# Patient Record
Sex: Male | Born: 1955 | Race: White | Hispanic: No | Marital: Married | Smoking: Former smoker
Health system: Southern US, Community
[De-identification: ages and names within clinical notes are randomized; demographics above are authoritative.]

## PROBLEM LIST (undated history)

## (undated) DIAGNOSIS — R059 Cough, unspecified: Secondary | ICD-10-CM

## (undated) DIAGNOSIS — F419 Anxiety disorder, unspecified: Secondary | ICD-10-CM

## (undated) DIAGNOSIS — M1990 Unspecified osteoarthritis, unspecified site: Secondary | ICD-10-CM

## (undated) DIAGNOSIS — M542 Cervicalgia: Secondary | ICD-10-CM

## (undated) DIAGNOSIS — I10 Essential (primary) hypertension: Secondary | ICD-10-CM

## (undated) DIAGNOSIS — K219 Gastro-esophageal reflux disease without esophagitis: Secondary | ICD-10-CM

## (undated) DIAGNOSIS — Z79891 Long term (current) use of opiate analgesic: Secondary | ICD-10-CM

## (undated) DIAGNOSIS — F329 Major depressive disorder, single episode, unspecified: Secondary | ICD-10-CM

## (undated) DIAGNOSIS — G5600 Carpal tunnel syndrome, unspecified upper limb: Secondary | ICD-10-CM

## (undated) DIAGNOSIS — K635 Polyp of colon: Secondary | ICD-10-CM

## (undated) DIAGNOSIS — M5126 Other intervertebral disc displacement, lumbar region: Secondary | ICD-10-CM

## (undated) DIAGNOSIS — R05 Cough: Secondary | ICD-10-CM

## (undated) DIAGNOSIS — C801 Malignant (primary) neoplasm, unspecified: Secondary | ICD-10-CM

## (undated) DIAGNOSIS — R42 Dizziness and giddiness: Secondary | ICD-10-CM

## (undated) DIAGNOSIS — Z978 Presence of other specified devices: Secondary | ICD-10-CM

## (undated) DIAGNOSIS — F32A Depression, unspecified: Secondary | ICD-10-CM

## (undated) DIAGNOSIS — Z79890 Hormone replacement therapy (postmenopausal): Secondary | ICD-10-CM

## (undated) DIAGNOSIS — I499 Cardiac arrhythmia, unspecified: Secondary | ICD-10-CM

## (undated) DIAGNOSIS — G709 Myoneural disorder, unspecified: Secondary | ICD-10-CM

## (undated) DIAGNOSIS — E039 Hypothyroidism, unspecified: Secondary | ICD-10-CM

## (undated) DIAGNOSIS — G8929 Other chronic pain: Secondary | ICD-10-CM

## (undated) DIAGNOSIS — E119 Type 2 diabetes mellitus without complications: Secondary | ICD-10-CM

## (undated) HISTORY — PX: VASECTOMY: SHX75

## (undated) HISTORY — PX: COLON SURGERY: SHX602

## (undated) HISTORY — DX: Polyp of colon: K63.5

## (undated) HISTORY — PX: HERNIA REPAIR: SHX51

## (undated) HISTORY — DX: Presence of other specified devices: Z97.8

## (undated) HISTORY — PX: IMAGE GUIDED SINUS SURGERY: SHX6570

## (undated) HISTORY — DX: Malignant (primary) neoplasm, unspecified: C80.1

---

## 1994-12-02 DIAGNOSIS — G8929 Other chronic pain: Secondary | ICD-10-CM

## 1994-12-02 HISTORY — DX: Other chronic pain: G89.29

## 2011-12-03 HISTORY — PX: LAPAROSCOPIC CHOLECYSTECTOMY: SUR755

## 2011-12-03 HISTORY — PX: UMBILICAL HERNIA REPAIR: SHX196

## 2011-12-03 HISTORY — PX: COLONOSCOPY: SHX174

## 2014-12-02 HISTORY — PX: OTHER SURGICAL HISTORY: SHX169

## 2016-10-17 DIAGNOSIS — I10 Essential (primary) hypertension: Secondary | ICD-10-CM | POA: Insufficient documentation

## 2016-10-17 DIAGNOSIS — K635 Polyp of colon: Secondary | ICD-10-CM | POA: Insufficient documentation

## 2016-10-17 DIAGNOSIS — E039 Hypothyroidism, unspecified: Secondary | ICD-10-CM | POA: Insufficient documentation

## 2016-10-17 DIAGNOSIS — E1142 Type 2 diabetes mellitus with diabetic polyneuropathy: Secondary | ICD-10-CM | POA: Insufficient documentation

## 2016-10-17 DIAGNOSIS — G894 Chronic pain syndrome: Secondary | ICD-10-CM | POA: Insufficient documentation

## 2017-04-21 ENCOUNTER — Encounter: Payer: Self-pay | Admitting: *Deleted

## 2017-04-22 ENCOUNTER — Ambulatory Visit: Payer: Federal, State, Local not specified - PPO | Admitting: Certified Registered Nurse Anesthetist

## 2017-04-22 ENCOUNTER — Encounter
Admission: RE | Disposition: A | Discharge status: Home / Self Care | Payer: Self-pay | Source: Ambulatory Visit | Attending: Gastroenterology

## 2017-04-22 ENCOUNTER — Ambulatory Visit
Admission: RE | Admit: 2017-04-22 | Discharge: 2017-04-22 | Disposition: A | Discharge status: Home / Self Care | Payer: Federal, State, Local not specified - PPO | Source: Ambulatory Visit | Attending: Gastroenterology | Admitting: Gastroenterology

## 2017-04-22 ENCOUNTER — Encounter: Payer: Self-pay | Admitting: Anesthesiology

## 2017-04-22 DIAGNOSIS — F329 Major depressive disorder, single episode, unspecified: Secondary | ICD-10-CM | POA: Diagnosis not present

## 2017-04-22 DIAGNOSIS — D122 Benign neoplasm of ascending colon: Secondary | ICD-10-CM | POA: Insufficient documentation

## 2017-04-22 DIAGNOSIS — F419 Anxiety disorder, unspecified: Secondary | ICD-10-CM | POA: Insufficient documentation

## 2017-04-22 DIAGNOSIS — D125 Benign neoplasm of sigmoid colon: Secondary | ICD-10-CM | POA: Diagnosis not present

## 2017-04-22 DIAGNOSIS — D124 Benign neoplasm of descending colon: Secondary | ICD-10-CM | POA: Insufficient documentation

## 2017-04-22 DIAGNOSIS — K219 Gastro-esophageal reflux disease without esophagitis: Secondary | ICD-10-CM | POA: Diagnosis not present

## 2017-04-22 DIAGNOSIS — E119 Type 2 diabetes mellitus without complications: Secondary | ICD-10-CM | POA: Insufficient documentation

## 2017-04-22 DIAGNOSIS — Z87891 Personal history of nicotine dependence: Secondary | ICD-10-CM | POA: Diagnosis not present

## 2017-04-22 DIAGNOSIS — M1990 Unspecified osteoarthritis, unspecified site: Secondary | ICD-10-CM | POA: Insufficient documentation

## 2017-04-22 DIAGNOSIS — D127 Benign neoplasm of rectosigmoid junction: Secondary | ICD-10-CM | POA: Insufficient documentation

## 2017-04-22 DIAGNOSIS — Z1211 Encounter for screening for malignant neoplasm of colon: Secondary | ICD-10-CM | POA: Insufficient documentation

## 2017-04-22 DIAGNOSIS — K5730 Diverticulosis of large intestine without perforation or abscess without bleeding: Secondary | ICD-10-CM | POA: Insufficient documentation

## 2017-04-22 DIAGNOSIS — I499 Cardiac arrhythmia, unspecified: Secondary | ICD-10-CM | POA: Diagnosis not present

## 2017-04-22 DIAGNOSIS — Z86010 Personal history of colonic polyps: Secondary | ICD-10-CM | POA: Diagnosis not present

## 2017-04-22 DIAGNOSIS — Z79899 Other long term (current) drug therapy: Secondary | ICD-10-CM | POA: Insufficient documentation

## 2017-04-22 DIAGNOSIS — Z881 Allergy status to other antibiotic agents status: Secondary | ICD-10-CM | POA: Insufficient documentation

## 2017-04-22 DIAGNOSIS — K644 Residual hemorrhoidal skin tags: Secondary | ICD-10-CM | POA: Insufficient documentation

## 2017-04-22 DIAGNOSIS — G8929 Other chronic pain: Secondary | ICD-10-CM | POA: Insufficient documentation

## 2017-04-22 DIAGNOSIS — Z880 Allergy status to penicillin: Secondary | ICD-10-CM | POA: Diagnosis not present

## 2017-04-22 DIAGNOSIS — K648 Other hemorrhoids: Secondary | ICD-10-CM | POA: Insufficient documentation

## 2017-04-22 DIAGNOSIS — Z794 Long term (current) use of insulin: Secondary | ICD-10-CM | POA: Diagnosis not present

## 2017-04-22 DIAGNOSIS — D123 Benign neoplasm of transverse colon: Secondary | ICD-10-CM | POA: Insufficient documentation

## 2017-04-22 HISTORY — DX: Unspecified osteoarthritis, unspecified site: M19.90

## 2017-04-22 HISTORY — DX: Anxiety disorder, unspecified: F41.9

## 2017-04-22 HISTORY — PX: COLONOSCOPY WITH PROPOFOL: SHX5780

## 2017-04-22 HISTORY — DX: Cardiac arrhythmia, unspecified: I49.9

## 2017-04-22 HISTORY — DX: Type 2 diabetes mellitus without complications: E11.9

## 2017-04-22 HISTORY — DX: Depression, unspecified: F32.A

## 2017-04-22 HISTORY — DX: Gastro-esophageal reflux disease without esophagitis: K21.9

## 2017-04-22 HISTORY — DX: Major depressive disorder, single episode, unspecified: F32.9

## 2017-04-22 HISTORY — DX: Other chronic pain: G89.29

## 2017-04-22 LAB — GLUCOSE, CAPILLARY: Glucose-Capillary: 155 mg/dL — ABNORMAL HIGH (ref 65–99)

## 2017-04-22 SURGERY — COLONOSCOPY WITH PROPOFOL
Anesthesia: General

## 2017-04-22 MED ORDER — SODIUM CHLORIDE 0.9 % IV SOLN: INTRAVENOUS | Status: DC

## 2017-04-22 MED ORDER — PROPOFOL 10 MG/ML IV BOLUS
INTRAVENOUS | Status: AC
  Filled 2017-04-22: qty 20

## 2017-04-22 MED ORDER — PROPOFOL 500 MG/50ML IV EMUL
INTRAVENOUS | Status: DC | PRN
  Administered 2017-04-22: 140 ug/kg/min via INTRAVENOUS

## 2017-04-22 MED ORDER — MIDAZOLAM HCL 2 MG/2ML IJ SOLN
INTRAMUSCULAR | Status: DC | PRN
  Administered 2017-04-22: 2 mg via INTRAVENOUS

## 2017-04-22 MED ORDER — PHENYLEPHRINE HCL 10 MG/ML IJ SOLN
INTRAMUSCULAR | Status: AC
  Filled 2017-04-22: qty 1

## 2017-04-22 MED ORDER — LIDOCAINE HCL (CARDIAC) 20 MG/ML IV SOLN
INTRAVENOUS | Status: DC | PRN
  Administered 2017-04-22: 30 mg via INTRAVENOUS

## 2017-04-22 MED ORDER — EPHEDRINE SULFATE 50 MG/ML IJ SOLN
INTRAMUSCULAR | Status: AC
  Filled 2017-04-22: qty 1

## 2017-04-22 MED ORDER — LIDOCAINE HCL 2 % IJ SOLN
INTRAMUSCULAR | Status: AC
  Filled 2017-04-22: qty 10

## 2017-04-22 MED ORDER — PHENYLEPHRINE HCL 10 MG/ML IJ SOLN
INTRAMUSCULAR | Status: DC | PRN
  Administered 2017-04-22: 200 ug via INTRAVENOUS
  Administered 2017-04-22 (×2): 100 ug via INTRAVENOUS
  Administered 2017-04-22: 200 ug via INTRAVENOUS
  Administered 2017-04-22: 100 ug via INTRAVENOUS

## 2017-04-22 MED ORDER — SPOT INK MARKER SYRINGE KIT
PACK | SUBMUCOSAL | Status: DC | PRN
  Administered 2017-04-22: 5 mL via SUBMUCOSAL

## 2017-04-22 MED ORDER — MIDAZOLAM HCL 2 MG/2ML IJ SOLN
INTRAMUSCULAR | Status: AC
  Filled 2017-04-22: qty 2

## 2017-04-22 MED ORDER — PROPOFOL 10 MG/ML IV BOLUS
INTRAVENOUS | Status: DC | PRN
  Administered 2017-04-22: 30 mg via INTRAVENOUS

## 2017-04-22 MED ORDER — SODIUM CHLORIDE 0.9 % IV SOLN
INTRAVENOUS | Status: DC
  Administered 2017-04-22: 07:00:00 via INTRAVENOUS

## 2017-04-22 MED ORDER — PROPOFOL 500 MG/50ML IV EMUL
INTRAVENOUS | Status: AC
  Filled 2017-04-22: qty 50

## 2017-04-22 NOTE — OR Nursing (Signed)
6 ML [place] Ink placed is descending colon to mark Polyp site.

## 2017-04-22 NOTE — Anesthesia Postprocedure Evaluation (Signed)
Anesthesia Post Note  Patient: Stephen Yu  Procedure(s) Performed: Procedure(s) (LRB): COLONOSCOPY WITH PROPOFOL (N/A)  Patient location during evaluation: Endoscopy Anesthesia Type: General Level of consciousness: awake and alert Pain management: pain level controlled Vital Signs Assessment: post-procedure vital signs reviewed and stable Respiratory status: spontaneous breathing, nonlabored ventilation, respiratory function stable and patient connected to nasal cannula oxygen Cardiovascular status: blood pressure returned to baseline and stable Postop Assessment: no signs of nausea or vomiting Anesthetic complications: no     Last Vitals:  Vitals:   04/22/17 0900 04/22/17 0908  BP:  113/73  Pulse: 65 64  Resp: (!) 8 14  Temp:      Last Pain:  Vitals:   04/22/17 0830  TempSrc: Tympanic  PainSc:                  [name] S

## 2017-04-22 NOTE — Anesthesia Post-op Follow-up Note (Cosign Needed)
Anesthesia QCDR form completed.        

## 2017-04-22 NOTE — Anesthesia Procedure Notes (Addendum)
Date/Time: 04/22/2017 7:35 AM Performed by: Johnna Acosta Pre-anesthesia Checklist: Patient identified, Emergency Drugs available, Suction available, Patient being monitored and Timeout performed Patient Re-evaluated:Patient Re-evaluated prior to inductionOxygen Delivery Method: Nasal cannula

## 2017-04-22 NOTE — Anesthesia Preprocedure Evaluation (Signed)
Anesthesia Evaluation  Patient identified by MRN, date of birth, ID band Patient awake    Reviewed: Allergy & Precautions, NPO status , Patient's Chart, lab work & pertinent test results, reviewed documented beta blocker date and time   Airway Mallampati: II  TM Distance: >3 FB     Dental  (+) Chipped   Pulmonary former smoker,           Cardiovascular      Neuro/Psych PSYCHIATRIC DISORDERS Anxiety Depression    GI/Hepatic GERD  ,  Endo/Other  diabetes, Type 2  Renal/GU      Musculoskeletal   Abdominal   Peds  Hematology   Anesthesia Other Findings   Reproductive/Obstetrics                             Anesthesia Physical Anesthesia Plan  ASA: III  Anesthesia Plan: General   Post-op Pain Management:    Induction: Intravenous  Airway Management Planned:   Additional Equipment:   Intra-op Plan:   Post-operative Plan:   Informed Consent: I have reviewed the patients History and Physical, chart, labs and discussed the procedure including the risks, benefits and alternatives for the proposed anesthesia with the patient or authorized representative who has indicated his/her understanding and acceptance.     Plan Discussed with: CRNA  Anesthesia Plan Comments:         Anesthesia Quick Evaluation

## 2017-04-22 NOTE — H&P (Signed)
Outpatient short stay form Pre-procedure 04/22/2017 7:27 AM Lollie Sails MD  Primary Physician: Dr. Fulton Reek  Reason for visit:  Colonoscopy  History of present illness:  Patient is a 61 year old male presenting today as above. He tolerated his prep well. He takes no recent aspirin products. He does not take a prescribed blood thinner. Patient does have a personal history of adenomatous colon polyps.    Current Facility-Administered Medications:  .  0.9 %  sodium chloride infusion, , Intravenous, Continuous, Lollie Sails, MD, Last Rate: 20 mL/hr at 04/22/17 0716 .  0.9 %  sodium chloride infusion, , Intravenous, Continuous, Lollie Sails, MD  Prescriptions Prior to Admission  Medication Sig Dispense Refill Last Dose  . amitriptyline (ELAVIL) 100 MG tablet Take 100 mg by mouth at bedtime.   04/07/2017  . citalopram (CELEXA) 40 MG tablet Take 40 mg by mouth daily.   04/20/2017  . esomeprazole (NEXIUM) 40 MG capsule Take 40 mg by mouth daily at 12 noon.   03/23/2017  . glyBURIDE-metformin (GLUCOVANCE) 5-500 MG tablet Take 1 tablet by mouth daily with breakfast.   04/20/2017  . HYDROcodone-acetaminophen (NORCO) 7.5-325 MG tablet Take 1 tablet by mouth every 6 (six) hours as needed for moderate pain.   04/21/2017  . Insulin Glargine (BASAGLAR KWIKPEN) 100 UNIT/ML SOPN Inject into the skin at bedtime.   04/21/2017 at Unknown time  . lisinopril-hydrochlorothiazide (PRINZIDE,ZESTORETIC) 20-25 MG tablet Take 1 tablet by mouth daily.   04/20/2017  . niacin 500 MG tablet Take 500 mg by mouth at bedtime.   04/20/2017  . omega-3 acid ethyl esters (LOVAZA) 1 g capsule Take 1 g by mouth 2 (two) times daily.     . pregabalin (LYRICA) 150 MG capsule Take 150 mg by mouth 2 (two) times daily.   04/17/2017  . THYROID, PORK, PO Take 32.5 mg by mouth daily.   04/17/2017  . tiZANidine (ZANAFLEX) 4 MG capsule Take 4 mg by mouth 3 (three) times daily.   04/20/2017  . vitamin E (VITAMIN E) 400 UNIT  capsule Take 400 Units by mouth daily.   04/17/2017  . zinc gluconate 50 MG tablet Take 50 mg by mouth daily.   04/17/2017     Allergies  Allergen Reactions  . Biaxin [Clarithromycin]   . Cephalexin   . Penicillins      Past Medical History:  Diagnosis Date  . Anxiety   . Arthritis   . Chronic pain   . Depression   . Diabetes mellitus without complication ([place])   . Dysrhythmia   . GERD (gastroesophageal reflux disease)     Review of systems:      Physical Exam    Heart and lungs: Regular rate and rhythm without rub or gallop, lungs are bilaterally clear.    HEENT: Normocephalic atraumatic eyes are anicteric    Other:     Pertinant exam for procedure: Soft nontender nondistended bowel sounds positive normoactive.    Planned proceedures: Colonoscopy and indicated procedures. I have discussed the risks benefits and complications of procedures to include not limited to bleeding, infection, perforation and the risk of sedation and the patient wishes to proceed.  Lollie Sails, MD Gastroenterology 04/22/2017  7:27 AM

## 2017-04-22 NOTE — Transfer of Care (Signed)
Immediate Anesthesia Transfer of Care Note  Patient: Stephen Yu  Procedure(s) Performed: Procedure(s): COLONOSCOPY WITH PROPOFOL (N/A)  Patient Location: PACU  Anesthesia Type:General  Level of Consciousness: sedated  Airway & Oxygen Therapy: Patient Spontanous Breathing and Patient connected to nasal cannula oxygen  Post-op Assessment: Report given to RN and Post -op Vital signs reviewed and stable  Post vital signs: Reviewed and stable  Last Vitals:  Vitals:   04/22/17 0830 04/22/17 0838  BP:  (!) 99/51  Pulse:  63  Resp:  (!) 8  Temp: 36.2 C     Last Pain:  Vitals:   04/22/17 0830  TempSrc: Tympanic  PainSc:          Complications: No apparent anesthesia complications

## 2017-04-22 NOTE — Op Note (Addendum)
[facility] Patient Name: Stephen Yu Procedure Date: 04/22/2017 7:32 AM MRN: [id number] Account #: [id number] Date of Birth: 04/25/1956 Admit Type: Outpatient Age: 61 Room: [facility] ENDO ROOM 3 Gender: Male Note Status: Finalized Procedure:            Colonoscopy Indications:          Personal history of colonic polyps Providers:            Lollie Sails, MD Referring MD:         Leonie Douglas. Doy Hutching, MD (Referring MD) Medicines:            Monitored Anesthesia Care Complications:        No immediate complications. Procedure:            Pre-Anesthesia Assessment:                       - ASA Grade Assessment: III - A patient with severe                        systemic disease.                       After obtaining informed consent, the colonoscope was                        passed under direct vision. Throughout the procedure,                        the patient's blood pressure, pulse, and oxygen                        saturations were monitored continuously. The                        Colonoscope was introduced through the anus and                        advanced to the the cecum, identified by appendiceal                        orifice and ileocecal valve. The colonoscopy was                        performed with moderate difficulty due to poor bowel                        prep. Successful completion of the procedure was aided                        by lavage. The colonoscopy was performed with moderate                        difficulty due to significant looping. Successful                        completion of the procedure was aided by changing the                        patient to a supine position and changing the patient  to a prone position. The quality of the bowel                        preparation was fair. Findings:      Multiple small and large-mouthed diverticula were found in the sigmoid       colon and  descending colon.      Six sessile polyps were found in the recto-sigmoid colon. The polyps       were 1 to 3 mm in size. These polyps were removed with a cold biopsy       forceps. Resection and retrieval were complete.      Two sessile polyps were found in the transverse colon. The polyps were 4       mm in size. These polyps were removed with a cold snare. Resection and       retrieval were complete.      A 4 mm polyp was found in the ascending colon. The polyp was sessile.       The polyp was removed with a cold snare. Resection and retrieval were       complete.      A localized area of granular mucosa was found at the ileocecal valve.       Biopsies were taken with a cold forceps for histology.      A greater than 50 mm polyp was found in the proximal transverse colon.       The polyp was sessile. Biopsies were taken with a cold forceps for       histology. Area was tattooed with an injection of 4 mL of [place] ink.       Review of phots after the case indicated possible endoloop/band at the       site.      A 6 mm polyp was found in the descending colon. The polyp was sessile.       The polyp was removed with a cold snare. Resection and retrieval were       complete.      A 4 mm polyp was found in the distal descending colon. The polyp was       sessile. The polyp was removed with a cold snare. Resection and       retrieval were complete.      A 2 mm polyp was found in the proximal sigmoid colon. The polyp was       sessile. The polyp was removed with a cold biopsy forceps. Resection and       retrieval were complete.      The digital rectal exam was normal.      The retroflexed view of the distal rectum and anal verge was normal and       showed no anal or rectal abnormalities.      The digital rectal exam was normal.      Non-bleeding external and internal hemorrhoids were found during       anoscopy. The hemorrhoids were small. Impression:           - Preparation of the colon  was fair.                       - Diverticulosis in the sigmoid colon and in the                        descending colon.                       -  Six 1 to 3 mm polyps at the recto-sigmoid colon,                        removed with a cold biopsy forceps. Resected and                        retrieved.                       - Two 4 mm polyps in the transverse colon, removed with                        a cold snare. Resected and retrieved.                       - One 4 mm polyp in the ascending colon, removed with a                        cold snare. Resected and retrieved.                       - Granularity at the ileocecal valve. Biopsied.                       - One greater than 50 mm polyp in the proximal                        transverse colon. Biopsied. Tattooed.                       - One 6 mm polyp in the descending colon, removed with                        a cold snare. Resected and retrieved.                       - One 4 mm polyp in the distal descending colon,                        removed with a cold snare. Resected and retrieved.                       - One 2 mm polyp in the proximal sigmoid colon, removed                        with a cold biopsy forceps. Resected and retrieved.                       - The distal rectum and anal verge are normal on                        retroflexion view.                       - Non-bleeding external and internal hemorrhoids. Recommendation:       - Await pathology results.                       - Return to GI clinic in 2 weeks.                       -  depending on histology, may need surgical                        consultation. Procedure Code(s):    --- Professional ---                       [phone], Colonoscopy, flexible; with removal of tumor(s),                        polyp(s), or other lesion(s) by snare technique                       45380, 80, Colonoscopy, flexible; with biopsy, single                        or multiple                        45381, Colonoscopy, flexible; with directed submucosal                        injection(s), any substance Diagnosis Code(s):    --- Professional ---                       K64.8, Other hemorrhoids                       D12.7, Benign neoplasm of rectosigmoid junction                       D12.3, Benign neoplasm of transverse colon (hepatic                        flexure or splenic flexure)                       D12.2, Benign neoplasm of ascending colon                       D12.4, Benign neoplasm of descending colon                       D12.5, Benign neoplasm of sigmoid colon                       K63.89, Other specified diseases of intestine                       Z86.010, Personal history of colonic polyps                       K57.30, Diverticulosis of large intestine without                        perforation or abscess without bleeding CPT copyright 2016 American Medical Association. All rights reserved. The codes documented in this report are preliminary and upon coder review may  be revised to meet current compliance requirements. Lollie Sails, MD 04/22/2017 8:43:15 AM This report has been signed electronically. Number of Addenda: 0 Note Initiated On: 04/22/2017 7:32 AM Scope Withdrawal Time: 0 hours 34 minutes 57 seconds  Total Procedure Duration: 0 hours 52 minutes 53 seconds       [facility]

## 2017-04-23 LAB — SURGICAL PATHOLOGY

## 2017-04-24 ENCOUNTER — Encounter: Payer: Self-pay | Admitting: Gastroenterology

## 2017-04-29 ENCOUNTER — Other Ambulatory Visit: Payer: Self-pay | Admitting: Neurological Surgery

## 2017-04-30 DIAGNOSIS — J351 Hypertrophy of tonsils: Secondary | ICD-10-CM | POA: Insufficient documentation

## 2017-05-07 ENCOUNTER — Ambulatory Visit (INDEPENDENT_AMBULATORY_CARE_PROVIDER_SITE_OTHER): Payer: Federal, State, Local not specified - PPO | Admitting: General Surgery

## 2017-05-07 ENCOUNTER — Encounter: Payer: Self-pay | Admitting: General Surgery

## 2017-05-07 VITALS — BP 132/72 | HR 82 | Resp 12 | Ht 70.0 in | Wt 226.0 lb

## 2017-05-07 DIAGNOSIS — K429 Umbilical hernia without obstruction or gangrene: Secondary | ICD-10-CM | POA: Diagnosis not present

## 2017-05-07 DIAGNOSIS — D123 Benign neoplasm of transverse colon: Secondary | ICD-10-CM | POA: Diagnosis not present

## 2017-05-07 DIAGNOSIS — D369 Benign neoplasm, unspecified site: Secondary | ICD-10-CM

## 2017-05-07 NOTE — Progress Notes (Signed)
Patient ID: Stephen Yu, male   DOB: October 24, 1956, 61 y.o.   MRN: [id number]  Chief Complaint  Patient presents with  . Colon Polyps    HPI Stephen Yu is a 61 y.o. male.  Here today referred by Dr Donnella Sham for transverse colon polyps. Last colonoscopy was 04-22-17. Bowels area regular, no bleeding. He does have back surgery for spurs scheduled for 05-28-17 with Dr. Sherley Bounds in [place]. Intrathecal pump followed by Dr. Mechele Dawley in [place], [phone]. Dr. Elana Alm in [facility] in [place], [place] or [phone].  HPI  Past Medical History:  Diagnosis Date  . Anxiety   . Arthritis   . Chronic pain 1996   due to work injury  . Colon polyp   . Depression   . Diabetes mellitus without complication ([place])   . Dysrhythmia   . GERD (gastroesophageal reflux disease)   . Presence of intrathecal pump    Dr. Mechele Dawley in [place], [place]    Past Surgical History:  Procedure Laterality Date  . COLONOSCOPY  2013   [place]  . COLONOSCOPY WITH PROPOFOL N/A 04/22/2017   Procedure: COLONOSCOPY WITH PROPOFOL;  Surgeon: Lollie Sails, MD;  Location: [facility] ENDOSCOPY;  Service: Endoscopy;  Laterality: N/A;  . implanted morphine pump  2016  . LAPAROSCOPIC CHOLECYSTECTOMY  2013  . UMBILICAL HERNIA REPAIR  2013    Family History  Problem Relation Age of Onset  . Breast cancer Cousin 42  . Cancer - Colon Neg Hx   . Prostate cancer Neg Hx     Social History Social History  Substance Use Topics  . Smoking status: Former Smoker    Years: 20.00    Types: Cigarettes    Quit date: 12/03/1999  . Smokeless tobacco: Never Used  . Alcohol use No    Allergies  Allergen Reactions  . Biaxin [Clarithromycin] Other (See Comments)    Severe hiccups  . Cephalexin Other (See Comments)    Severe hiccups  . Penicillins Hives    Current Outpatient Prescriptions  Medication Sig Dispense Refill  . amitriptyline (ELAVIL) 100 MG tablet  Take 100 mg by mouth at bedtime as needed.     . Cholecalciferol (VITAMIN D3) 5000 units TABS Take by mouth.    . citalopram (CELEXA) 40 MG tablet Take 40 mg by mouth daily.    Marland Kitchen esomeprazole (NEXIUM) 40 MG capsule Take 40 mg by mouth as needed.     . glyBURIDE-metformin (GLUCOVANCE) 5-500 MG tablet Take 1 tablet by mouth daily with breakfast.    . HYDROcodone-acetaminophen (NORCO) 7.5-325 MG tablet Take 1 tablet by mouth every 6 (six) hours as needed for moderate pain.    . Insulin Glargine (BASAGLAR KWIKPEN) 100 UNIT/ML SOPN Inject 40 Units into the skin daily.     Marland Kitchen KRILL OIL PO Take by mouth daily.    Marland Kitchen lisinopril-hydrochlorothiazide (PRINZIDE,ZESTORETIC) 20-25 MG tablet Take 1 tablet by mouth daily.    . niacin 500 MG tablet Take 500 mg by mouth at bedtime.    Marland Kitchen omega-3 acid ethyl esters (LOVAZA) 1 g capsule Take 1 g by mouth 2 (two) times daily.    . pregabalin (LYRICA) 150 MG capsule Take 150 mg by mouth 2 (two) times daily.    . THYROID, PORK, PO Take 32.5 mg by mouth daily.    Marland Kitchen tiZANidine (ZANAFLEX) 4 MG capsule Take 4 mg by mouth 3 (three) times daily.    . vitamin E (VITAMIN E) 400  UNIT capsule Take 400 Units by mouth daily.    Marland Kitchen zinc gluconate 50 MG tablet Take 50 mg by mouth daily.     No current facility-administered medications for this visit.     Review of Systems Review of Systems  Constitutional: Negative.   Respiratory: Negative.   Cardiovascular: Negative.   Gastrointestinal: Negative for abdominal pain, blood in stool, constipation and diarrhea.    Blood pressure 132/72, pulse 82, resp. rate 12, height 5\' 10"  (1.778 m), weight 226 lb (102.5 kg).  Physical Exam Physical Exam  Constitutional: He is oriented to person, place, and time. He appears well-developed and well-nourished.  Eyes: Conjunctivae are normal. No scleral icterus.  Neck: Neck supple.  Cardiovascular: Normal rate, regular rhythm, normal heart sounds and intact distal pulses.   No lower leg edema   Pulmonary/Chest: Effort normal and breath sounds normal.  Abdominal: Soft. Normal appearance and bowel sounds are normal. A hernia is present.    Lymphadenopathy:    He has no cervical adenopathy.  Neurological: He is alert and oriented to person, place, and time.  Skin: Skin is warm.  Psychiatric: He has a normal mood and affect. His behavior is normal.    Data Reviewed Notes and images reviewed.   Assessment    History of Adenomatous colon polyps- Approx 12 polyps found in most recent colonoscopy done on 04/22/2017. One large polyp in transverse colon not amenable to removal by colonoscope. Biopsy of this showed tubular adenoma, no dysplasia  Will consult with Anesthesia at [facility] to discuss impact of  intrathecal pump intra-operatively. Lap colectomy recommended. Chronic pain- has intrathecal pump, managed by Dr. Mechele Dawley at [facility] One episode of A.fib- stable since Stable exam otherwise    Plan    Options reviewed with patients, patient agreeable with plan, precautions, and risks. Will be scheduled for Laparoscopic Colectomy. Advised to call office with questions or concerns.     HPI, Physical Exam, Assessment and Plan have been scribed under the direction and in the presence of Mckinley Jewel, MD  Karie Fetch, RN  I have completed the exam and reviewed the above documentation for accuracy and completeness.  I agree with the above.  Haematologist has been used and any errors in dictation or transcription are unintentional.  Seeplaputhur G. Jamal Collin, M.D., F.A.C.S.   Junie Panning G 05/08/2017, 9:20 AM

## 2017-05-07 NOTE — Patient Instructions (Addendum)
Options reviewed with patients, patient agreeable with plan, precautions, and risks. Will be scheduled for Laparoscopic Colectomy. Advised to call office with questions or concerns. Laparoscopic Colectomy Laparoscopic colectomy is surgery to remove part or all of the large intestine (colon). This procedure may be used to treat several conditions, including:  Inflammation and infection of the colon (diverticulitis).  Tumors or masses in the colon.  Inflammatory bowel disease, such as Crohn disease or ulcerative colitis. Colectomy is an option when symptoms cannot be controlled with medicines.  Bleeding from the colon that cannot be controlled by another method.  Blockage or obstruction of the colon.  Tell a health care provider about:  Any allergies you have.  All medicines you are taking, including vitamins, herbs, eye drops, creams, and over-the-counter medicines.  Any problems you or family members have had with anesthetic medicines.  Any blood disorders you have.  Any surgeries you have had.  Any medical conditions you have. What are the risks? Generally, this is a safe procedure. However, problems may occur, including:  Infection.  Bleeding.  Allergic reactions to medicines or dyes.  Damage to other structures or organs.  Leaking from where the colon was sewn together.  Future blockage of the small intestines from scar tissue. Another surgery may be needed to repair this.  Needing to convert to an open procedure. Complications such as damage to other organs or excessive bleeding may require the surgeon to convert from a laparoscopic procedure to an open procedure. This involves making a larger incision in the abdomen.  What happens before the procedure? Staying hydrated Follow instructions from your health care provider about hydration, which may include:  Up to 2 hours before the procedure - you may continue to drink clear liquids, such as water, clear fruit  juice, black coffee, and plain tea.  Eating and drinking restrictions Follow instructions from your health care provider about eating and drinking, which may include:  8 hours before the procedure - stop eating heavy meals, meals with high fiber, or foods such as meat, fried foods, or fatty foods.  6 hours before the procedure - stop eating light meals or foods, such as toast or cereal.  6 hours before the procedure - stop drinking milk or drinks that contain milk.  2 hours before the procedure - stop drinking clear liquids.  Medicines  Ask your health care provider about: ? Changing or stopping your regular medicines. This is especially important if you are taking diabetes medicines or blood thinners. ? Taking medicines such as aspirin and ibuprofen. These medicines can thin your blood. Do not take these medicines before your procedure if your health care provider instructs you not to.  You may be given antibiotic medicine to clean out bacteria from your colon. Follow the directions carefully and take the medicine at the correct time. General instructions  You may be prescribed an oral bowel prep to clean out your colon in preparation for the surgery: ? Follow instructions from your health care provider about how to do this. ? Do not eat or drink anything else after you have started the bowel prep, unless your health care provider tells you it is safe to do so.  Do not use any products that contain nicotine or tobacco, such as cigarettes and e-cigarettes. If you need help quitting, ask your health care provider. What happens during the procedure?  To reduce your risk of infection: ? Your health care team will wash or sanitize their hands. ? Your  skin will be washed with soap.  An IV tube will be inserted into one of your veins to deliver fluid and medication.  You will be given one of the following: ? A medicine to help you relax (sedative). ? A medicine to make you fall asleep  (general anesthetic).  Small monitors will be connected to your body. They will be used to check your heart, blood pressure, and oxygen level.  A breathing tube may be placed into your lungs during the procedure.  A thin, flexible tube (catheter) will be placed into your bladder to drain urine.  A tube may be placed through your nose and into your stomach to drain stomach fluids (nasogastric tube, or NG tube).  Your abdomen will be filled with air so it expands. This gives the surgeon more room to operate and makes your organs easier to see.  Several small cuts (incisions) will be made in your abdomen.  A thin, lighted tube with a tiny camera on the end (laparoscope) will be put through one of the small incisions. The camera on the laparoscope will send a picture to a computer screen in the operating room. This will give the surgeon a good view inside your abdomen.  Hollow tubes will be put through the other small incisions in your abdomen. The tools that are needed for the procedure will be put through these tubes.  Clamps or staples will be put on both ends of the diseased part of the colon.  The part of the intestine between the clamps or staples will be removed.  If possible, the ends of the healthy colon that remain will be stitched (sutured) or stapled together to allow your body to pass waste (stool).  Sometimes, the remaining colon cannot be stitched back together. If this is the case, a colostomy will be needed. If you need a colostomy: ? An opening to the outside of your body (stoma) will be made through your abdomen. ? The end of your colon will be brought to the opening. It will be stitched to the skin. ? A bag will be attached to the opening. Stool will drain into this removable bag. ? The colostomy may be temporary or permanent.  The incisions from the colectomy will be closed with sutures or staples. The procedure may vary among health care providers and hospitals. What  happens after the procedure?  Your blood pressure, heart rate, breathing rate, and blood oxygen level will be monitored until the medicines you were given have worn off.  You will receive fluids through an IV tube until your bowels start to work properly.  Once your bowels are working again, you will be given clear liquids first and then solid food as tolerated.  You will be given medicines to control your pain and nausea, if needed.  Do not drive for 24 hours if you were given a sedative. This information is not intended to replace advice given to you by your health care provider. Make sure you discuss any questions you have with your health care provider. Document Released: 02/08/2003 Document Revised: 08/19/2016 Document Reviewed: 08/19/2016 Elsevier Interactive Patient Education  [employer].

## 2017-05-13 ENCOUNTER — Telehealth: Payer: Self-pay

## 2017-05-13 MED ORDER — METRONIDAZOLE 500 MG PO TABS: 500.0000 mg | ORAL_TABLET | ORAL | 0 refills | Status: DC

## 2017-05-13 MED ORDER — NEOMYCIN SULFATE 500 MG PO TABS: 500.0000 mg | ORAL_TABLET | ORAL | 0 refills | Status: DC

## 2017-05-13 MED ORDER — POLYETHYLENE GLYCOL 3350 17 GM/SCOOP PO POWD: 1.0000 | Freq: Once | ORAL | 0 refills | Status: AC

## 2017-05-13 NOTE — Telephone Encounter (Signed)
Call to patient to review surgery instructions. The patient is scheduled for surgery at [facility] on 05/23/17. He will make use of Miralax bowel prep the night prior as well as scheduled antibiotics. He is aware to call the day before for his arrival time. The patient will pre admit by phone. He is aware of date and instructions.

## 2017-05-15 ENCOUNTER — Other Ambulatory Visit: Payer: Self-pay | Admitting: General Surgery

## 2017-05-15 DIAGNOSIS — D123 Benign neoplasm of transverse colon: Secondary | ICD-10-CM

## 2017-05-16 ENCOUNTER — Encounter
Admission: RE | Admit: 2017-05-16 | Discharge: 2017-05-16 | Disposition: A | Discharge status: Home / Self Care | Payer: Federal, State, Local not specified - PPO | Source: Ambulatory Visit | Attending: General Surgery | Admitting: General Surgery

## 2017-05-16 ENCOUNTER — Other Ambulatory Visit: Payer: Federal, State, Local not specified - PPO

## 2017-05-16 DIAGNOSIS — E119 Type 2 diabetes mellitus without complications: Secondary | ICD-10-CM | POA: Diagnosis not present

## 2017-05-16 DIAGNOSIS — I499 Cardiac arrhythmia, unspecified: Secondary | ICD-10-CM | POA: Insufficient documentation

## 2017-05-16 DIAGNOSIS — Z01812 Encounter for preprocedural laboratory examination: Secondary | ICD-10-CM | POA: Diagnosis present

## 2017-05-16 DIAGNOSIS — E039 Hypothyroidism, unspecified: Secondary | ICD-10-CM | POA: Insufficient documentation

## 2017-05-16 DIAGNOSIS — K635 Polyp of colon: Secondary | ICD-10-CM | POA: Insufficient documentation

## 2017-05-16 DIAGNOSIS — F419 Anxiety disorder, unspecified: Secondary | ICD-10-CM | POA: Diagnosis not present

## 2017-05-16 HISTORY — DX: Hypothyroidism, unspecified: E03.9

## 2017-05-16 LAB — SURGICAL PCR SCREEN
MRSA, PCR: NEGATIVE
Staphylococcus aureus: NEGATIVE

## 2017-05-16 NOTE — Patient Instructions (Signed)
Your procedure is scheduled on: Friday 05/23/17 Report to [place]. 2ND FLOOR MEDICAL MALL ENTRANCE. To find out your arrival time please call [phone] between 1PM - 3PM on Thursday 05/22/17.  Remember: Instructions that are not followed completely may result in serious medical risk, up to and including death, or upon the discretion of your surgeon and anesthesiologist your surgery may need to be rescheduled.    __X__ 1. Do not eat food or drink liquids after midnight. No gum chewing or hard candies.     __X__ 2. No Alcohol for 24 hours before or after surgery.   ____ 3. Bring all medications with you on the day of surgery if instructed.    __X__ 4. Notify your doctor if there is any change in your medical condition     (cold, fever, infections).             __X___5. No smoking within 24 hours of your surgery.     Do not wear jewelry, make-up, hairpins, clips or nail polish.  Do not wear lotions, powders, or perfumes.   Do not shave 48 hours prior to surgery. Men may shave face and neck.  Do not bring valuables to the hospital.    [facility] is not responsible for any belongings or valuables.               Contacts, dentures or bridgework may not be worn into surgery.  Leave your suitcase in the car. After surgery it may be brought to your room.  For patients admitted to the hospital, discharge time is determined by your                treatment team.   Patients discharged the day of surgery will not be allowed to drive home.   Please read over the following fact sheets that you were given:   MRSA Information   __X__ Take these medicines the morning of surgery with A SIP OF WATER:    1. CITALOPRAM  2. Dillard  3. LYRICA  4. ARMOUR THYROID  5.  6. PAIN MEDS IF NEEDED  ____ Fleet Enema (as directed)   __X__ Use CHG Soap as directed  ____ Use inhalers on the day of surgery  __X__ Stop metformin 2 days prior to surgery (GLUCOVANCE)   ____ Take 1/2 of usual insulin  dose the night before surgery and none on the morning of surgery.   ____ Stop Coumadin/Plavix/aspirin on   __X__ Stop Anti-inflammatories such as Advil, Aleve, Ibuprofen, Motrin, Naproxen, Naprosyn, Goodies,powder, or aspirin products.  OK to take Tylenol.   __X__ Stop supplements until after surgery.    ____ Bring C-Pap to the hospital.

## 2017-05-16 NOTE — Pre-Procedure Instructions (Signed)
  ECG 12-lead11/16/2017 [facility] Name Value Ref Range  Vent Rate (bpm) 68   PR Interval (msec) 146   QRS Interval (msec) 102   QT Interval (msec) 406   QTc (msec) 431   Result Narrative  Normal sinus rhythm Incomplete right bundle branch block Borderline ECG No previous ECGs available I reviewed and concur with this report. Electronically signed [id number] MD, JEFFREY ([id number]) on 11/19/2016 12:55:51 PM

## 2017-05-20 ENCOUNTER — Other Ambulatory Visit (HOSPITAL_COMMUNITY): Payer: Federal, State, Local not specified - PPO

## 2017-05-21 MED ORDER — BUPIVACAINE HCL (PF) 0.5 % IJ SOLN
INTRAMUSCULAR | Status: AC
  Filled 2017-05-21: qty 30

## 2017-05-21 MED ORDER — INDOCYANINE GREEN 25 MG IV SOLR
INTRAVENOUS | Status: AC
  Filled 2017-05-21: qty 25

## 2017-05-22 ENCOUNTER — Telehealth: Payer: Self-pay

## 2017-05-22 ENCOUNTER — Telehealth: Payer: Self-pay | Admitting: *Deleted

## 2017-05-22 NOTE — Telephone Encounter (Signed)
Patient came by the office today to report that he saw the PA at Dr. Doy Hutching office. He was diagnosed with an upper respiratory infection. Patient was told to wait 10 days before rescheduling surgery.   This patient was originally scheduled for a colectomy tomorrow, 05-23-17.  Dr. Jamal Collin notified. Assistant Arvilla Meres, RN notified.  Leah in the O.R. Notified.   We will wait to heat back from [facility] to see if she can assist Dr. Jamal Collin on 06-17-17 or 06-19-17. If not, we will plan for surgery on 06-20-17 with Dr. Bary Castilla to assist.

## 2017-05-22 NOTE — Telephone Encounter (Signed)
Patient called today and states that he woke up this morning with a cough and feeling horse. He denies any fever or chills at this time but does say his chest hurts from all of the coughing. He is going to try to get into see his primary care doctor this morning. Patient instructed to call back after lunch with an update. His his surgery for tomorrow may need to be rescheduled depending on what his primary doctor says.

## 2017-05-23 ENCOUNTER — Ambulatory Visit
Admission: RE | Admit: 2017-05-23 | Payer: Federal, State, Local not specified - PPO | Source: Ambulatory Visit | Admitting: General Surgery

## 2017-05-23 ENCOUNTER — Encounter: Admission: RE | Payer: Self-pay | Source: Ambulatory Visit

## 2017-05-23 SURGERY — LAPAROSCOPIC PARTIAL COLECTOMY
Anesthesia: Choice

## 2017-05-26 NOTE — Telephone Encounter (Signed)
Patient's surgery has been rescheduled for 06-17-17 at [facility]. This patient is aware.   Arvilla Meres, RN notified.   O.R. Notified.

## 2017-05-28 ENCOUNTER — Ambulatory Visit: Admit: 2017-05-28 | Payer: Federal, State, Local not specified - PPO | Admitting: Neurological Surgery

## 2017-05-28 SURGERY — ANTERIOR CERVICAL DECOMPRESSION/DISCECTOMY FUSION 2 LEVELS
Anesthesia: General | Laterality: Right

## 2017-06-09 ENCOUNTER — Encounter
Admission: RE | Admit: 2017-06-09 | Discharge: 2017-06-09 | Disposition: A | Discharge status: Home / Self Care | Payer: Federal, State, Local not specified - PPO | Source: Ambulatory Visit | Attending: General Surgery | Admitting: General Surgery

## 2017-06-09 HISTORY — DX: Essential (primary) hypertension: I10

## 2017-06-09 HISTORY — DX: Cough: R05

## 2017-06-09 HISTORY — DX: Cervicalgia: M54.2

## 2017-06-09 HISTORY — DX: Cough, unspecified: R05.9

## 2017-06-09 HISTORY — DX: Other intervertebral disc displacement, lumbar region: M51.26

## 2017-06-09 NOTE — Patient Instructions (Signed)
  Your procedure is scheduled on: 06-17-17  Report to [facility] Day Surgery 2nd floor medical mall [facility] Entrance-take elevator on left to 2nd floor.  Check in with surgery information desk.) To find out your arrival time please call [phone] between 1PM - 3PM on 06-16-17  Remember: Instructions that are not followed completely may result in serious medical risk, up to and including death, or upon the discretion of your surgeon and anesthesiologist your surgery may need to be rescheduled.    _x___ 1. Do not eat food or drink liquids after midnight. No gum chewing or   hard candies.     __x__ 2. No Alcohol for 24 hours before or after surgery.   __x__3. No Smoking for 24 prior to surgery.   ____  4. Bring all medications with you on the day of surgery if instructed.    __x__ 5. Notify your doctor if there is any change in your medical condition     (cold, fever, infections).     Do not wear jewelry, make-up, hairpins, clips or nail polish.  Do not wear lotions, powders, or perfumes. You may wear deodorant.  Do not shave 48 hours prior to surgery. Men may shave face and neck.  Do not bring valuables to the hospital.    [facility] is not responsible for any belongings or valuables.               Contacts, dentures or bridgework may not be worn into surgery.  Leave your suitcase in the car. After surgery it may be brought to your room.  For patients admitted to the hospital, discharge time is determined by your                       treatment team.   Patients discharged the day of surgery will not be allowed to drive home.  You will need someone to drive you home and stay with you the night of your procedure.    Please read over the following fact sheets that you were given:   [facility] Preparing for Surgery and or MRSA Information   _x___ Take anti-hypertensive (unless it includes a diuretic), cardiac, seizure, asthma,     anti-reflux and psychiatric medicines. These  include:  1. CITALOPRAM  2. ARMOUR THYROID  3. LYRICA  4. Lamar  5. TAKE AN EXTRA NEXIUM Monday NIGHT BEFORE BED  6.  ____Fleets enema or Magnesium Citrate as directed.   ____ Use CHG Soap or sage wipes as directed on instruction sheet   ____ Use inhalers on the day of surgery and bring to hospital day of surgery  __X__ Stop Metformin and Janumet 2 days prior to surgery-LAST [place] ON Saturday, July 14TH    __X__ Take 1/2 of usual insulin dose the night before surgery and none on the morning surgery-NO INSULIN THE MORNING OF SURGERY   ____ Follow recommendations from Cardiologist, Pulmonologist or PCP regarding stopping Aspirin, Coumadin, Pllavix ,Eliquis, Effient, or Pradaxa, and Pletal.  X____Stop Anti-inflammatories such as Advil, Aleve, Ibuprofen, Motrin, Naproxen, Naprosyn, Goodies powders or aspirin products. OK to take Tylenol OR HYDROCODONE    _x___ Stop supplements until after surgery-PT ALREADY STOPPED KRILL OIL AND VIT E PRIOR TO COLONOSCOPY AND HAS NOT RESUMED   ____ Bring C-Pap to the hospital.

## 2017-06-12 NOTE — Pre-Procedure Instructions (Signed)
Medical clearance note in Care Everywhere from Dr Doy Hutching

## 2017-06-12 NOTE — Pre-Procedure Instructions (Signed)
Progress Notes - in this encounter  Idelle Crouch, MD - 06/10/2017 4:00 PM EDT Formatting of this note may be different from the original. Stephen Yu is a 61 y.o. male who presents for  [place] Chief Complaint  Patient presents with  . Cough   Subjective: History of Present Illness Pt in NAD. Having surgery next week. Some cough with SOB and wheezing. No fever. Denies CP.  Past Medical History:  Diagnosis Date  . Anxiety, unspecified  . Chronic pain  . Colon polyp  . Depression, unspecified  . Diabetes mellitus type 2, uncomplicated (CMS-HCC)  . GERD (gastroesophageal reflux disease)  . Hyperplastic colon polyp 04/22/2017  . Severe carpal tunnel syndrome of both wrists  . Tubular adenoma of colon, unspecified 04/22/2017   Patient Active Problem List  Diagnosis  . Diabetic sensorimotor neuropathy (CMS-HCC)  . HTN, goal below 140/80  . Pain syndrome, chronic  . Acquired hypothyroidism, unspecified  . Colorectal polyps   Past Surgical History:  Procedure Laterality Date  . COLONOSCOPY 04/22/2017  Tubular adenoma of colon/Hyperplastic colon polyp/Repeat 2 to 3 yrs/MUS   Current Outpatient Prescriptions:  . amitriptyline (ELAVIL) 100 MG tablet, Take 100 mg by mouth nightly as needed for Sleep (Take 1/2 as needed reported by patient)., Disp: , Rfl:  . BASAGLAR KWIKPEN U-100 INSULIN pen injector (concentration 100 units/mL), INJECT 30 UNITS UNDER THE SKIN EVERY MORNING BEFORE BREAKFAST, Disp: 15 mL, Rfl: 3 . blood glucose diagnostic test strip, Use 2 (two) times daily. Use as instructed., Disp: 200 each, Rfl: 11 . citalopram (CELEXA) 40 MG tablet, Take 1 tablet (40 mg total) by mouth once daily., Disp: 90 tablet, Rfl: 3 . esomeprazole (NEXIUM) 40 MG DR capsule, Take 1 capsule (40 mg total) by mouth once daily., Disp: 90 capsule, Rfl: 3 . glyBURIDE-metFORMIN (GLUCOVANCE) 5-500 mg tablet, Take 1 tablet by mouth daily with breakfast., Disp: 90 tablet, Rfl: 3 .  HYDROcodone-acetaminophen (NORCO) 7.5-325 mg tablet, Take 1 tablet by mouth every 6 (six) hours as needed for Pain., Disp: , Rfl:  . lisinopril-hydrochlorothiazide (PRINZIDE,ZESTORETIC) 20-25 mg tablet, Take 1 tablet by mouth once daily., Disp: 90 tablet, Rfl: 3 . niacin 500 MG tablet, Take by mouth., Disp: , Rfl:  . omega-3 acid ethyl esters (LOVAZA) 1 gram capsule, Take by mouth., Disp: , Rfl:  . pregabalin (LYRICA) 150 MG capsule, Take 150 mg by mouth 2 (two) times daily., Disp: , Rfl:  . thyroid, pork, 32.5 mg Tab, Take 32.5 mg by mouth once daily., Disp: 90 tablet, Rfl: 3 . tiZANidine (ZANAFLEX) 4 MG capsule, Take 4 mg by mouth every 8 (eight) hours as needed for Muscle spasms., Disp: , Rfl:  . vitamin E 400 UNIT capsule, Take by mouth., Disp: , Rfl:  . zinc gluconate 50 mg tablet, Take by mouth., Disp: , Rfl:   Biaxin [clarithromycin]; Cephalexin; and Penicillin  Social History   Social History  . Marital status: Widowed  Spouse name: N/A  . Number of children: N/A  . Years of education: N/A   Occupational History  . Not on file.   Social History Main Topics  . Smoking status: Former Research scientist (life sciences)  . Smokeless tobacco: Never Used  . Alcohol use No  . Drug use: No  . Sexual activity: Defer   Other Topics Concern  . Not on file   Social History Narrative  . No narrative on file   Family History  Problem Relation Age of Onset  . High blood pressure (Hypertension) Mother  .  Diabetes type II Maternal Grandmother  . High blood pressure (Hypertension) Maternal Grandmother   A comprehensive ROS was negative except for Respiratory: positive for cough productive of clear sputum, dyspnea on exertion and wheezing  PE: BP 142/78  Wt (!) 106.6 kg (235 lb)  BMI 33.72 kg/m  The patient looks well today. He is in no distress. Neck is supple without adenopathy. Carotids are 2+ bilaterally without bruits. Lungs are clear. Heart is regular rate and rhythm without murmurs, rubs, or  gallops. Abdomen is soft, non tender, no masses or organomegaly. Lower extremities without obvious edema.   Office Visit on 06/10/2017  Component Date Value Ref Range Status  . Vent Rate (bpm) 06/10/2017 70 Preliminary  . PR Interval (msec) 06/10/2017 142 Preliminary  . QRS Interval (msec) 06/10/2017 108 Preliminary  . QT Interval (msec) 06/10/2017 402 Preliminary  . QTc (msec) 06/10/2017 434 Preliminary  Office Visit on 05/09/2017  Component Date Value Ref Range Status  . Glucose 05/09/2017 152* 70 - 110 mg/dL Final  . Sodium 05/09/2017 136 136 - 145 mmol/L Final  . Potassium 05/09/2017 5.0 3.6 - 5.1 mmol/L Final  . Chloride 05/09/2017 98 97 - 109 mmol/L Final  . Carbon Dioxide (CO2) 05/09/2017 31.1 22.0 - 32.0 mmol/L Final  . Urea Nitrogen (BUN) 05/09/2017 31* 7 - 25 mg/dL Final  . Creatinine 05/09/2017 1.2 0.7 - 1.3 mg/dL Final  . Glomerular Filtration Rate (eGFR),* 05/09/2017 61 >60 mL/min/1.73sq m Final  . Calcium 05/09/2017 10.3 8.7 - 10.3 mg/dL Final  . AST 05/09/2017 12 8 - 39 U/L Final  . ALT 05/09/2017 18 6 - 57 U/L Final  . Alk Phos (alkaline Phosphatase) 05/09/2017 53 34 - 104 U/L Final  . Albumin 05/09/2017 4.6 3.5 - 4.8 g/dL Final  . Bilirubin, Total 05/09/2017 0.5 0.3 - 1.2 mg/dL Final  . Protein, Total 05/09/2017 7.6 6.1 - 7.9 g/dL Final  . A/G Ratio 05/09/2017 1.5 1.0 - 5.0 gm/dL Final  . WBC (White Blood Cell Count) 05/09/2017 9.7 4.1 - 10.2 10^3/uL Final  . RBC (Red Blood Cell Count) 05/09/2017 5.20 4.69 - 6.13 10^6/uL Final  . Hemoglobin 05/09/2017 15.2 14.1 - 18.1 gm/dL Final  . Hematocrit 05/09/2017 44.7 40.0 - 52.0 % Final  . MCV (Mean Corpuscular Volume) 05/09/2017 86.0 80.0 - 100.0 fl Final  . MCH (Mean Corpuscular Hemoglobin) 05/09/2017 29.2 27.0 - 31.2 pg Final  . MCHC (Mean Corpuscular Hemoglobin * 05/09/2017 34.0 32.0 - 36.0 gm/dL Final  . Platelet Count 05/09/2017 226 150 - 450 10^3/uL Final  . RDW-CV (Red Cell Distribution Widt* 05/09/2017 13.3 11.6 -  14.8 % Final  . MPV (Mean Platelet Volume) 05/09/2017 11.2 9.4 - 12.4 fl Final  . Neutrophils 05/09/2017 5.71 1.50 - 7.80 10^3/uL Final  . Lymphocytes 05/09/2017 2.67 1.00 - 3.60 10^3/uL Final  . Monocytes 05/09/2017 0.88 0.00 - 1.50 10^3/uL Final  . Eosinophils 05/09/2017 0.30 0.00 - 0.55 10^3/uL Final  . Basophils 05/09/2017 0.05 0.00 - 0.09 10^3/uL Final  . Neutrophil % 05/09/2017 59.2 32.0 - 70.0 % Final  . Lymphocyte % 05/09/2017 27.7 10.0 - 50.0 % Final  . Monocyte % 05/09/2017 9.1 4.0 - 13.0 % Final  . Eosinophil % 05/09/2017 3.1 1.0 - 5.0 % Final  . Basophil% 05/09/2017 0.5 0.0 - 2.0 % Final  . Immature Granulocyte % 05/09/2017 0.4 <=0.7 % Final  . Immature Granulocyte Count 05/09/2017 0.04 <=0.06 10^3/L Final  . Color 05/09/2017 Yellow Yellow, Straw, Blue Final  . Clarity  05/09/2017 Clear Clear, Slightly Cloudy, Turbid Other Final  . Specific Gravity 05/09/2017 1.015 1.000 - 1.030 Final  . pH, Urine 05/09/2017 6.0 5.0 - 8.0 Final  . Protein, Urinalysis 05/09/2017 Negative Negative, Trace mg/dL Final  . Glucose, Urinalysis 05/09/2017 Negative Negative mg/dL Final  . Ketones, Urinalysis 05/09/2017 Negative Negative mg/dL Final  . Blood, Urinalysis 05/09/2017 Negative Negative Final  . Nitrite, Urinalysis 05/09/2017 Negative Negative Final  . Leukocyte Esterase, Urinalysis 05/09/2017 Trace* Negative Final  . White Blood Cells, Urinalysis 05/09/2017 0-3 None Seen, 0-3 /hpf Final  . Red Blood Cells, Urinalysis 05/09/2017 None Seen None Seen, 0-3 /hpf Final  . Bacteria, Urinalysis 05/09/2017 None Seen None Seen /hpf Final  . Squamous Epithelial Cells, Urinaly* 05/09/2017 Rare Rare, Few, None Seen /hpf Final  Office Visit on 04/16/2017  Component Date Value Ref Range Status  . Glucose 04/16/2017 237* 70 - 110 mg/dL Final  . Sodium 04/16/2017 137 136 - 145 mmol/L Final  . Potassium 04/16/2017 4.7 3.6 - 5.1 mmol/L Final  . Chloride 04/16/2017 99 97 - 109 mmol/L Final  . Carbon  Dioxide (CO2) 04/16/2017 28.2 22.0 - 32.0 mmol/L Final  . Urea Nitrogen (BUN) 04/16/2017 22 7 - 25 mg/dL Final  . Creatinine 04/16/2017 1.0 0.7 - 1.3 mg/dL Final  . Glomerular Filtration Rate (eGFR),* 04/16/2017 76 >60 mL/min/1.73sq m Final  . Calcium 04/16/2017 9.8 8.7 - 10.3 mg/dL Final  . AST 04/16/2017 13 8 - 39 U/L Final  . ALT 04/16/2017 23 6 - 57 U/L Final  . Alk Phos (alkaline Phosphatase) 04/16/2017 60 34 - 104 U/L Final  . Albumin 04/16/2017 4.5 3.5 - 4.8 g/dL Final  . Bilirubin, Total 04/16/2017 0.6 0.3 - 1.2 mg/dL Final  . Protein, Total 04/16/2017 7.3 6.1 - 7.9 g/dL Final  . A/G Ratio 04/16/2017 1.6 1.0 - 5.0 gm/dL Final  . WBC (White Blood Cell Count) 04/16/2017 10.4* 4.1 - 10.2 10^3/uL Final  . RBC (Red Blood Cell Count) 04/16/2017 5.32 4.69 - 6.13 10^6/uL Final  . Hemoglobin 04/16/2017 15.4 14.1 - 18.1 gm/dL Final  . Hematocrit 04/16/2017 45.7 40.0 - 52.0 % Final  . MCV (Mean Corpuscular Volume) 04/16/2017 85.9 80.0 - 100.0 fl Final  . MCH (Mean Corpuscular Hemoglobin) 04/16/2017 28.9 27.0 - 31.2 pg Final  . MCHC (Mean Corpuscular Hemoglobin * 04/16/2017 33.7 32.0 - 36.0 gm/dL Final  . Platelet Count 04/16/2017 213 150 - 450 10^3/uL Final  . RDW-CV (Red Cell Distribution Widt* 04/16/2017 13.6 11.6 - 14.8 % Final  . MPV (Mean Platelet Volume) 04/16/2017 11.5 9.4 - 12.4 fl Final  . Neutrophils 04/16/2017 7.45 1.50 - 7.80 10^3/uL Final  . Lymphocytes 04/16/2017 1.86 1.00 - 3.60 10^3/uL Final  . Monocytes 04/16/2017 0.82 0.00 - 1.50 10^3/uL Final  . Eosinophils 04/16/2017 0.21 0.00 - 0.55 10^3/uL Final  . Basophils 04/16/2017 0.05 0.00 - 0.09 10^3/uL Final  . Neutrophil % 04/16/2017 71.3* 32.0 - 70.0 % Final  . Lymphocyte % 04/16/2017 17.8 10.0 - 50.0 % Final  . Monocyte % 04/16/2017 7.9 4.0 - 13.0 % Final  . Eosinophil % 04/16/2017 2.0 1.0 - 5.0 % Final  . Basophil% 04/16/2017 0.5 0.0 - 2.0 % Final  . Immature Granulocyte % 04/16/2017 0.5 <=0.7 % Final  . Immature  Granulocyte Count 04/16/2017 0.05 <=0.06 10^3/L Final  . Cholesterol, Total 04/16/2017 229* 100 - 200 mg/dL Final  . Triglyceride 04/16/2017 246* 35 - 199 mg/dL Final  . HDL (High Density Lipoprotein) Cho* 04/16/2017 31.8 29.0 -  71.0 mg/dL Final  . LDL (Low Density Lipoprotien), Cal* 04/16/2017 148* 0 - 130 mg/dL Final  . VLDL Cholesterol 04/16/2017 49 mg/dL Final  . Cholesterol/HDL Ratio 04/16/2017 7.2 Final  . Hemoglobin A1C 04/16/2017 7.8* 4.2 - 5.6 % Final  . Average Blood Glucose (Calc) 04/16/2017 177 mg/dL Final  . Thyroid Stimulating Hormone (TSH) 04/16/2017 0.822 0.450-5.330 uIU/ml uIU/mL Final  . Thyroxine (T4), Total 04/16/2017 7.4 4.5 - 12.0 ug/dL Final  . Triiodothyronine (T3) Uptake 04/16/2017 40.3 32.0 - 48.4 % TU Final  . T7 (Free Thyroxine Index) 04/16/2017 7 Final  Initial consult on 10/17/2016  Component Date Value Ref Range Status  . Vent Rate (bpm) 10/17/2016 68 Final  . PR Interval (msec) 10/17/2016 146 Final  . QRS Interval (msec) 10/17/2016 102 Final  . QT Interval (msec) 10/17/2016 406 Final  . QTc (msec) 10/17/2016 431 Final  . Glucose 10/17/2016 101 70 - 110 mg/dL Final  . Sodium 10/17/2016 139 136 - 145 mmol/L Final  . Potassium 10/17/2016 4.8 3.6 - 5.1 mmol/L Final  . Chloride 10/17/2016 101 97 - 109 mmol/L Final  . Carbon Dioxide (CO2) 10/17/2016 31.8 22.0 - 32.0 mmol/L Final  . Urea Nitrogen (BUN) 10/17/2016 14 7 - 25 mg/dL Final  . Creatinine 10/17/2016 1.0 0.7 - 1.3 mg/dL Final  . Glomerular Filtration Rate (eGFR),* 10/17/2016 76 >60 mL/min/1.73sq m Final  . Calcium 10/17/2016 9.7 8.7 - 10.3 mg/dL Final  . AST 10/17/2016 51* 8 - 39 U/L Final  . ALT 10/17/2016 49 6 - 57 U/L Final  . Alk Phos (alkaline Phosphatase) 10/17/2016 61 34 - 104 U/L Final  . Albumin 10/17/2016 4.3 3.5 - 4.8 g/dL Final  . Bilirubin, Total 10/17/2016 1.0 0.3 - 1.2 mg/dL Final  . Protein, Total 10/17/2016 7.1 6.1 - 7.9 g/dL Final  . A/G Ratio 10/17/2016 1.5 1.0 - 5.0 gm/dL Final   . WBC (White Blood Cell Count) 10/17/2016 7.0 4.1 - 10.2 10^3/uL Final  . RBC (Red Blood Cell Count) 10/17/2016 4.72 4.69 - 6.13 10^6/uL Final  . Hemoglobin 10/17/2016 14.1 14.1 - 18.1 gm/dL Final  . Hematocrit 10/17/2016 40.6 40.0 - 52.0 % Final  . MCV (Mean Corpuscular Volume) 10/17/2016 86.0 80.0 - 100.0 fl Final  . MCH (Mean Corpuscular Hemoglobin) 10/17/2016 29.9 27.0 - 31.2 pg Final  . MCHC (Mean Corpuscular Hemoglobin * 10/17/2016 34.7 32.0 - 36.0 gm/dL Final  . Platelet Count 10/17/2016 216 150 - 450 10^3/uL Final  . RDW-CV (Red Cell Distribution Widt* 10/17/2016 13.2 11.6 - 14.8 % Final  . MPV (Mean Platelet Volume) 10/17/2016 10.5 9.4 - 12.4 fl Final  . Neutrophils 10/17/2016 3.46 1.50 - 7.80 10^3/uL Final  . Lymphocytes 10/17/2016 2.47 1.00 - 3.60 10^3/uL Final  . Monocytes 10/17/2016 0.75 0.00 - 1.50 10^3/uL Final  . Eosinophils 10/17/2016 0.27 0.00 - 0.55 10^3/uL Final  . Basophils 10/17/2016 0.04 0.00 - 0.09 10^3/uL Final  . Neutrophil % 10/17/2016 49.2 32.0 - 70.0 % Final  . Lymphocyte % 10/17/2016 35.1 10.0 - 50.0 % Final  . Monocyte % 10/17/2016 10.7 4.0 - 13.0 % Final  . Eosinophil % 10/17/2016 3.8 1.0 - 5.0 % Final  . Basophil% 10/17/2016 0.6 0.0 - 2.0 % Final  . Immature Granulocyte % 10/17/2016 0.6 <=0.7 % Final  . Immature Granulocyte Count 10/17/2016 0.04 <=0.06 10^3/L Final  . Cholesterol, Total 10/17/2016 224* 100 - 200 mg/dL Final  . Triglyceride 10/17/2016 117 35 - 199 mg/dL Final  . HDL (High Density Lipoprotein)  Cho* 10/17/2016 35.9 29.0 - 71.0 mg/dL Final  . LDL (Low Density Lipoprotien), Cal* 10/17/2016 165* 0 - 130 mg/dL Final  . VLDL Cholesterol 10/17/2016 23 mg/dL Final  . Cholesterol/HDL Ratio 10/17/2016 6.2 Final  . PSA (Prostate Specific Antigen), T* 10/17/2016 0.73 0.10 - 4.00 ng/mL Final  . Hemoglobin A1C 10/17/2016 8.1* 4.2 - 5.6 % Final  . Average Blood Glucose (Calc) 10/17/2016 186 mg/dL Final  . Color 10/17/2016 Yellow Yellow, Straw, Blue  Final  . Clarity 10/17/2016 Clear Clear, Slightly Cloudy, Turbid Other Final  . Specific Gravity 10/17/2016 <=1.005 1.000 - 1.030 Final  . pH, Urine 10/17/2016 6.0 5.0 - 8.0 Final  . Protein, Urinalysis 10/17/2016 Negative Negative, Trace mg/dL Final  . Glucose, Urinalysis 10/17/2016 Negative Negative mg/dL Final  . Ketones, Urinalysis 10/17/2016 Negative Negative mg/dL Final  . Blood, Urinalysis 10/17/2016 Negative Negative Final  . Nitrite, Urinalysis 10/17/2016 Negative Negative Final  . Leukocyte Esterase, Urinalysis 10/17/2016 Negative Negative Final  . White Blood Cells, Urinalysis 10/17/2016 None Seen None Seen, 0-3 /hpf Final  . Red Blood Cells, Urinalysis 10/17/2016 None Seen None Seen, 0-3 /hpf Final  . Bacteria, Urinalysis 10/17/2016 None Seen None Seen /hpf Final  . Squamous Epithelial Cells, Urinaly* 10/17/2016 None Seen Rare, Few, None Seen /hpf Final  . Thyroid Stimulating Hormone (TSH) 10/17/2016 0.780 0.450-5.330 uIU/ml uIU/mL Final  . Thyroxine (T4), Total 10/17/2016 8.3 4.5 - 12.0 ug/dL Final  . Triiodothyronine (T3) Uptake 10/17/2016 43.8 32.0 - 48.4 % TU Final  . T7 (Free Thyroxine Index) 10/17/2016 9 Final   DIAGNOSIS: Pre-operative clearance (primary encounter diagnosis) Plan: ECG 12-lead  HTN, goal below 140/80  Persistent cough  SOB (shortness of breath)  PLAN: EKG today OK. Labs today. CXR today. Empiric course of Levquin. Cheratussin as needed. Proceed with surgery.  I personally performed the service. (TP)  Idelle Crouch, MD, MD     Plan of Treatment - as of this encounter   Upcoming Encounters Upcoming Encounters  Date Type Specialty Care Team Description  10/20/2017 Office Visit Internal Medicine Idelle Crouch, MD  [place]  [facility] [place], [place] [address]  [phone]  [phone] (Fax)     Pending Results Pending Results  Name Priority Associated Diagnoses Date/Time  ECG 12-lead Routine  Pre-operative clearance  06/10/2017 4:00 PM EDT   Lab Results - in this encounter   Table of Contents for Lab Results CBC w/auto Differential (5 Part) (06/10/2017 4:33 PM) Comprehensive Metabolic Panel (CMP) (86/76/7209 4:33 PM)    CBC w/auto Differential (5 Part) (06/10/2017 4:33 PM) CBC w/auto Differential (5 Part) (06/10/2017 4:33 PM)  Component Value Ref Range  WBC (White Blood Cell Count) 9.0 4.1 - 10.2 10^3/uL  RBC (Red Blood Cell Count) 4.74 4.69 - 6.13 10^6/uL  Hemoglobin 14.4 14.1 - 18.1 gm/dL  Hematocrit 41.6 40.0 - 52.0 %  MCV (Mean Corpuscular Volume) 87.8 80.0 - 100.0 fl  MCH (Mean Corpuscular Hemoglobin) 30.4 27.0 - 31.2 pg  MCHC (Mean Corpuscular Hemoglobin Concentration) 34.6 32.0 - 36.0 gm/dL  Platelet Count 270 150 - 450 10^3/uL  RDW-CV (Red Cell Distribution Width) 13.2 11.6 - 14.8 %  MPV (Mean Platelet Volume) 11.1 9.4 - 12.4 fl  Neutrophils 5.58 1.50 - 7.80 10^3/uL  Lymphocytes 2.21 1.00 - 3.60 10^3/uL  Monocytes 0.76 0.00 - 1.50 10^3/uL  Eosinophils 0.29 0.00 - 0.55 10^3/uL  Basophils 0.06 0.00 - 0.09 10^3/uL  Neutrophil % 62.0 32.0 - 70.0 %  Lymphocyte % 24.6 10.0 -  50.0 %  Monocyte % 8.4 4.0 - 13.0 %  Eosinophil % 3.2 1.0 - 5.0 %  Basophil% 0.7 0.0 - 2.0 %  Immature Granulocyte % 1.1 (H) <=0.7 %  Immature Granulocyte Count 0.10 (H) <=0.06 10^3/L   CBC w/auto Differential (5 Part) (06/10/2017 4:33 PM)  Specimen Performing Laboratory  Blood [place]  [place], [place] [address]   Back to top of Lab Results   Comprehensive Metabolic Panel (CMP) (99/35/7017 4:33 PM) Comprehensive Metabolic Panel (CMP) (79/39/0300 4:33 PM)  Component Value Ref Range  Glucose 248 (H) 70 - 110 mg/dL  Sodium 136 136 - 145 mmol/L  Potassium 5.2 (H) 3.6 - 5.1 mmol/L  Chloride 96 (L) 97 - 109 mmol/L  Carbon Dioxide (CO2) 33.1 (H) 22.0 - 32.0 mmol/L  Urea Nitrogen (BUN) 19 7 - 25 mg/dL  Creatinine 1.2 0.7 - 1.3 mg/dL    Glomerular Filtration Rate (eGFR), MDRD Estimate 61 >60 mL/min/1.73sq m  Calcium 10.2 8.7 - 10.3 mg/dL  AST  14 8 - 39 U/L  ALT  21 6 - 57 U/L  Alk Phos (alkaline Phosphatase) 50 34 - 104 U/L  Albumin 4.4 3.5 - 4.8 g/dL  Bilirubin, Total 0.6 0.3 - 1.2 mg/dL  Protein, Total 7.4 6.1 - 7.9 g/dL  A/G Ratio 1.5 1.0 - 5.0 gm/dL   Comprehensive Metabolic Panel (CMP) (92/33/0076 4:33 PM)  Specimen Performing Laboratory  Blood [facility]  [place], [place] [address]   Back to top of Lab Results   Imaging Results - in this encounter    X-ray chest PA and lateral (06/10/2017 4:32 PM)   Visit Diagnoses    Diagnosis  Pre-operative clearance - Primary  Unspecified pre-operative examination   HTN, goal below 140/80  Persistent cough  SOB (shortness of breath)  Shortness of breath   High risk medication use   Images Document Information   Primary Care Provider Idelle Crouch MD (Oct. 16, 2017 - Present) [phone] (Work) [phone] (Fax) [place], [place] [address]  Document Coverage Dates Jul. 10, 2018  [place], [place] [address]   Encounter Providers Idelle Crouch MD (Attending) [phone] (Work) [phone] (Fax) [place] [facility] [place], [place] [address]   Encounter Date Jul. 10, 2018

## 2017-06-16 MED ORDER — ERTAPENEM SODIUM 1 G IJ SOLR
1.0000 g | INTRAMUSCULAR | Status: AC
  Administered 2017-06-17: 1 g via INTRAVENOUS
  Filled 2017-06-16: qty 1

## 2017-06-17 ENCOUNTER — Encounter
Admission: RE | Disposition: A | Discharge status: Home / Self Care | Payer: Self-pay | Source: Ambulatory Visit | Attending: General Surgery

## 2017-06-17 ENCOUNTER — Inpatient Hospital Stay: Payer: Federal, State, Local not specified - PPO | Admitting: Anesthesiology

## 2017-06-17 ENCOUNTER — Inpatient Hospital Stay
Admission: RE | Admit: 2017-06-17 | Discharge: 2017-06-20 | DRG: 331 | Disposition: A | Discharge status: Home / Self Care | Payer: Federal, State, Local not specified - PPO | Source: Ambulatory Visit | Attending: General Surgery | Admitting: General Surgery

## 2017-06-17 ENCOUNTER — Encounter: Payer: Self-pay | Admitting: *Deleted

## 2017-06-17 DIAGNOSIS — Z881 Allergy status to other antibiotic agents status: Secondary | ICD-10-CM | POA: Diagnosis not present

## 2017-06-17 DIAGNOSIS — I10 Essential (primary) hypertension: Secondary | ICD-10-CM | POA: Diagnosis present

## 2017-06-17 DIAGNOSIS — Z87891 Personal history of nicotine dependence: Secondary | ICD-10-CM | POA: Diagnosis not present

## 2017-06-17 DIAGNOSIS — K635 Polyp of colon: Secondary | ICD-10-CM | POA: Diagnosis present

## 2017-06-17 DIAGNOSIS — G8929 Other chronic pain: Secondary | ICD-10-CM | POA: Diagnosis present

## 2017-06-17 DIAGNOSIS — E039 Hypothyroidism, unspecified: Secondary | ICD-10-CM | POA: Diagnosis present

## 2017-06-17 DIAGNOSIS — Z794 Long term (current) use of insulin: Secondary | ICD-10-CM

## 2017-06-17 DIAGNOSIS — K219 Gastro-esophageal reflux disease without esophagitis: Secondary | ICD-10-CM | POA: Diagnosis present

## 2017-06-17 DIAGNOSIS — Z880 Allergy status to penicillin: Secondary | ICD-10-CM | POA: Diagnosis not present

## 2017-06-17 DIAGNOSIS — E119 Type 2 diabetes mellitus without complications: Secondary | ICD-10-CM | POA: Diagnosis present

## 2017-06-17 DIAGNOSIS — M1990 Unspecified osteoarthritis, unspecified site: Secondary | ICD-10-CM | POA: Diagnosis present

## 2017-06-17 DIAGNOSIS — D123 Benign neoplasm of transverse colon: Secondary | ICD-10-CM | POA: Diagnosis present

## 2017-06-17 DIAGNOSIS — Z803 Family history of malignant neoplasm of breast: Secondary | ICD-10-CM | POA: Diagnosis not present

## 2017-06-17 DIAGNOSIS — K429 Umbilical hernia without obstruction or gangrene: Secondary | ICD-10-CM | POA: Diagnosis present

## 2017-06-17 HISTORY — PX: UMBILICAL HERNIA REPAIR: SHX196

## 2017-06-17 HISTORY — PX: PARTIAL COLECTOMY: SHX5273

## 2017-06-17 LAB — POCT I-STAT 4, (NA,K, GLUC, HGB,HCT)
Glucose, Bld: 156 mg/dL — ABNORMAL HIGH (ref 65–99)
HCT: 43 % (ref 39.0–52.0)
Hemoglobin: 14.6 g/dL (ref 13.0–17.0)
Potassium: 4.1 mmol/L (ref 3.5–5.1)
Sodium: 136 mmol/L (ref 135–145)

## 2017-06-17 LAB — GLUCOSE, CAPILLARY
Glucose-Capillary: 149 mg/dL — ABNORMAL HIGH (ref 65–99)
Glucose-Capillary: 227 mg/dL — ABNORMAL HIGH (ref 65–99)
Glucose-Capillary: 265 mg/dL — ABNORMAL HIGH (ref 65–99)
Glucose-Capillary: 201 mg/dL — ABNORMAL HIGH (ref 65–99)

## 2017-06-17 LAB — CBC
HCT: 40.6 % (ref 40.0–52.0)
Hemoglobin: 13.9 g/dL (ref 13.0–18.0)
MCH: 29.2 pg (ref 26.0–34.0)
MCHC: 34.1 g/dL (ref 32.0–36.0)
MCV: 85.6 fL (ref 80.0–100.0)
Platelets: 233 10*3/uL (ref 150–440)
RBC: 4.75 MIL/uL (ref 4.40–5.90)
RDW: 14.4 % (ref 11.5–14.5)
WBC: 17.1 10*3/uL — ABNORMAL HIGH (ref 3.8–10.6)

## 2017-06-17 LAB — SURGICAL PCR SCREEN
MRSA, PCR: NEGATIVE
Staphylococcus aureus: NEGATIVE

## 2017-06-17 LAB — CREATININE, SERUM
Creatinine, Ser: 1.26 mg/dL — ABNORMAL HIGH (ref 0.61–1.24)
GFR calc Af Amer: >60 mL/min (ref >60)
GFR calc non Af Amer: 59 mL/min — ABNORMAL LOW (ref >60)

## 2017-06-17 SURGERY — REPAIR, HERNIA, UMBILICAL, ADULT
Anesthesia: General | Laterality: Right | Wound class: Clean

## 2017-06-17 MED ORDER — SODIUM CHLORIDE 0.45 % IV SOLN
INTRAVENOUS | Status: DC
  Administered 2017-06-17: 1000 mL via INTRAVENOUS
  Administered 2017-06-17 – 2017-06-20 (×6): via INTRAVENOUS

## 2017-06-17 MED ORDER — SODIUM CHLORIDE 0.9 % IJ SOLN
INTRAMUSCULAR | Status: AC
  Filled 2017-06-17: qty 10

## 2017-06-17 MED ORDER — ACETAMINOPHEN 10 MG/ML IV SOLN
INTRAVENOUS | Status: DC | PRN
  Administered 2017-06-17: 1000 mg via INTRAVENOUS

## 2017-06-17 MED ORDER — KETOROLAC TROMETHAMINE 30 MG/ML IJ SOLN
INTRAMUSCULAR | Status: AC
  Filled 2017-06-17: qty 1

## 2017-06-17 MED ORDER — VITAMIN D 1000 UNITS PO TABS
5000.0000 [IU] | ORAL_TABLET | Freq: Every day | ORAL | Status: DC
  Administered 2017-06-18 – 2017-06-20 (×3): 5000 [IU] via ORAL
  Filled 2017-06-17 (×3): qty 5

## 2017-06-17 MED ORDER — ENOXAPARIN SODIUM 40 MG/0.4ML ~~LOC~~ SOLN
40.0000 mg | SUBCUTANEOUS | Status: DC
  Administered 2017-06-18 – 2017-06-19 (×2): 40 mg via SUBCUTANEOUS
  Filled 2017-06-17 (×2): qty 0.4

## 2017-06-17 MED ORDER — HYDROMORPHONE HCL 1 MG/ML IJ SOLN
INTRAMUSCULAR | Status: AC
  Administered 2017-06-17: 0.5 mg via INTRAVENOUS
  Filled 2017-06-17: qty 1

## 2017-06-17 MED ORDER — FENTANYL CITRATE (PF) 100 MCG/2ML IJ SOLN
INTRAMUSCULAR | Status: DC | PRN
  Administered 2017-06-17: 50 ug via INTRAVENOUS
  Administered 2017-06-17: 150 ug via INTRAVENOUS
  Administered 2017-06-17 (×4): 50 ug via INTRAVENOUS

## 2017-06-17 MED ORDER — THYROID 30 MG PO TABS: 32.5000 mg | ORAL_TABLET | ORAL | Status: DC

## 2017-06-17 MED ORDER — INSULIN ASPART 100 UNIT/ML ~~LOC~~ SOLN
0.0000 [IU] | Freq: Three times a day (TID) | SUBCUTANEOUS | Status: DC
  Administered 2017-06-17: 8 [IU] via SUBCUTANEOUS
  Administered 2017-06-18: 5 [IU] via SUBCUTANEOUS
  Administered 2017-06-18 – 2017-06-19 (×3): 3 [IU] via SUBCUTANEOUS
  Administered 2017-06-19: 2 [IU] via SUBCUTANEOUS
  Administered 2017-06-19: 3 [IU] via SUBCUTANEOUS
  Administered 2017-06-20 (×2): 5 [IU] via SUBCUTANEOUS
  Filled 2017-06-17 (×9): qty 1

## 2017-06-17 MED ORDER — HYDROMORPHONE HCL 1 MG/ML IJ SOLN
0.2500 mg | INTRAMUSCULAR | Status: DC | PRN
Start: 2017-06-17 — End: 2017-06-17
  Administered 2017-06-17 (×4): 0.5 mg via INTRAVENOUS

## 2017-06-17 MED ORDER — ROCURONIUM BROMIDE 50 MG/5ML IV SOLN
INTRAVENOUS | Status: AC
  Filled 2017-06-17: qty 1

## 2017-06-17 MED ORDER — ONDANSETRON HCL 4 MG/2ML IJ SOLN
4.0000 mg | Freq: Four times a day (QID) | INTRAMUSCULAR | Status: DC | PRN
  Administered 2017-06-18: 4 mg via INTRAVENOUS
  Filled 2017-06-17: qty 2

## 2017-06-17 MED ORDER — HYDROCHLOROTHIAZIDE 25 MG PO TABS
25.0000 mg | ORAL_TABLET | Freq: Every day | ORAL | Status: DC
  Administered 2017-06-17 – 2017-06-18 (×2): 25 mg via ORAL
  Filled 2017-06-17 (×4): qty 1

## 2017-06-17 MED ORDER — OXYMETAZOLINE HCL 0.05 % NA SOLN
1.0000 | Freq: Two times a day (BID) | NASAL | Status: DC | PRN
  Filled 2017-06-17: qty 15

## 2017-06-17 MED ORDER — KETOROLAC TROMETHAMINE 30 MG/ML IJ SOLN
INTRAMUSCULAR | Status: DC | PRN
  Administered 2017-06-17: 30 mg via INTRAVENOUS

## 2017-06-17 MED ORDER — PHENYLEPHRINE HCL 10 MG/ML IJ SOLN
INTRAMUSCULAR | Status: AC
  Filled 2017-06-17: qty 1

## 2017-06-17 MED ORDER — CITALOPRAM HYDROBROMIDE 20 MG PO TABS
40.0000 mg | ORAL_TABLET | ORAL | Status: DC
  Administered 2017-06-18 – 2017-06-20 (×3): 40 mg via ORAL
  Filled 2017-06-17 (×3): qty 2

## 2017-06-17 MED ORDER — SEVOFLURANE IN SOLN
RESPIRATORY_TRACT | Status: AC
  Filled 2017-06-17: qty 250

## 2017-06-17 MED ORDER — SUCCINYLCHOLINE CHLORIDE 20 MG/ML IJ SOLN
INTRAMUSCULAR | Status: AC
  Filled 2017-06-17: qty 1

## 2017-06-17 MED ORDER — MORPHINE SULFATE (PF) 2 MG/ML IV SOLN
2.0000 mg | INTRAVENOUS | Status: DC | PRN
  Administered 2017-06-17 – 2017-06-18 (×4): 2 mg via INTRAVENOUS
  Filled 2017-06-17 (×5): qty 1

## 2017-06-17 MED ORDER — PREGABALIN 75 MG PO CAPS
150.0000 mg | ORAL_CAPSULE | Freq: Two times a day (BID) | ORAL | Status: DC
  Administered 2017-06-17 – 2017-06-20 (×6): 150 mg via ORAL
  Filled 2017-06-17 (×6): qty 2

## 2017-06-17 MED ORDER — ACETAMINOPHEN 325 MG PO TABS: 650.0000 mg | ORAL_TABLET | Freq: Four times a day (QID) | ORAL | Status: DC | PRN

## 2017-06-17 MED ORDER — LABETALOL HCL 5 MG/ML IV SOLN
INTRAVENOUS | Status: DC | PRN
  Administered 2017-06-17 (×2): 5 mg via INTRAVENOUS
  Administered 2017-06-17: 10 mg via INTRAVENOUS

## 2017-06-17 MED ORDER — PROPOFOL 10 MG/ML IV BOLUS
INTRAVENOUS | Status: DC | PRN
  Administered 2017-06-17: 180 mg via INTRAVENOUS

## 2017-06-17 MED ORDER — ONDANSETRON HCL 4 MG/2ML IJ SOLN
INTRAMUSCULAR | Status: DC | PRN
  Administered 2017-06-17: 4 mg via INTRAVENOUS

## 2017-06-17 MED ORDER — SUCCINYLCHOLINE CHLORIDE 20 MG/ML IJ SOLN
INTRAMUSCULAR | Status: DC | PRN
  Administered 2017-06-17: 100 mg via INTRAVENOUS

## 2017-06-17 MED ORDER — FENTANYL CITRATE (PF) 100 MCG/2ML IJ SOLN
INTRAMUSCULAR | Status: AC
  Filled 2017-06-17: qty 2

## 2017-06-17 MED ORDER — PREGABALIN 75 MG PO CAPS
150.0000 mg | ORAL_CAPSULE | Freq: Two times a day (BID) | ORAL | Status: DC
Start: 2017-06-17 — End: 2017-06-17

## 2017-06-17 MED ORDER — HYDROMORPHONE HCL 1 MG/ML IJ SOLN
INTRAMUSCULAR | Status: AC
  Filled 2017-06-17: qty 1

## 2017-06-17 MED ORDER — CHLORHEXIDINE GLUCONATE CLOTH 2 % EX PADS: 6.0000 | MEDICATED_PAD | Freq: Once | CUTANEOUS | Status: DC

## 2017-06-17 MED ORDER — LISINOPRIL-HYDROCHLOROTHIAZIDE 20-25 MG PO TABS: 0.5000 | ORAL_TABLET | Freq: Every day | ORAL | Status: DC

## 2017-06-17 MED ORDER — ONDANSETRON HCL 4 MG/2ML IJ SOLN
INTRAMUSCULAR | Status: AC
  Filled 2017-06-17: qty 2

## 2017-06-17 MED ORDER — MIDAZOLAM HCL 2 MG/2ML IJ SOLN
INTRAMUSCULAR | Status: DC | PRN
Start: 2017-06-17 — End: 2017-06-17
  Administered 2017-06-17: 2 mg via INTRAVENOUS

## 2017-06-17 MED ORDER — SUGAMMADEX SODIUM 200 MG/2ML IV SOLN
INTRAVENOUS | Status: DC | PRN
  Administered 2017-06-17: 200 mg via INTRAVENOUS

## 2017-06-17 MED ORDER — TIZANIDINE HCL 4 MG PO TABS
4.0000 mg | ORAL_TABLET | Freq: Three times a day (TID) | ORAL | Status: DC | PRN
  Administered 2017-06-19: 4 mg via ORAL
  Filled 2017-06-17 (×2): qty 1

## 2017-06-17 MED ORDER — FENTANYL CITRATE (PF) 100 MCG/2ML IJ SOLN
INTRAMUSCULAR | Status: AC
  Administered 2017-06-17: 25 ug via INTRAVENOUS
  Filled 2017-06-17: qty 2

## 2017-06-17 MED ORDER — LIDOCAINE HCL (PF) 2 % IJ SOLN
INTRAMUSCULAR | Status: AC
  Filled 2017-06-17: qty 2

## 2017-06-17 MED ORDER — ZINC SULFATE 220 (50 ZN) MG PO CAPS
220.0000 mg | ORAL_CAPSULE | Freq: Every day | ORAL | Status: DC
  Administered 2017-06-18 – 2017-06-20 (×3): 220 mg via ORAL
  Filled 2017-06-17 (×3): qty 1

## 2017-06-17 MED ORDER — ONDANSETRON HCL 4 MG/2ML IJ SOLN: 4.0000 mg | Freq: Once | INTRAMUSCULAR | Status: DC | PRN

## 2017-06-17 MED ORDER — SUGAMMADEX SODIUM 200 MG/2ML IV SOLN
INTRAVENOUS | Status: AC
  Filled 2017-06-17: qty 2

## 2017-06-17 MED ORDER — ROCURONIUM BROMIDE 100 MG/10ML IV SOLN
INTRAVENOUS | Status: DC | PRN
Start: 2017-06-17 — End: 2017-06-17
  Administered 2017-06-17: 20 mg via INTRAVENOUS
  Administered 2017-06-17 (×2): 10 mg via INTRAVENOUS
  Administered 2017-06-17: 20 mg via INTRAVENOUS
  Administered 2017-06-17: 5 mg via INTRAVENOUS
  Administered 2017-06-17: 45 mg via INTRAVENOUS
  Administered 2017-06-17: 10 mg via INTRAVENOUS

## 2017-06-17 MED ORDER — VITAMIN D3 125 MCG (5000 UT) PO TABS
5000.0000 [IU] | ORAL_TABLET | Freq: Every day | ORAL | Status: DC
Start: 2017-06-17 — End: 2017-06-17

## 2017-06-17 MED ORDER — FENTANYL CITRATE (PF) 250 MCG/5ML IJ SOLN
INTRAMUSCULAR | Status: AC
  Filled 2017-06-17: qty 5

## 2017-06-17 MED ORDER — MIDAZOLAM HCL 2 MG/2ML IJ SOLN
INTRAMUSCULAR | Status: AC
  Filled 2017-06-17: qty 2

## 2017-06-17 MED ORDER — ESMOLOL HCL 100 MG/10ML IV SOLN
INTRAVENOUS | Status: DC | PRN
  Administered 2017-06-17: 50 mg via INTRAVENOUS

## 2017-06-17 MED ORDER — EPHEDRINE SULFATE 50 MG/ML IJ SOLN
INTRAMUSCULAR | Status: DC | PRN
  Administered 2017-06-17: 10 mg via INTRAVENOUS

## 2017-06-17 MED ORDER — ONDANSETRON 4 MG PO TBDP: 4.0000 mg | ORAL_TABLET | Freq: Four times a day (QID) | ORAL | Status: DC | PRN

## 2017-06-17 MED ORDER — ACETAMINOPHEN 650 MG RE SUPP: 650.0000 mg | Freq: Four times a day (QID) | RECTAL | Status: DC | PRN

## 2017-06-17 MED ORDER — ZINC GLUCONATE 50 MG PO TABS: 50.0000 mg | ORAL_TABLET | Freq: Every day | ORAL | Status: DC

## 2017-06-17 MED ORDER — INSULIN ASPART 100 UNIT/ML ~~LOC~~ SOLN
0.0000 [IU] | Freq: Every day | SUBCUTANEOUS | Status: DC
  Administered 2017-06-17: 2 [IU] via SUBCUTANEOUS
  Filled 2017-06-17: qty 1

## 2017-06-17 MED ORDER — PANTOPRAZOLE SODIUM 40 MG IV SOLR
40.0000 mg | Freq: Every day | INTRAVENOUS | Status: DC
  Administered 2017-06-17 – 2017-06-19 (×3): 40 mg via INTRAVENOUS
  Filled 2017-06-17 (×3): qty 40

## 2017-06-17 MED ORDER — AMITRIPTYLINE HCL 50 MG PO TABS
50.0000 mg | ORAL_TABLET | Freq: Every evening | ORAL | Status: DC | PRN
Start: 2017-06-17 — End: 2017-06-20
  Administered 2017-06-19: 50 mg via ORAL
  Filled 2017-06-17 (×2): qty 1

## 2017-06-17 MED ORDER — LIDOCAINE HCL (CARDIAC) 20 MG/ML IV SOLN
INTRAVENOUS | Status: DC | PRN
  Administered 2017-06-17: 100 mg via INTRAVENOUS

## 2017-06-17 MED ORDER — SALINE SPRAY 0.65 % NA SOLN
1.0000 | Freq: Four times a day (QID) | NASAL | Status: DC | PRN
  Filled 2017-06-17: qty 44

## 2017-06-17 MED ORDER — LISINOPRIL 20 MG PO TABS
20.0000 mg | ORAL_TABLET | Freq: Every day | ORAL | Status: DC
  Administered 2017-06-18: 20 mg via ORAL
  Filled 2017-06-17 (×4): qty 1

## 2017-06-17 MED ORDER — ACETAMINOPHEN 10 MG/ML IV SOLN
INTRAVENOUS | Status: AC
  Filled 2017-06-17: qty 100

## 2017-06-17 MED ORDER — PROMETHAZINE HCL 25 MG/ML IJ SOLN
6.2500 mg | Freq: Once | INTRAMUSCULAR | Status: AC
  Administered 2017-06-17: 6.25 mg via INTRAVENOUS

## 2017-06-17 MED ORDER — OXYCODONE HCL 5 MG PO TABS
5.0000 mg | ORAL_TABLET | ORAL | Status: DC | PRN
  Administered 2017-06-17: 5 mg via ORAL
  Administered 2017-06-17 – 2017-06-19 (×6): 10 mg via ORAL
  Administered 2017-06-19: 5 mg via ORAL
  Administered 2017-06-20: 10 mg via ORAL
  Administered 2017-06-20: 5 mg via ORAL
  Filled 2017-06-17: qty 1
  Filled 2017-06-17 (×3): qty 2
  Filled 2017-06-17: qty 1
  Filled 2017-06-17 (×4): qty 2
  Filled 2017-06-17: qty 1

## 2017-06-17 MED ORDER — FENTANYL CITRATE (PF) 100 MCG/2ML IJ SOLN
25.0000 ug | INTRAMUSCULAR | Status: AC | PRN
  Administered 2017-06-17 (×6): 25 ug via INTRAVENOUS

## 2017-06-17 MED ORDER — PROMETHAZINE HCL 25 MG/ML IJ SOLN
INTRAMUSCULAR | Status: AC
  Administered 2017-06-17: 6.25 mg via INTRAVENOUS
  Filled 2017-06-17: qty 1

## 2017-06-17 MED ORDER — PROPOFOL 10 MG/ML IV BOLUS
INTRAVENOUS | Status: AC
  Filled 2017-06-17: qty 20

## 2017-06-17 MED ORDER — PHENYLEPHRINE HCL 10 MG/ML IJ SOLN
INTRAMUSCULAR | Status: DC | PRN
  Administered 2017-06-17: 100 ug via INTRAVENOUS
  Administered 2017-06-17: 200 ug via INTRAVENOUS
  Administered 2017-06-17: 100 ug via INTRAVENOUS
  Administered 2017-06-17: 200 ug via INTRAVENOUS
  Administered 2017-06-17: 100 ug via INTRAVENOUS

## 2017-06-17 MED ORDER — SODIUM CHLORIDE 0.9 % IV SOLN
INTRAVENOUS | Status: DC
  Administered 2017-06-17 (×2): via INTRAVENOUS

## 2017-06-17 MED ORDER — THYROID 60 MG PO TABS
30.0000 mg | ORAL_TABLET | Freq: Every day | ORAL | Status: DC
  Administered 2017-06-18 – 2017-06-20 (×3): 30 mg via ORAL
  Filled 2017-06-17 (×3): qty 1

## 2017-06-17 SURGICAL SUPPLY — 78 items
APPLIER CLIP ROT 10 11.4 M/L (STAPLE)
BLADE SURG 10 STRL SS SAFETY (BLADE) ×5 IMPLANT
BLADE SURG 11 STRL SS SAFETY (MISCELLANEOUS) ×5 IMPLANT
CANISTER SUCT 1200ML W/VALVE (MISCELLANEOUS) ×5 IMPLANT
CANNULA DILATOR 10 W/SLV (CANNULA) IMPLANT
CANNULA DILATOR 10MM W/SLV (CANNULA)
CATH FOL LEG HOLDER (MISCELLANEOUS) ×5 IMPLANT
CATH TRAY 16F METER LATEX (MISCELLANEOUS) ×5 IMPLANT
CHLORAPREP W/TINT 26ML (MISCELLANEOUS) ×10 IMPLANT
CLEANER CAUTERY TIP 5X5 PAD (MISCELLANEOUS) ×3 IMPLANT
CLIP APPLIE ROT 10 11.4 M/L (STAPLE) IMPLANT
CLOSURE WOUND 1/2 X4 (GAUZE/BANDAGES/DRESSINGS) ×1
DEFOGGER SCOPE WARMER CLEARIFY (MISCELLANEOUS) ×5 IMPLANT
DEVICE HAND ACCESS DEXTUS (MISCELLANEOUS) IMPLANT
DRAPE INCISE IOBAN 66X45 STRL (DRAPES) ×5 IMPLANT
DRSG OPSITE POSTOP 4X10 (GAUZE/BANDAGES/DRESSINGS) ×5 IMPLANT
DRSG OPSITE POSTOP 4X8 (GAUZE/BANDAGES/DRESSINGS) IMPLANT
DRSG TEGADERM 2-3/8X2-3/4 SM (GAUZE/BANDAGES/DRESSINGS) ×10 IMPLANT
DRSG TELFA 3X8 NADH (GAUZE/BANDAGES/DRESSINGS) ×5 IMPLANT
ELECT BLADE 6.5 EXT (BLADE) ×5 IMPLANT
ELECT REM PT RETURN 9FT ADLT (ELECTROSURGICAL) ×5
ELECTRODE REM PT RTRN 9FT ADLT (ELECTROSURGICAL) ×3 IMPLANT
FILTER LAP SMOKE EVAC STRL (MISCELLANEOUS) ×5 IMPLANT
GLOVE BIO SURGEON STRL SZ7 (GLOVE) ×50 IMPLANT
GOWN STRL REUS W/ TWL LRG LVL3 (GOWN DISPOSABLE) ×21 IMPLANT
GOWN STRL REUS W/TWL LRG LVL3 (GOWN DISPOSABLE) ×14
HANDLE YANKAUER SUCT BULB TIP (MISCELLANEOUS) ×10 IMPLANT
IRRIGATION STRYKERFLOW (MISCELLANEOUS) IMPLANT
IRRIGATOR STRYKERFLOW (MISCELLANEOUS)
IV LACTATED RINGERS 1000ML (IV SOLUTION) IMPLANT
KIT PINK PAD W/HEAD ARE REST (MISCELLANEOUS) ×5
KIT PINK PAD W/HEAD ARM REST (MISCELLANEOUS) ×3 IMPLANT
KIT RM TURNOVER STRD PROC AR (KITS) ×5 IMPLANT
LABEL OR SOLS (LABEL) ×5 IMPLANT
NDL INSUFF ACCESS 14 VERSASTEP (NEEDLE) IMPLANT
NS IRRIG 1000ML POUR BTL (IV SOLUTION) ×15 IMPLANT
NS IRRIG 500ML POUR BTL (IV SOLUTION) ×5 IMPLANT
PACK COLON CLEAN CLOSURE (MISCELLANEOUS) ×5 IMPLANT
PACK LAP CHOLECYSTECTOMY (MISCELLANEOUS) ×5 IMPLANT
PAD CLEANER CAUTERY TIP 5X5 (MISCELLANEOUS) ×2
PENCIL ELECTRO HAND CTR (MISCELLANEOUS) ×5 IMPLANT
PROT DEXTUS HAND ACCESS (MISCELLANEOUS)
RELOAD PROXIMATE 75MM BLUE (ENDOMECHANICALS) ×5 IMPLANT
RETRACTOR FIXED LENGTH SML (MISCELLANEOUS) IMPLANT
RETRACTOR WND ALEXIS-O 25 LRG (MISCELLANEOUS) ×3 IMPLANT
RETRACTOR WOUND ALXS 18CM MED (MISCELLANEOUS) IMPLANT
RETRACTOR WOUND ALXS 18CM SML (MISCELLANEOUS) IMPLANT
RTRCTR WOUND ALEXIS O 18CM MED (MISCELLANEOUS)
RTRCTR WOUND ALEXIS O 18CM SML (MISCELLANEOUS)
RTRCTR WOUND ALEXIS O 25CM LRG (MISCELLANEOUS) ×5
SCISSORS METZENBAUM CVD 33 (INSTRUMENTS) IMPLANT
SET YANKAUER POOLE SUCT (MISCELLANEOUS) ×5 IMPLANT
SHEARS HARMONIC ACE PLUS 36CM (ENDOMECHANICALS) ×5 IMPLANT
SLEEVE ENDOPATH XCEL 5M (ENDOMECHANICALS) IMPLANT
SPONGE LAP 18X18 5 PK (GAUZE/BANDAGES/DRESSINGS) ×20 IMPLANT
STAPLER PROXIMATE 75MM BLUE (STAPLE) ×5 IMPLANT
STAPLER SKIN PROX 35W (STAPLE) ×5 IMPLANT
STRIP CLOSURE SKIN 1/2X4 (GAUZE/BANDAGES/DRESSINGS) ×4 IMPLANT
SURGILUBE 2OZ TUBE FLIPTOP (MISCELLANEOUS) IMPLANT
SUT PROLENE 0 CT 1 30 (SUTURE) ×15 IMPLANT
SUT SILK 2 0 (SUTURE) ×4
SUT SILK 2-0 18XBRD TIE 12 (SUTURE) ×6 IMPLANT
SUT SILK 3-0 (SUTURE) ×5 IMPLANT
SUT VIC AB 0 CT1 36 (SUTURE) IMPLANT
SUT VIC AB 2-0 BRD 54 (SUTURE) ×5 IMPLANT
SUT VIC AB 2-0 CT1 27 (SUTURE) ×4
SUT VIC AB 2-0 CT1 TAPERPNT 27 (SUTURE) ×6 IMPLANT
SUT VIC AB 3-0 54X BRD REEL (SUTURE) ×3 IMPLANT
SUT VIC AB 3-0 BRD 54 (SUTURE) ×2
SUT VIC AB 3-0 SH 27 (SUTURE) ×2
SUT VIC AB 3-0 SH 27X BRD (SUTURE) ×3 IMPLANT
SUT VIC AB 4-0 FS2 27 (SUTURE) ×10 IMPLANT
SYR BULB IRRIG 60ML STRL (SYRINGE) ×5 IMPLANT
TOWEL OR 17X26 4PK STRL BLUE (TOWEL DISPOSABLE) IMPLANT
TROCAR XCEL NON-BLD 11X100MML (ENDOMECHANICALS) ×5 IMPLANT
TROCAR XCEL NON-BLD 5MMX100MML (ENDOMECHANICALS) ×10 IMPLANT
TROCAR XCEL UNIV SLVE 11M 100M (ENDOMECHANICALS) ×10 IMPLANT
TUBING INSUF HEATED (TUBING) ×5 IMPLANT

## 2017-06-17 NOTE — Transfer of Care (Signed)
Immediate Anesthesia Transfer of Care Note  Patient: Stephen Yu  Procedure(s) Performed: Procedure(s): HERNIA REPAIR UMBILICAL ADULT PARTIAL COLECTOMY (Right)  Patient Location: PACU  Anesthesia Type:General  Level of Consciousness: sedated and responds to stimulation  Airway & Oxygen Therapy: Patient Spontanous Breathing and Patient connected to face mask oxygen  Post-op Assessment: Report given to RN and Post -op Vital signs reviewed and stable  Post vital signs: Reviewed and stable  Last Vitals:  Vitals:   06/17/17 0614 06/17/17 1134  BP: 128/75 139/85  Pulse: 78 90  Resp: 16 11  Temp: 36.6 C     Last Pain:  Vitals:   06/17/17 0614  TempSrc: Oral  PainSc: 8       Patients Stated Pain Goal: 2 (17/40/81 4481)  Complications: No apparent anesthesia complications

## 2017-06-17 NOTE — Anesthesia Procedure Notes (Signed)
Procedure Name: Intubation Performed by: Lance Muss Pre-anesthesia Checklist: Patient identified, Patient being monitored, Timeout performed, Emergency Drugs available and Suction available Patient Re-evaluated:Patient Re-evaluated prior to induction Oxygen Delivery Method: Circle system utilized Preoxygenation: Pre-oxygenation with 100% oxygen Induction Type: IV induction Ventilation: Mask ventilation without difficulty Laryngoscope Size: Mac and 3 Grade View: Grade I Tube type: Oral Tube size: 7.5 mm Number of attempts: 1 Airway Equipment and Method: Stylet Placement Confirmation: ETT inserted through vocal cords under direct vision,  positive ETCO2 and breath sounds checked- equal and bilateral Secured at: 23 cm Tube secured with: Tape Dental Injury: Teeth and Oropharynx as per pre-operative assessment

## 2017-06-17 NOTE — Anesthesia Post-op Follow-up Note (Cosign Needed)
Anesthesia QCDR form completed.        

## 2017-06-17 NOTE — Interval H&P Note (Signed)
History and Physical Interval Note:  06/17/2017 7:12 AM  Stephen Yu  has presented today for surgery, with the diagnosis of POLYP TRANSVERSE COLON  The various methods of treatment have been discussed with the patient and family. After consideration of risks, benefits and other options for treatment, the patient has consented to  Procedure(s): LAPAROSCOPIC PARTIAL COLECTOMY (N/A) as a surgical intervention .  The patient's history has been reviewed, patient examined, no change in status, stable for surgery.  I have reviewed the patient's chart and labs.  Questions were answered to the patient's satisfaction.     [name]

## 2017-06-17 NOTE — Op Note (Signed)
Preop diagnosis: Adenomatous polyp in the transverse colon and umbilical hernia  Post op diagnosis: Adenomatous polyp of the proximal transverse colon and umbilical hernia  Operation: Laparoscopy conversion to an open right colectomy and repair umbilical hernia  Surgeon: Mckinley Jewel R  Assistant: Arvilla Meres BSN, FA   Anesthesia: Gen.  Complications:: None  EBL: Proximally 500 mL replaced mainly with crystalloid  Drains: None  Description: This patient was found to have an adenomatous polyp that was not resectable via colonoscope located in the transverse colon. He also had a 2 cm umbilical hernia. He was put to sleep and a Foley catheter was inserted and the abdomen prepped and draped sterile field. Initial attempt was made to place a  port through the umbilical hernia site. An incision was made overlying this hernia vertically and the hernia sac was dissected and opened but contained a fair amount of fat that was adherent and it was really hard to find an open peritoneal space to allow for a pneumoperitoneum. It appeared there were some significant adhesions surrounding this area. Accordingly area left upper quadrant stab incision was made and a Veress needle position in the peritoneal cavity verified of the hanging drop method. Pneumoperitoneum was obtained followed by placement of 11 millimeter port. The camera in place attempt was made to look at the hernia site first and noted there was a sleeve of the omentum was firmly adherent into the sac area 85 mm port was placed in the left lower quadrant avoiding the placed the intrathecal pump in the left mid abdominal region. Presence of the intrathecal pump precluded placement of a port in the left mid abdomen to allow for more easy manipulation. The omental herniation was a pullback with the use of a light pressure and traction and also a free up the adhesion with the harmonic device. After this was carefully freed the fascial opening was  identified and a 11 mm port was placed in the site. Further attempts were made to visualize the right colon and transverse colon but then in could not be identified to know where exactly the polyp was located was also noted that there was significant amount of binding down of the right colon and the hepatic flexure and the terminal ileum and appendix in of vaguely that was difficult to evaluate this carefully with the laparoscope. Given this the fact that the ink could not be easily identified was decided to open. The left side of ports removed as also the epigastric port and the skin incision for the umbilicus was extended up to the epigastric port site. This was then deepened through the layers into the peritoneal cavity. In the area hernia patient had a very large sac that seemed to be adherent to the subcutaneous tissue in all directions probably measuring about the 45 mm diameter. The sac was somewhat subsequently excised out fully down to the fascial level. A wound protector was then placed in the procedure continued to look for the presence of the 8. The mid and distal transverse colon appeared to have any ink whatsoever. Therefore the omentum was mobilized and then very carefully the right colon was mobilized along with with the hepatic flexure using the harmonic device.Subsequently   a small focus of ink was noted at the proximal   transverse colon. Palpation didn't reveal the presence of a to determine half centimeter polypoid structure a formal right hemicolectomy was then planned. The terminal ileum was also stuck in the pelvic area very carefully  with exposure of these adhesions were taken down the right colon was mobilized from the lateral gutter hepatic flexure was completely freed until the duodenum was identified. Once this was accomplished resection was performed the side of the proximal and distal transection were marked and the mesentery was opened. The mesentery was then taken down with the use  of the harmonic device and ligatures of 2-0 Vicryl down to the main blood supply reduced the ileal right colic artery. This was doubly clamped cut and ligated with 0 silk. A side-to-side anastomosis with the GIA was then performed to the terminal ileum and the mid transverse colon. In the remaining opening closed and the bowel transected with the use second use of the GIA. The resected right colon was then based on the side table. The staple lines were reinforced with 3-0 silk stitches and the corners. Mesentery was closed with a running 3-0 Vicryl. There was considerable oozing and bleeding the course of dissection requiring careful dissection and ligature and/or cautery and the use of harmonic throughout. Blood loss was about 500 and replaced mainly with crystalloid. At this point the gloves and gowns were changed the area around the wound was redraped with sterile towels with any fresh set of instruments were used for the closure. The abdominal cavity was irrigated with the saline and all fluid suctioned out. The peritoneum was approximated with a running 2-0 Vicryl stitch. The fascia closed with interrupted figure-of-eight stitches of 0 proline. This incorporated the umbilical defect. Wound was irrigated and subcutaneous tissue closed with running 2-0 Vicryl. The skin was closed with staples. The 2 port sites on the left side of the abdomen closed subcuticular 4-0 Vicryl reinforced with Steri-Strips. A honeycomb dressing was placed on the main incision and the 2 small LAD Telfa Tegaderm dressings place of the port sites. Patient was subsequently extubated and returned recovery room stable condition

## 2017-06-17 NOTE — H&P (Signed)
Stephen Yu is an 61 y.o. male.   Chief Complaint:Polyp in colonHPI: 61 yr old male  Was found to have a large polyp in transverse colon-not amenable to excision via scope. Pathology showed this to be an adenomatous polyp. He was scheduled for surgery 4 weeks ago but was postponed because of upper respiratory symptoms. He denies any residual cough, sob or sputum production.. No GI complaints  Past Medical History:  Diagnosis Date  . Anxiety   . Arthritis   . Chronic pain 1996   due to work injury  . Colon polyp   . Cough    THAT WONT GO AWAY-PT TO SEE DR Doy Hutching ON 06-10-17 @ 4 PM  . Depression   . Diabetes mellitus without complication ([place])   . Dysrhythmia    AFIB X 1  . GERD (gastroesophageal reflux disease)   . Herniated lumbar intervertebral disc   . Hypertension   . Hypothyroidism   . Neck pain   . Presence of intrathecal pump    Dr. Mechele Dawley in [place], [place]    Past Surgical History:  Procedure Laterality Date  . COLONOSCOPY  2013   [place]  . COLONOSCOPY WITH PROPOFOL N/A 04/22/2017   Procedure: COLONOSCOPY WITH PROPOFOL;  Surgeon: Lollie Sails, MD;  Location: [facility] END[facility]COPY;  Service: Endoscopy;  Laterality: N/A;  . IMAGE GUIDED SINUS SURGERY    . implanted morphine pump  2016  . LAPAROSCOPIC CHOLECYSTECTOMY  2013  . UMBILICAL HERNIA REPAIR  2013  . VASECTOMY      Family History  Problem Relation Age of Onset  . Breast cancer Cousin 7  . Cancer - Colon Neg Hx   . Prostate cancer Neg Hx    Social History:  reports that he quit smoking about 17 years ago. His smoking use included Cigarettes. He quit after 20.00 years of use. He has quit using smokeless tobacco. He reports that he does not drink alcohol or use drugs.  Allergies:  Allergies  Allergen Reactions  . Biaxin [Clarithromycin] Other (See Comments)    Severe hiccups  . Ceftin [Cefuroxime Axetil] Other (See Comments)    Severe hiccups  . Penicillins Hives    Has patient  had a PCN reaction causing immediate rash, facial/tongue/throat swelling, SOB or lightheadedness with hypotension: No Has patient had a PCN reaction causing severe rash involving mucus membranes or skin necrosis: unknown Has patient had a PCN reaction that required hospitalization: No Has patient had a PCN reaction occurring within the last 10 years: No If all of the above answers are "NO", then may proceed with Cephalosporin use.     Medications Prior to Admission  Medication Sig Dispense Refill  . amitriptyline (ELAVIL) 100 MG tablet Take 50 mg by mouth at bedtime as needed for sleep.     . Cholecalciferol (VITAMIN D3) 5000 units TABS Take 5,000 Units by mouth daily.     . citalopram (CELEXA) 40 MG tablet Take 40 mg by mouth every morning.     Marland Kitchen esomeprazole (NEXIUM) 40 MG capsule Take 40 mg by mouth daily as needed.     . glyBURIDE-metformin (GLUCOVANCE) 5-500 MG tablet Take 0.5 tablets by mouth See admin instructions. Takes 0.5 tablet twice daily in morning and bedtime and takes a 3rd dose only if needed for elevated blood sugar    . HYDROcodone-acetaminophen (NORCO) 7.5-325 MG tablet Take 1 tablet by mouth every 6 (six) hours as needed for moderate pain.     . Insulin  Glargine (BASAGLAR KWIKPEN) 100 UNIT/ML SOPN Inject 40 Units into the skin daily before breakfast.     . KRILL OIL PO Take 1 capsule by mouth daily.     Marland Kitchen lisinopril-hydrochlorothiazide (PRINZIDE,ZESTORETIC) 20-25 MG tablet Take 0.5 tablets by mouth daily.     . MORPHINE SULFATE IJ Inject as directed. Morphine 4mg /ml epidural pump. 1.4897 mg/day    . neomycin (MYCIFRADIN) 500 MG tablet Take 1 tablet (500 mg total) by mouth See admin instructions. Take 2 tablets at 6 pm, then take 2 tablets at 11 pm. 4 tablet 0  . niacin 500 MG tablet Take 500 mg by mouth at bedtime.    Marland Kitchen oxymetazoline (AFRIN) 0.05 % nasal spray Place 1 spray into both nostrils 2 (two) times daily as needed for congestion.    . pregabalin (LYRICA) 150 MG  capsule Take 150 mg by mouth 2 (two) times daily.    . sodium chloride (OCEAN) 0.65 % SOLN nasal spray Place 1 spray into both nostrils 4 (four) times daily as needed for congestion.     Marland Kitchen thyroid (ARMOUR) 32.5 MG tablet Take 32.5 mg by mouth every morning.     Marland Kitchen tiZANidine (ZANAFLEX) 4 MG tablet Take 4 mg by mouth every 8 (eight) hours as needed for muscle spasms.    . vitamin E (VITAMIN E) 400 UNIT capsule Take 400 Units by mouth daily.    Marland Kitchen zinc gluconate 50 MG tablet Take 50 mg by mouth daily.    . metroNIDAZOLE (FLAGYL) 500 MG tablet Take 1 tablet (500 mg total) by mouth See admin instructions. Take 1 tablet at 6 pm and take 1 tablet at 11 pm (Patient not taking: Reported on 06/17/2017) 2 tablet 0    Results for orders placed or performed during the hospital encounter of 06/17/17 (from the past 48 hour(s))  Glucose, capillary     Status: Abnormal   Collection Time: 06/17/17  6:09 AM  Result Value Ref Range   Glucose-Capillary 149 (H) 65 - 99 mg/dL  I-STAT 4, (NA,K, GLUC, HGB,HCT)     Status: Abnormal   Collection Time: 06/17/17  6:39 AM  Result Value Ref Range   Sodium 136 135 - 145 mmol/L   Potassium 4.1 3.5 - 5.1 mmol/L   Glucose, Bld 156 (H) 65 - 99 mg/dL   HCT 43.0 39.0 - 52.0 %   Hemoglobin 14.6 13.0 - 17.0 g/dL   No results found.  Review of Systems  Constitutional: Negative.   Eyes: Negative.   Respiratory: Negative.   Cardiovascular: Negative.   Gastrointestinal: Negative.   Genitourinary: Negative.     Blood pressure 128/75, pulse 78, temperature 97.8 F (36.6 C), temperature source Oral, resp. rate 16, height 5\' 10"  (1.778 m), weight 225 lb (102.1 kg), SpO2 95 %. Physical Exam  Constitutional: He is oriented to person, place, and time. He appears well-developed and well-nourished.  HENT:  Head: Normocephalic.  Eyes: Conjunctivae are normal. No scleral icterus.  Neck: Neck supple.  Cardiovascular: Normal rate, regular rhythm and normal heart sounds.     Respiratory: Effort normal and breath sounds normal.  GI: Soft. Bowel sounds are normal. He exhibits no mass. There is no hepatomegaly. There is no tenderness. A hernia is present.    Lymphadenopathy:    He has no cervical adenopathy.  Neurological: He is alert and oriented to person, place, and time.  Skin: Skin is warm and dry.     Assessment/Plan Exam stable. Pt's questions answered. Proceed with planned  transverse colon resection and repair umbilical hernia at same time  Christene Lye, MD 06/17/2017, 7:06 AM

## 2017-06-17 NOTE — Anesthesia Postprocedure Evaluation (Signed)
Anesthesia Post Note  Patient: Stephen Yu  Procedure(s) Performed: Procedure(s) (LRB): HERNIA REPAIR UMBILICAL ADULT PARTIAL COLECTOMY (Right)  Patient location during evaluation: PACU Anesthesia Type: General Level of consciousness: awake and alert Pain management: pain level controlled Vital Signs Assessment: post-procedure vital signs reviewed and stable Respiratory status: spontaneous breathing and respiratory function stable Cardiovascular status: stable Anesthetic complications: no     Last Vitals:  Vitals:   06/17/17 1203 06/17/17 1220  BP: (!) 159/68 (!) 178/89  Pulse: 86 87  Resp: 10 12  Temp:      Last Pain:  Vitals:   06/17/17 1216  TempSrc:   PainSc: Asleep                 Stephen Yu

## 2017-06-17 NOTE — Anesthesia Preprocedure Evaluation (Signed)
Anesthesia Evaluation  Patient identified by MRN, date of birth, ID band Patient awake    Reviewed: Allergy & Precautions, NPO status , Patient's Chart, lab work & pertinent test results  History of Anesthesia Complications Negative for: history of anesthetic complications  Airway Mallampati: II       Dental   Pulmonary neg pulmonary ROS, former smoker,           Cardiovascular hypertension, Pt. on medications + dysrhythmias (hx of afib, no problems in years)      Neuro/Psych Anxiety Depression negative neurological ROS     GI/Hepatic Neg liver ROS, GERD  Medicated,  Endo/Other  diabetes, Type 2, Oral Hypoglycemic Agents, Insulin DependentHypothyroidism   Renal/GU negative Renal ROS     Musculoskeletal   Abdominal   Peds  Hematology   Anesthesia Other Findings   Reproductive/Obstetrics                             Anesthesia Physical Anesthesia Plan  ASA: III  Anesthesia Plan: General   Post-op Pain Management:    Induction:   PONV Risk Score and Plan: 3 and Ondansetron, Dexamethasone, Propofol and Midazolam  Airway Management Planned: Oral ETT  Additional Equipment:   Intra-op Plan:   Post-operative Plan:   Informed Consent: I have reviewed the patients History and Physical, chart, labs and discussed the procedure including the risks, benefits and alternatives for the proposed anesthesia with the patient or authorized representative who has indicated his/her understanding and acceptance.     Plan Discussed with:   Anesthesia Plan Comments:         Anesthesia Quick Evaluation

## 2017-06-18 ENCOUNTER — Encounter: Payer: Self-pay | Admitting: General Surgery

## 2017-06-18 LAB — BASIC METABOLIC PANEL
Anion gap: 6 (ref 5–15)
BUN: 15 mg/dL (ref 6–20)
CO2: 27 mmol/L (ref 22–32)
Calcium: 8.3 mg/dL — ABNORMAL LOW (ref 8.9–10.3)
Chloride: 103 mmol/L (ref 101–111)
Creatinine, Ser: 1.01 mg/dL (ref 0.61–1.24)
GFR calc Af Amer: >60 mL/min (ref >60)
GFR calc non Af Amer: >60 mL/min (ref >60)
Glucose, Bld: 159 mg/dL — ABNORMAL HIGH (ref 65–99)
Potassium: 3.8 mmol/L (ref 3.5–5.1)
Sodium: 136 mmol/L (ref 135–145)

## 2017-06-18 LAB — CBC
HCT: 37.6 % — ABNORMAL LOW (ref 40.0–52.0)
Hemoglobin: 12.9 g/dL — ABNORMAL LOW (ref 13.0–18.0)
MCH: 29.7 pg (ref 26.0–34.0)
MCHC: 34.4 g/dL (ref 32.0–36.0)
MCV: 86.3 fL (ref 80.0–100.0)
Platelets: 222 10*3/uL (ref 150–440)
RBC: 4.36 MIL/uL — ABNORMAL LOW (ref 4.40–5.90)
RDW: 13.6 % (ref 11.5–14.5)
WBC: 13.6 10*3/uL — ABNORMAL HIGH (ref 3.8–10.6)

## 2017-06-18 LAB — GLUCOSE, CAPILLARY
Glucose-Capillary: 217 mg/dL — ABNORMAL HIGH (ref 65–99)
Glucose-Capillary: 185 mg/dL — ABNORMAL HIGH (ref 65–99)
Glucose-Capillary: 175 mg/dL — ABNORMAL HIGH (ref 65–99)
Glucose-Capillary: 153 mg/dL — ABNORMAL HIGH (ref 65–99)

## 2017-06-18 LAB — SURGICAL PATHOLOGY

## 2017-06-18 MED ORDER — KETOROLAC TROMETHAMINE 30 MG/ML IJ SOLN
30.0000 mg | Freq: Three times a day (TID) | INTRAMUSCULAR | Status: AC
  Administered 2017-06-18 – 2017-06-20 (×6): 30 mg via INTRAVENOUS
  Filled 2017-06-18 (×6): qty 1

## 2017-06-18 NOTE — Progress Notes (Signed)
Inpatient Diabetes Program Recommendations  AACE/ADA: New Consensus Statement on Inpatient Glycemic Control (2015)  Target Ranges:  Prepandial:   less than 140 mg/dL      Peak postprandial:   less than 180 mg/dL (1-2 hours)      Critically ill patients:  140 - 180 mg/dL   Results for Stephen Yu, Stephen Yu (MRN [id number]) as of 06/18/2017 09:41  Ref. Range 06/17/2017 06:09 06/17/2017 11:37 06/17/2017 16:33 06/17/2017 21:05 06/18/2017 07:39  Glucose-Capillary Latest Ref Range: 65 - 99 mg/dL 149 (H) 227 (H) 265 (H) 201 (H) 217 (H)   Review of Glycemic Control  Diabetes history: DM2 Outpatient Diabetes medications: Basaglar 40 units QAM, Glucovance 2.5-250 mg BID Current orders for Inpatient glycemic control: Novolog 0-15 units TID with meals, Novolog 0-5 units QHS  Inpatient Diabetes Program Recommendations: Insulin - Basal: If glucose continues to be consistently greater than 180 mg/dl, please consider ordering Lantus 10 units Q24H.  Thanks, Barnie Alderman, RN, MSN, CDE Diabetes Coordinator Inpatient Diabetes Program [phone] (Team Pager from 8am to 5pm)

## 2017-06-18 NOTE — Progress Notes (Signed)
Patient ID: Stephen Yu, male   DOB: 08-Feb-1956, 61 y.o.   MRN: [id number] Pt reports pain-mostly gas related. He is otherwise doing well. Has been up and walked. AVSS-HR at 104, likely from pain. Lungs clear. Abdomen- incision is clean, soft. Hypoactive bowel sounds. Good u/o. Labs are stable. Stable course. Trial of Toradol for pain

## 2017-06-19 LAB — CBC WITH DIFFERENTIAL/PLATELET
Basophils Absolute: 0 10*3/uL (ref 0–0.1)
Basophils Relative: 0 %
Eosinophils Absolute: 0.3 10*3/uL (ref 0–0.7)
Eosinophils Relative: 3 %
HCT: 30.1 % — ABNORMAL LOW (ref 40.0–52.0)
Hemoglobin: 10.3 g/dL — ABNORMAL LOW (ref 13.0–18.0)
Lymphocytes Relative: 24 %
Lymphs Abs: 2.8 10*3/uL (ref 1.0–3.6)
MCH: 29.5 pg (ref 26.0–34.0)
MCHC: 34.2 g/dL (ref 32.0–36.0)
MCV: 86.4 fL (ref 80.0–100.0)
Monocytes Absolute: 1.2 10*3/uL — ABNORMAL HIGH (ref 0.2–1.0)
Monocytes Relative: 10 %
Neutro Abs: 7.3 10*3/uL — ABNORMAL HIGH (ref 1.4–6.5)
Neutrophils Relative %: 63 %
Platelets: 171 10*3/uL (ref 150–440)
RBC: 3.49 MIL/uL — ABNORMAL LOW (ref 4.40–5.90)
RDW: 13.8 % (ref 11.5–14.5)
WBC: 11.6 10*3/uL — ABNORMAL HIGH (ref 3.8–10.6)

## 2017-06-19 LAB — GLUCOSE, CAPILLARY
Glucose-Capillary: 159 mg/dL — ABNORMAL HIGH (ref 65–99)
Glucose-Capillary: 195 mg/dL — ABNORMAL HIGH (ref 65–99)
Glucose-Capillary: 125 mg/dL — ABNORMAL HIGH (ref 65–99)
Glucose-Capillary: 188 mg/dL — ABNORMAL HIGH (ref 65–99)

## 2017-06-19 NOTE — Progress Notes (Signed)
Patient ID: [name], male   DOB: 1956/01/29, 61 y.o.   MRN: [id number] Pt is feeling better today, pain is improved Has been up to walk Had 1 watery BM yesterday Afebrile, VS stable, HR has decreased to 90's  Abd exam: Hyperactive bowel sounds, soft, incision is clean, dry, and intact Lungs: clear to ausc bilaterally  Good u/o, foley to be removed this am  Hemoglobin decreased to 10.3, likely equilibrating, no sign of active bleeding   Good progress, plan to advance diet, anticipate d/c in 1-2 days  Pathology: tubular adenoma with high grade dysplasia, 14 nodes were negative, pt advised.

## 2017-06-20 LAB — GLUCOSE, CAPILLARY
Glucose-Capillary: 249 mg/dL — ABNORMAL HIGH (ref 65–99)
Glucose-Capillary: 209 mg/dL — ABNORMAL HIGH (ref 65–99)

## 2017-06-20 NOTE — Progress Notes (Signed)
Patient discharge teaching given, including activity, diet, follow-up appoints, and medications. Patient verbalized understanding of all discharge instructions. IV access was d/c'd. Vitals are stable. Skin is intact except as charted in most recent assessments. Pt to be escorted out by volunteer, to be driven home by family.  Savon Bordonaro  

## 2017-06-20 NOTE — Discharge Summary (Signed)
Physician Discharge Summary  Patient ID: Stephen Yu MRN: [id number] DOB/AGE: [birth year] 61 y.o.  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 Umbilical hernia Active Problems:   Polyp of transverse colon   Discharged Condition: good  Hospital Course: 61 year old male was found on recent colonoscopy to have a large polyp in the proximal transverse colon and was not amenable to resection of the scope. Biopsy showed this to be an adenoma. A formal resection was recommended patient was brought in for the same. Pertinent history includes that of chronic pain related to his back and the patient has been managed with intrathecal pump and being followed by pain management service. On 06/17/2017 the patient underwent right colectomy and also had repair of umbilical hernia same time. Postoperative course was essentially uncomplicated. He had return of bowel activity in the second postoperative day and was started on the clear liquid diet and advanced to soft diet prior to discharge. Path report revealed a tubular adenoma with significant high-grade dysplasia. Lymph nodes totaling 14 were negative Patient has been advised on the pathology report. This also been advised some on discharge instructions to include the no exertional activity. He may shower. Follow-up being arranged for next week Consults: None  Significant Diagnostic Studies: none  Treatments: surgery: Laparotomy and right hemicolectomy and repair umbilical hernia  Discharge Exam: Blood pressure 114/62, pulse 95, temperature 98.2 F (36.8 C), temperature source Oral, resp. rate 18, height 5\' 10"  (1.778 m), weight 225 lb (102.1 kg), SpO2 98 %. GI: soft, non-tender; bowel sounds normal; no masses,  no organomegaly. Incision and port sites are clean and intact. Lungs are  clear.  Disposition: 01-Home or Self Care  Discharge Instructions    Call MD for:  persistant nausea and vomiting    Complete by:  As directed    Call MD for:  redness, tenderness, or signs of infection (pain, swelling, redness, odor or green/yellow discharge around incision site)    Complete by:  As directed    Call MD for:  severe uncontrolled pain    Complete by:  As directed    Call MD for:  temperature >100.4    Complete by:  As directed    Diet Carb Modified    Complete by:  As directed    Discharge instructions    Complete by:  As directed    No exertional activity. May shower. Removal Steri-Strips on the left side of abdomen and they come loose. Staples will be removed this off visit next week     Allergies as of 06/20/2017      Reactions   Biaxin [clarithromycin] Other (See Comments)   Severe hiccups   Ceftin [cefuroxime Axetil] Other (See Comments)   Severe hiccups   Penicillins Hives   Has patient had a PCN reaction causing immediate rash, facial/tongue/throat swelling, SOB or lightheadedness with hypotension: No Has patient had a PCN reaction causing severe rash involving mucus membranes or skin necrosis: unknown Has patient had a PCN reaction that required hospitalization: No Has patient had a PCN reaction occurring within the last 10 years: No If all of the above answers are "NO", then may proceed with Cephalosporin use.      Medication List    TAKE these medications   amitriptyline 100 MG tablet Commonly known as:  ELAVIL Take 50 mg by mouth at bedtime as needed for sleep.   BASAGLAR  KWIKPEN 100 UNIT/ML Sopn Inject 40 Units into the skin daily before breakfast.   citalopram 40 MG tablet Commonly known as:  CELEXA Take 40 mg by mouth every morning.   esomeprazole 40 MG capsule Commonly known as:  NEXIUM Take 40 mg by mouth daily as needed.   glyBURIDE-metformin 5-500 MG tablet Commonly known as:  GLUCOVANCE Take 0.5 tablets by mouth See admin  instructions. Takes 0.5 tablet twice daily in morning and bedtime and takes a 3rd dose only if needed for elevated blood sugar   HYDROcodone-acetaminophen 7.5-325 MG tablet Commonly known as:  NORCO Take 1 tablet by mouth every 6 (six) hours as needed for moderate pain.   KRILL OIL PO Take 1 capsule by mouth daily.   lisinopril-hydrochlorothiazide 20-25 MG tablet Commonly known as:  PRINZIDE,ZESTORETIC Take 0.5 tablets by mouth daily.   metroNIDAZOLE 500 MG tablet Commonly known as:  FLAGYL Take 1 tablet (500 mg total) by mouth See admin instructions. Take 1 tablet at 6 pm and take 1 tablet at 11 pm   MORPHINE SULFATE IJ Inject as directed. Morphine 4mg /ml epidural pump. 1.4897 mg/day   neomycin 500 MG tablet Commonly known as:  MYCIFRADIN Take 1 tablet (500 mg total) by mouth See admin instructions. Take 2 tablets at 6 pm, then take 2 tablets at 11 pm.   niacin 500 MG tablet Take 500 mg by mouth at bedtime.   oxymetazoline 0.05 % nasal spray Commonly known as:  AFRIN Place 1 spray into both nostrils 2 (two) times daily as needed for congestion.   pregabalin 150 MG capsule Commonly known as:  LYRICA Take 150 mg by mouth 2 (two) times daily.   sodium chloride 0.65 % Soln nasal spray Commonly known as:  OCEAN Place 1 spray into both nostrils 4 (four) times daily as needed for congestion.   thyroid 32.5 MG tablet Commonly known as:  ARMOUR Take 32.5 mg by mouth every morning.   tiZANidine 4 MG tablet Commonly known as:  ZANAFLEX Take 4 mg by mouth every 8 (eight) hours as needed for muscle spasms.   Vitamin D3 5000 units Tabs Take 5,000 Units by mouth daily.   vitamin E 400 UNIT capsule Generic drug:  vitamin E Take 400 Units by mouth daily.   zinc gluconate 50 MG tablet Take 50 mg by mouth daily.      Follow-up Information    Christene Lye, MD Follow up in 5 day(s).   Specialties:  General Surgery, Radiology Contact information: [place] [id number] [phone]           Signed: Christene Lye 06/20/2017, 11:14 AM

## 2017-06-25 ENCOUNTER — Ambulatory Visit (INDEPENDENT_AMBULATORY_CARE_PROVIDER_SITE_OTHER): Payer: Federal, State, Local not specified - PPO | Admitting: General Surgery

## 2017-06-25 ENCOUNTER — Encounter: Payer: Self-pay | Admitting: General Surgery

## 2017-06-25 VITALS — BP 142/76 | HR 90 | Resp 14 | Ht 71.0 in | Wt 220.0 lb

## 2017-06-25 DIAGNOSIS — D369 Benign neoplasm, unspecified site: Secondary | ICD-10-CM

## 2017-06-25 NOTE — Patient Instructions (Signed)
Return in one month for surgical follow up

## 2017-06-25 NOTE — Progress Notes (Signed)
Patient ID: Stephen Yu, male   DOB: August 14, 1956, 61 y.o.   MRN: [id number]  Chief Complaint  Patient presents with  . Follow-up    HPI Jensen Kilburg is a 61 y.o. male here today for his post op colectomy done on 06/17/2017. Patient states he is still very sore but otherwise doing well. Moves his bowels daily.  HPI  Past Medical History:  Diagnosis Date  . Anxiety   . Arthritis   . Chronic pain 1996   due to work injury  . Colon polyp   . Cough    THAT WONT GO AWAY-PT TO SEE DR Doy Hutching ON 06-10-17 @ 4 PM  . Depression   . Diabetes mellitus without complication ([place])   . Dysrhythmia    AFIB X 1  . GERD (gastroesophageal reflux disease)   . Herniated lumbar intervertebral disc   . Hypertension   . Hypothyroidism   . Neck pain   . Presence of intrathecal pump    Dr. Mechele Dawley in [place], [place]    Past Surgical History:  Procedure Laterality Date  . COLONOSCOPY  2013   [place]  . COLONOSCOPY WITH PROPOFOL N/A 04/22/2017   Procedure: COLONOSCOPY WITH PROPOFOL;  Surgeon: Lollie Sails, MD;  Location: [facility] ENDOSCOPY;  Service: Endoscopy;  Laterality: N/A;  . IMAGE GUIDED SINUS SURGERY    . implanted morphine pump  2016  . LAPAROSCOPIC CHOLECYSTECTOMY  2013  . PARTIAL COLECTOMY Right 06/17/2017   Procedure: PARTIAL COLECTOMY;  Surgeon: Christene Lye, MD;  Location: [facility] ORS;  Service: General;  Laterality: Right;  . UMBILICAL HERNIA REPAIR  2013  . UMBILICAL HERNIA REPAIR  06/17/2017   Procedure: HERNIA REPAIR UMBILICAL ADULT;  Surgeon: Christene Lye, MD;  Location: [facility] ORS;  Service: General;;  . VASECTOMY      Family History  Problem Relation Age of Onset  . Breast cancer Cousin 18  . Cancer - Colon Neg Hx   . Prostate cancer Neg Hx     Social History Social History  Substance Use Topics  . Smoking status: Former Smoker    Years: 20.00    Types: Cigarettes    Quit date: 12/03/1999  . Smokeless tobacco: Former Systems developer  .  Alcohol use No    Allergies  Allergen Reactions  . Biaxin [Clarithromycin] Other (See Comments)    Severe hiccups  . Ceftin [Cefuroxime Axetil] Other (See Comments)    Severe hiccups  . Penicillins Hives    Has patient had a PCN reaction causing immediate rash, facial/tongue/throat swelling, SOB or lightheadedness with hypotension: No Has patient had a PCN reaction causing severe rash involving mucus membranes or skin necrosis: unknown Has patient had a PCN reaction that required hospitalization: No Has patient had a PCN reaction occurring within the last 10 years: No If all of the above answers are "NO", then may proceed with Cephalosporin use.     Current Outpatient Prescriptions  Medication Sig Dispense Refill  . amitriptyline (ELAVIL) 100 MG tablet Take 50 mg by mouth at bedtime as needed for sleep.     . Cholecalciferol (VITAMIN D3) 5000 units TABS Take 5,000 Units by mouth daily.     . citalopram (CELEXA) 40 MG tablet Take 40 mg by mouth every morning.     Marland Kitchen esomeprazole (NEXIUM) 40 MG capsule Take 40 mg by mouth daily as needed.     . glyBURIDE-metformin (GLUCOVANCE) 5-500 MG tablet Take 0.5 tablets by mouth See admin instructions. Takes  0.5 tablet twice daily in morning and bedtime and takes a 3rd dose only if needed for elevated blood sugar    . HYDROcodone-acetaminophen (NORCO) 7.5-325 MG tablet Take 1 tablet by mouth every 6 (six) hours as needed for moderate pain.     . Insulin Glargine (BASAGLAR KWIKPEN) 100 UNIT/ML SOPN Inject 40 Units into the skin daily before breakfast.     . KRILL OIL PO Take 1 capsule by mouth daily.     Marland Kitchen lisinopril-hydrochlorothiazide (PRINZIDE,ZESTORETIC) 20-25 MG tablet Take 0.5 tablets by mouth daily.     . metroNIDAZOLE (FLAGYL) 500 MG tablet Take 1 tablet (500 mg total) by mouth See admin instructions. Take 1 tablet at 6 pm and take 1 tablet at 11 pm 2 tablet 0  . MORPHINE SULFATE IJ Inject as directed. Morphine 4mg /ml epidural pump. 1.4897  mg/day    . neomycin (MYCIFRADIN) 500 MG tablet Take 1 tablet (500 mg total) by mouth See admin instructions. Take 2 tablets at 6 pm, then take 2 tablets at 11 pm. 4 tablet 0  . niacin 500 MG tablet Take 500 mg by mouth at bedtime.    Marland Kitchen oxymetazoline (AFRIN) 0.05 % nasal spray Place 1 spray into both nostrils 2 (two) times daily as needed for congestion.    . pregabalin (LYRICA) 150 MG capsule Take 150 mg by mouth 2 (two) times daily.    . sodium chloride (OCEAN) 0.65 % SOLN nasal spray Place 1 spray into both nostrils 4 (four) times daily as needed for congestion.     Marland Kitchen thyroid (ARMOUR) 32.5 MG tablet Take 32.5 mg by mouth every morning.     Marland Kitchen tiZANidine (ZANAFLEX) 4 MG tablet Take 4 mg by mouth every 8 (eight) hours as needed for muscle spasms.    . vitamin E (VITAMIN E) 400 UNIT capsule Take 400 Units by mouth daily.    Marland Kitchen zinc gluconate 50 MG tablet Take 50 mg by mouth daily.     No current facility-administered medications for this visit.     Review of Systems Review of Systems  Constitutional: Negative.   Respiratory: Negative.   Cardiovascular: Negative.     Blood pressure (!) 142/76, pulse 90, resp. rate 14, height 5\' 11"  (1.803 m), weight 220 lb (99.8 kg).  Physical Exam Physical Exam  Constitutional: He is oriented to person, place, and time. He appears well-developed and well-nourished.  Cardiovascular: Normal rate, regular rhythm and normal heart sounds.   Abdominal: Soft. Bowel sounds are normal. He exhibits no distension. There is no tenderness. No hernia.    Neurological: He is alert and oriented to person, place, and time.  Skin: Skin is warm and dry.    Data Reviewed Prior notes, op note, pathology   Assessment  Polyp of transverse colon, right colon resection - pathology showed tubular adenoma with high grade dysplasia, one week post-op and doing well.   Umbilical hernia, repaired at time of surgery      Plan Staples removed in office, steri-strips  applied to surgical incision    Follow-up in one month No exertional activity     HPI, Physical Exam, Assessment and Plan have been scribed under the direction and in the presence of Mckinley Jewel, MD  Gaspar Cola, CMA  I have completed the exam and reviewed the above documentation for accuracy and completeness.  I agree with the above.  Haematologist has been used and any errors in dictation or transcription are unintentional.  Wen Munford G. Jamal Collin,  M.D., F.A.C.S.  Junie Panning G 06/25/2017, 11:19 AM

## 2017-07-03 ENCOUNTER — Ambulatory Visit (INDEPENDENT_AMBULATORY_CARE_PROVIDER_SITE_OTHER): Payer: Federal, State, Local not specified - PPO

## 2017-07-03 DIAGNOSIS — D369 Benign neoplasm, unspecified site: Secondary | ICD-10-CM

## 2017-07-03 NOTE — Progress Notes (Signed)
Patient ID: Stephen Yu, male   DOB: Feb 12, 1956, 61 y.o.   MRN: [id number]  Here for follow up from colectomy and umbilical hernia repair. He states that he has some drainage from the incision site. He reports that on 07/01/17 he had a 2 x 2 inch area of dark red drainage on his dressing. He reports some very small amount of the same the next evening. He states that he has had none since then.   Dressing removed and no drainage noted. The incision looks clean with no drainage noted. Wound edges are approximated and no signs of infections noted. The patient denies any pain in the area and has not needed any prescription pain medication for this. He reports that he is moving his bowels well. Patient to follow up as scheduled and to call with any other concerns.

## 2017-07-21 ENCOUNTER — Encounter: Payer: Self-pay | Admitting: General Surgery

## 2017-07-29 ENCOUNTER — Encounter: Payer: Self-pay | Admitting: General Surgery

## 2017-07-29 ENCOUNTER — Ambulatory Visit (INDEPENDENT_AMBULATORY_CARE_PROVIDER_SITE_OTHER): Payer: Federal, State, Local not specified - PPO | Admitting: General Surgery

## 2017-07-29 VITALS — BP 142/78 | HR 72 | Resp 12 | Ht 71.0 in | Wt 234.0 lb

## 2017-07-29 DIAGNOSIS — D122 Benign neoplasm of ascending colon: Secondary | ICD-10-CM

## 2017-07-29 NOTE — Patient Instructions (Addendum)
The patient is aware to call back for any questions or new concerns. colonoscopy due 2019.

## 2017-07-29 NOTE — Progress Notes (Signed)
Patient ID: Stephen Yu, male   DOB: [birth year], 61 y.o.   MRN: [id number]  Chief Complaint  Patient presents with  . Follow-up    HPI Stephen Yu is a 61 y.o. male here today for his follow up right colectomy and umbilical hernia done on 06/17/2017. Patient states the area is tender but otherwise doing well. Moves his bowels daily.  HPI  Past Medical History:  Diagnosis Date  . Anxiety   . Arthritis   . Chronic pain 1996   due to work injury  . Colon polyp   . Cough    THAT WONT GO AWAY-PT TO SEE DR Doy Hutching ON 06-10-17 @ 4 PM  . Depression   . Diabetes mellitus without complication ([place])   . Dysrhythmia    AFIB X 1  . GERD (gastroesophageal reflux disease)   . Herniated lumbar intervertebral disc   . Hypertension   . Hypothyroidism   . Neck pain   . Presence of intrathecal pump    Dr. Mechele Dawley in [place], [place]    Past Surgical History:  Procedure Laterality Date  . COLONOSCOPY  2013   [place]  . COLONOSCOPY WITH PROPOFOL N/A 04/22/2017   Procedure: COLONOSCOPY WITH PROPOFOL;  Surgeon: Lollie Sails, MD;  Location: [facility] ENDOSCOPY;  Service: Endoscopy;  Laterality: N/A;  . IMAGE GUIDED SINUS SURGERY    . implanted morphine pump  2016  . LAPAROSCOPIC CHOLECYSTECTOMY  2013  . PARTIAL COLECTOMY Right 06/17/2017   Procedure: PARTIAL COLECTOMY;  Surgeon: Christene Lye, MD;  Location: [facility] ORS;  Service: General;  Laterality: Right;  . UMBILICAL HERNIA REPAIR  2013  . UMBILICAL HERNIA REPAIR  06/17/2017   Procedure: HERNIA REPAIR UMBILICAL ADULT;  Surgeon: Christene Lye, MD;  Location: [facility] ORS;  Service: General;;  . VASECTOMY      Family History  Problem Relation Age of Onset  . Breast cancer Cousin 66  . Cancer - Colon Neg Hx   . Prostate cancer Neg Hx     Social History Social History  Substance Use Topics  . Smoking status: Former Smoker    Years: 20.00    Types: Cigarettes    Quit date: 12/03/1999  . Smokeless  tobacco: Former Systems developer  . Alcohol use No    Allergies  Allergen Reactions  . Biaxin [Clarithromycin] Other (See Comments)    Severe hiccups  . Ceftin [Cefuroxime Axetil] Other (See Comments)    Severe hiccups  . Penicillins Hives    Has patient had a PCN reaction causing immediate rash, facial/tongue/throat swelling, SOB or lightheadedness with hypotension: No Has patient had a PCN reaction causing severe rash involving mucus membranes or skin necrosis: unknown Has patient had a PCN reaction that required hospitalization: No Has patient had a PCN reaction occurring within the last 10 years: No If all of the above answers are "NO", then may proceed with Cephalosporin use.     Current Outpatient Prescriptions  Medication Sig Dispense Refill  . amitriptyline (ELAVIL) 100 MG tablet Take 50 mg by mouth at bedtime as needed for sleep.     . Cholecalciferol (VITAMIN D3) 5000 units TABS Take 5,000 Units by mouth daily.     . citalopram (CELEXA) 40 MG tablet Take 40 mg by mouth every morning.     Marland Kitchen esomeprazole (NEXIUM) 40 MG capsule Take 40 mg by mouth daily as needed.     . glyBURIDE-metformin (GLUCOVANCE) 5-500 MG tablet Take 0.5 tablets by mouth See  admin instructions. Takes 0.5 tablet twice daily in morning and bedtime and takes a 3rd dose only if needed for elevated blood sugar    . HYDROcodone-acetaminophen (NORCO) 7.5-325 MG tablet Take 1 tablet by mouth every 6 (six) hours as needed for moderate pain.     . Insulin Glargine (BASAGLAR KWIKPEN) 100 UNIT/ML SOPN Inject 40 Units into the skin daily before breakfast.     . KRILL OIL PO Take 1 capsule by mouth daily.     Marland Kitchen lisinopril-hydrochlorothiazide (PRINZIDE,ZESTORETIC) 20-25 MG tablet Take 0.5 tablets by mouth daily.     . metroNIDAZOLE (FLAGYL) 500 MG tablet Take 1 tablet (500 mg total) by mouth See admin instructions. Take 1 tablet at 6 pm and take 1 tablet at 11 pm 2 tablet 0  . MORPHINE SULFATE IJ Inject as directed. Morphine 4mg /ml  epidural pump. 1.4897 mg/day    . neomycin (MYCIFRADIN) 500 MG tablet Take 1 tablet (500 mg total) by mouth See admin instructions. Take 2 tablets at 6 pm, then take 2 tablets at 11 pm. 4 tablet 0  . niacin 500 MG tablet Take 500 mg by mouth at bedtime.    Marland Kitchen oxymetazoline (AFRIN) 0.05 % nasal spray Place 1 spray into both nostrils 2 (two) times daily as needed for congestion.    . pregabalin (LYRICA) 150 MG capsule Take 150 mg by mouth 2 (two) times daily.    . sodium chloride (OCEAN) 0.65 % SOLN nasal spray Place 1 spray into both nostrils 4 (four) times daily as needed for congestion.     Marland Kitchen thyroid (ARMOUR) 32.5 MG tablet Take 32.5 mg by mouth every morning.     Marland Kitchen tiZANidine (ZANAFLEX) 4 MG tablet Take 4 mg by mouth every 8 (eight) hours as needed for muscle spasms.    . vitamin E (VITAMIN E) 400 UNIT capsule Take 400 Units by mouth daily.    Marland Kitchen zinc gluconate 50 MG tablet Take 50 mg by mouth daily.     No current facility-administered medications for this visit.     Review of Systems Review of Systems  Constitutional: Negative.   Respiratory: Negative.   Cardiovascular: Negative.     Blood pressure (!) 142/78, pulse 72, resp. rate 12, height 5\' 11"  (1.803 m), weight 234 lb (106.1 kg).  Physical Exam Physical Exam  Constitutional: He is oriented to person, place, and time. He appears well-developed and well-nourished.  HENT:  Mouth/Throat: Oropharynx is clear and moist.  Eyes: Conjunctivae are normal. No scleral icterus.  Neck: Neck supple.  Cardiovascular: Normal rate, regular rhythm and normal heart sounds.   Pulmonary/Chest: Effort normal and breath sounds normal.  Abdominal: Soft. Normal appearance and bowel sounds are normal. There is no tenderness. No hernia.    Lymphadenopathy:    He has no cervical adenopathy.  Neurological: He is alert and oriented to person, place, and time.  Skin: Skin is warm and dry.  Psychiatric: His behavior is normal.    Data  Reviewed Operative report and Progress notes  Assessment    Stable postoperative exam. 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 Follow up in 6 weeks. Colonoscopy due 2019.      HPI, Physical Exam, Assessment and Plan have been scribed under the direction and in the presence of Mckinley Jewel, MD Karie Fetch, RN I have completed the exam and reviewed  the above documentation for accuracy and completeness.  I agree with the above.  Haematologist has been used and any errors in dictation or transcription are unintentional.  Bach Rocchi G. Jamal Collin, M.D., F.A.C.S.   Junie Panning G 07/29/2017, 1:49 PM

## 2017-08-07 ENCOUNTER — Other Ambulatory Visit: Payer: Self-pay | Admitting: Unknown Physician Specialty

## 2017-08-07 DIAGNOSIS — R221 Localized swelling, mass and lump, neck: Secondary | ICD-10-CM

## 2017-08-12 ENCOUNTER — Ambulatory Visit
Admission: RE | Admit: 2017-08-12 | Discharge: 2017-08-12 | Disposition: A | Discharge status: Home / Self Care | Payer: Federal, State, Local not specified - PPO | Source: Ambulatory Visit | Attending: Unknown Physician Specialty | Admitting: Unknown Physician Specialty

## 2017-08-12 DIAGNOSIS — R221 Localized swelling, mass and lump, neck: Secondary | ICD-10-CM | POA: Insufficient documentation

## 2017-08-12 LAB — POCT I-STAT CREATININE: Creatinine, Ser: 1.2 mg/dL (ref 0.61–1.24)

## 2017-08-12 MED ORDER — IOPAMIDOL (ISOVUE-300) INJECTION 61%
75.0000 mL | Freq: Once | INTRAVENOUS | Status: AC | PRN
  Administered 2017-08-12: 75 mL via INTRAVENOUS

## 2017-08-15 ENCOUNTER — Other Ambulatory Visit: Payer: Self-pay | Admitting: Unknown Physician Specialty

## 2017-08-15 DIAGNOSIS — R221 Localized swelling, mass and lump, neck: Secondary | ICD-10-CM

## 2017-08-20 ENCOUNTER — Other Ambulatory Visit: Payer: Self-pay | Admitting: Radiology

## 2017-08-21 ENCOUNTER — Ambulatory Visit
Admission: RE | Admit: 2017-08-21 | Discharge: 2017-08-21 | Disposition: A | Discharge status: Home / Self Care | Payer: Federal, State, Local not specified - PPO | Source: Ambulatory Visit | Attending: Unknown Physician Specialty | Admitting: Unknown Physician Specialty

## 2017-08-21 DIAGNOSIS — R221 Localized swelling, mass and lump, neck: Secondary | ICD-10-CM

## 2017-08-21 DIAGNOSIS — C760 Malignant neoplasm of head, face and neck: Secondary | ICD-10-CM | POA: Diagnosis not present

## 2017-08-21 DIAGNOSIS — C801 Malignant (primary) neoplasm, unspecified: Secondary | ICD-10-CM

## 2017-08-21 HISTORY — DX: Myoneural disorder, unspecified: G70.9

## 2017-08-21 HISTORY — DX: Malignant (primary) neoplasm, unspecified: C80.1

## 2017-08-21 LAB — CBC
HCT: 40.7 % (ref 40.0–52.0)
Hemoglobin: 13.9 g/dL (ref 13.0–18.0)
MCH: 28.9 pg (ref 26.0–34.0)
MCHC: 34.2 g/dL (ref 32.0–36.0)
MCV: 84.4 fL (ref 80.0–100.0)
Platelets: 195 10*3/uL (ref 150–440)
RBC: 4.83 MIL/uL (ref 4.40–5.90)
RDW: 13.5 % (ref 11.5–14.5)
WBC: 5.8 10*3/uL (ref 3.8–10.6)

## 2017-08-21 LAB — PROTIME-INR
INR: 0.9
Prothrombin Time: 12.1 seconds (ref 11.4–15.2)

## 2017-08-21 LAB — GLUCOSE, CAPILLARY: Glucose-Capillary: 155 mg/dL — ABNORMAL HIGH (ref 65–99)

## 2017-08-21 LAB — APTT: aPTT: 27 seconds (ref 24–36)

## 2017-08-21 MED ORDER — FENTANYL CITRATE (PF) 100 MCG/2ML IJ SOLN
INTRAMUSCULAR | Status: AC
  Filled 2017-08-21: qty 4

## 2017-08-21 MED ORDER — FENTANYL CITRATE (PF) 100 MCG/2ML IJ SOLN
INTRAMUSCULAR | Status: AC | PRN
  Administered 2017-08-21: 50 ug via INTRAVENOUS

## 2017-08-21 MED ORDER — MIDAZOLAM HCL 5 MG/5ML IJ SOLN
INTRAMUSCULAR | Status: AC
  Filled 2017-08-21: qty 5

## 2017-08-21 MED ORDER — SODIUM CHLORIDE 0.9 % IV SOLN
INTRAVENOUS | Status: DC
  Administered 2017-08-21: 11:00:00 via INTRAVENOUS

## 2017-08-21 MED ORDER — MIDAZOLAM HCL 5 MG/5ML IJ SOLN
INTRAMUSCULAR | Status: AC | PRN
  Administered 2017-08-21: 1 mg via INTRAVENOUS

## 2017-08-21 NOTE — Progress Notes (Signed)
fsbs [id number]

## 2017-08-21 NOTE — Procedures (Signed)
Interventional Radiology Procedure Note  Procedure: US guided core biopsy of right cervical lymph nodes  Complications: None  Estimated Blood Loss: < 10 mL  18 G core biopsy samples obtained of adjacent 1.8 and 2.8 cm right cervical lymph nodes.  Venetia Night. Kathlene Cote, M.D Pager:  [phone]

## 2017-08-21 NOTE — H&P (Signed)
Chief Complaint: Patient was seen in consultation today for No chief complaint on file.  at the request of [place]  Referring Physician(s): McQueen,Chapman  Patient Status: [facility] - Out-pt  History of Present Illness: Stephen Yu is a 61 y.o. male with palpable right neck lymphadenopathy and abnormal CT demonstrating abnormal level II right cervical lymph nodes and soft tissue fullness in the right tonsillar region.  Largest node measures 1.8 x 2 cm in right neck by CT.  Now presents for US guided biopsy.  Past Medical History:  Diagnosis Date  . Anxiety   . Arthritis   . Chronic pain 1996   due to work injury  . Colon polyp   . Cough    THAT WONT GO AWAY-PT TO SEE DR Doy Hutching ON 06-10-17 @ 4 PM  . Depression   . Diabetes mellitus without complication ([facility])   . Dysrhythmia    AFIB X 1  . GERD (gastroesophageal reflux disease)   . Herniated lumbar intervertebral disc   . Hypertension   . Hypothyroidism   . Neck pain   . Neuromuscular disorder ([place])   . Presence of intrathecal pump    Dr. Mechele Dawley in [place], [place]    Past Surgical History:  Procedure Laterality Date  . COLONOSCOPY  2013   [place]  . COLONOSCOPY WITH PROPOFOL N/A 04/22/2017   Procedure: COLONOSCOPY WITH PROPOFOL;  Surgeon: Lollie Sails, MD;  Location: [facility] ENDOSCOPY;  Service: Endoscopy;  Laterality: N/A;  . IMAGE GUIDED SINUS SURGERY    . implanted morphine pump  2016  . LAPAROSCOPIC CHOLECYSTECTOMY  2013  . PARTIAL COLECTOMY Right 06/17/2017   Procedure: PARTIAL COLECTOMY;  Surgeon: Christene Lye, MD;  Location: [facility] ORS;  Service: General;  Laterality: Right;  . UMBILICAL HERNIA REPAIR  2013  . UMBILICAL HERNIA REPAIR  06/17/2017   Procedure: HERNIA REPAIR UMBILICAL ADULT;  Surgeon: Christene Lye, MD;  Location: [facility] ORS;  Service: General;;  . VASECTOMY      Allergies: Biaxin [clarithromycin]; Ceftin [cefuroxime axetil]; and  Penicillins  Medications: Prior to Admission medications   Medication Sig Start Date End Date Taking? Authorizing Provider  amitriptyline (ELAVIL) 100 MG tablet Take 50 mg by mouth at bedtime as needed for sleep.    Yes [provider]  Cholecalciferol (VITAMIN D3) 5000 units TABS Take 5,000 Units by mouth daily.    Yes [provider]  citalopram (CELEXA) 40 MG tablet Take 40 mg by mouth every morning.    Yes [provider]  esomeprazole (NEXIUM) 40 MG capsule Take 40 mg by mouth daily as needed.    Yes [provider]  glyBURIDE-metformin (GLUCOVANCE) 5-500 MG tablet Take 0.5 tablets by mouth See admin instructions. Takes 0.5 tablet twice daily in morning and bedtime and takes a 3rd dose only if needed for elevated blood sugar   Yes [provider]  HYDROcodone-acetaminophen (NORCO) 7.5-325 MG tablet Take 1 tablet by mouth every 6 (six) hours as needed for moderate pain.    Yes [provider]  Insulin Glargine (BASAGLAR KWIKPEN) 100 UNIT/ML SOPN Inject 40 Units into the skin daily before breakfast.    Yes [provider]  KRILL OIL PO Take 1 capsule by mouth daily.    Yes [provider]  lisinopril-hydrochlorothiazide (PRINZIDE,ZESTORETIC) 20-25 MG tablet Take 0.5 tablets by mouth daily.    Yes [provider]  MORPHINE SULFATE IJ Inject as directed. Morphine 4mg /ml epidural pump. 1.4897 mg/day  Yes [provider]  niacin 500 MG tablet Take 500 mg by mouth at bedtime.   Yes [provider]  oxymetazoline (AFRIN) 0.05 % nasal spray Place 1 spray into both nostrils 2 (two) times daily as needed for congestion.   Yes [provider]  pregabalin (LYRICA) 150 MG capsule Take 150 mg by mouth 2 (two) times daily.   Yes [provider]  thyroid (ARMOUR) 32.5 MG tablet Take 32.5 mg by mouth every morning.    Yes [provider]  tiZANidine (ZANAFLEX) 4 MG tablet Take 4 mg by  mouth every 8 (eight) hours as needed for muscle spasms.   Yes [provider]  vitamin E (VITAMIN E) 400 UNIT capsule Take 400 Units by mouth daily.   Yes [provider]  zinc gluconate 50 MG tablet Take 50 mg by mouth daily.   Yes [provider]  metroNIDAZOLE (FLAGYL) 500 MG tablet Take 1 tablet (500 mg total) by mouth See admin instructions. Take 1 tablet at 6 pm and take 1 tablet at 11 pm 05/13/17   Christene Lye, MD  neomycin (MYCIFRADIN) 500 MG tablet Take 1 tablet (500 mg total) by mouth See admin instructions. Take 2 tablets at 6 pm, then take 2 tablets at 11 pm. 05/13/17   Christene Lye, MD  sodium chloride (OCEAN) 0.65 % SOLN nasal spray Place 1 spray into both nostrils 4 (four) times daily as needed for congestion.     [provider]     Family History  Problem Relation Age of Onset  . Breast cancer Cousin 70  . Cancer - Colon Neg Hx   . Prostate cancer Neg Hx     Social History   Social History  . Marital status: Married    Spouse name: N/A  . Number of children: N/A  . Years of education: N/A   Social History Main Topics  . Smoking status: Former Smoker    Years: 20.00    Types: Cigarettes    Quit date: 12/03/1999  . Smokeless tobacco: Former Systems developer  . Alcohol use No  . Drug use: No  . Sexual activity: Not Asked   Other Topics Concern  . None   Social History Narrative  . None    ECOG Status: 0 - Asymptomatic  Review of Systems: A 12 point ROS discussed and pertinent positives are indicated in the HPI above.  All other systems are negative.  Review of Systems  Constitutional: Negative.   HENT:       Palpable right neck lymph nodes.  Eyes: Negative.   Respiratory: Negative.   Cardiovascular: Negative.   Gastrointestinal: Negative.   Genitourinary: Negative.   Musculoskeletal: Positive for back pain.       Chronic low back pain related to lumbar degenerative disc disease  Neurological: Negative.      Vital Signs: BP (!) 139/97   Pulse 67   Temp 98.5 F (36.9 C) (Oral)   Resp (!) 77   Ht 5' 9.5" (1.765 m)   Wt 230 lb (104.3 kg)   SpO2 96%   BMI 33.48 kg/m   Physical Exam  Constitutional: He is oriented to person, place, and time. He appears well-developed and well-nourished. No distress.  HENT:  Head: Normocephalic and atraumatic.  Neck: Neck supple. No JVD present.  Palpable right cervical lymph nodes  Cardiovascular: Normal rate, regular rhythm and normal heart sounds.  Exam reveals no gallop and no friction rub.  No murmur heard. Pulmonary/Chest: Effort normal and breath sounds normal. No respiratory distress. He has no wheezes. He has no rales.  Abdominal: Soft. Bowel sounds are normal. He exhibits no distension and no mass. There is no tenderness. There is no guarding.  Musculoskeletal: He exhibits no edema.  Lymphadenopathy:    He has cervical adenopathy.  Neurological: He is alert and oriented to person, place, and time.  Skin: He is not diaphoretic.  Vitals reviewed.   Imaging: Ct Soft Tissue Neck W Contrast  Result Date: 08/12/2017 CLINICAL DATA:  61 year old male with palpable abnormality along the right neck first discovered in 2016, reportedly waxes and wanes, and is non painful. EXAM: CT NECK WITH CONTRAST TECHNIQUE: Multidetector CT imaging of the neck was performed using the standard protocol following the bolus administration of intravenous contrast. CONTRAST:  84mL ISOVUE-300 IOPAMIDOL (ISOVUE-300) INJECTION 61% COMPARISON:  Outside [facility] cervical spine MRI 02/19/2017 available on [facility] PACS FINDINGS: Pharynx and larynx: Larynx soft tissue contours are normal. Subtle asymmetric soft tissue fullness in the oropharynx along the right lateral wall, right palatine tonsil region. The margins are indistinct, but this area measures up to 2.7 cm AP by transverse. The craniocaudal dimension is 1 certain but might be as large as 42 mm as seen on sagittal  image 40. The other pharyngeal soft tissue contours are within normal limits. Negative parapharyngeal and retropharyngeal spaces. Salivary glands: Negative sublingual space. Submandibular glands and parotid glands are within normal limits. Thyroid: Negative. Lymph nodes: Heterogeneous, malignant appearing right level 2 lymph node enlargement in the right level IIa station. The skin marker along the right lateral neck on series 2, image 42 is at the level of a more cephalad 15 mm short axis (by up to 19 mm long axis) asymmetrically enlarged in nodes seen on series 2, image 44. Caudal to this there is a larger more indistinct 18 mm short axis (by 24 mm long axis) heterogeneous node situated along the anteromedial border of the right sternocleidomastoid muscle (Series 2, image 54 and sagittal image 31). There are smaller but asymmetrically increased right level 2 B nodes measuring up to 8 mm (series 2, image 43). Bilateral level 1, level 3 and level 4 lymph nodes appear more symmetric and within normal limits. No level 5 lymphadenopathy. Vascular: Major vascular structures in the neck and at the skullbase are patent. Left greater than right carotid bifurcation calcified atherosclerosis. ICA siphon calcified plaque. Limited intracranial: Negative. Visualized orbits: Negative. Mastoids and visualized paranasal sinuses: Bubbly opacity in the right sphenoid sinus. Other Visualized paranasal sinuses and mastoids are stable and well pneumatized. Skeleton: Cervical spine degeneration. No acute or suspicious osseous lesion identified. Upper chest: Negative visualized lung parenchyma. Visualized mediastinum is within normal limits. No axillary lymphadenopathy. IMPRESSION: 1. Palpable area of concern corresponds to malignant appearing right level 2 lymph nodes, individually up to 24 mm long axis. 2. Rounded right oropharyngeal mass at the level of the right palatine tonsil has indistinct margins and is estimated at 2.7 x 4.2 cm.  3. Constellation compatible with squamous cell carcinoma, imaging stage T3 N2b. Electronically Signed   By: Genevie Ann M.D.   On: 08/12/2017 16:00    Labs:  CBC:  Recent Labs  06/17/17 1514 06/18/17 0427 06/19/17 0415 08/21/17 0956  WBC 17.1* 13.6* 11.6* 5.8  HGB 13.9 12.9* 10.3* 13.9  HCT 40.6 37.6* 30.1* 40.7  PLT 233 222 171 195    COAGS:  Recent Labs  08/21/17 0956  INR  0.90  APTT 27    BMP:  Recent Labs  06/17/17 0639 06/17/17 1514 06/18/17 0427 08/12/17 1417  NA 136  --  136  --   K 4.1  --  3.8  --   CL  --   --  103  --   CO2  --   --  27  --   GLUCOSE 156*  --  159*  --   BUN  --   --  15  --   CALCIUM  --   --  8.3*  --   CREATININE  --  1.26* 1.01 1.20  GFRNONAA  --  59* >60  --   GFRAA  --  >60 >60  --     Assessment and Plan:  For US guided right cervical core lymph node biopsy today with moderate IV conscious sedation.  Risks and benefits discussed with the patient including, but not limited to bleeding, infection, damage to adjacent structures or low yield requiring additional tests. All of the patient's questions were answered, patient is agreeable to proceed. Consent signed and in chart.  Thank you for this interesting consult.  I greatly enjoyed meeting Sheena Simonis and look forward to participating in their care.  A copy of this report was sent to the requesting provider on this date.  Electronically Signed: Azzie Roup, MD 08/21/2017, 10:45 AM   I spent a total of 30 Minutes in face to face in clinical consultation, greater than 50% of which was counseling/coordinating care for right cervical lymph node biopsy.

## 2017-08-25 ENCOUNTER — Other Ambulatory Visit: Payer: Self-pay | Admitting: Pathology

## 2017-08-25 LAB — SURGICAL PATHOLOGY

## 2017-08-27 ENCOUNTER — Other Ambulatory Visit: Payer: Self-pay | Admitting: Unknown Physician Specialty

## 2017-08-27 DIAGNOSIS — IMO0002 Reserved for concepts with insufficient information to code with codable children: Secondary | ICD-10-CM

## 2017-09-02 ENCOUNTER — Ambulatory Visit
Admission: RE | Admit: 2017-09-02 | Discharge: 2017-09-02 | Disposition: A | Discharge status: Home / Self Care | Payer: Federal, State, Local not specified - PPO | Source: Ambulatory Visit | Attending: Unknown Physician Specialty | Admitting: Unknown Physician Specialty

## 2017-09-04 ENCOUNTER — Inpatient Hospital Stay: Payer: Federal, State, Local not specified - PPO | Admitting: Oncology

## 2017-09-05 ENCOUNTER — Other Ambulatory Visit: Payer: Federal, State, Local not specified - PPO

## 2017-09-09 ENCOUNTER — Inpatient Hospital Stay: Payer: Federal, State, Local not specified - PPO | Admitting: Oncology

## 2017-09-10 ENCOUNTER — Encounter
Admission: RE | Admit: 2017-09-10 | Discharge: 2017-09-10 | Disposition: A | Discharge status: Home / Self Care | Payer: Federal, State, Local not specified - PPO | Source: Ambulatory Visit | Attending: Unknown Physician Specialty | Admitting: Unknown Physician Specialty

## 2017-09-10 ENCOUNTER — Encounter: Payer: Self-pay | Admitting: General Surgery

## 2017-09-10 ENCOUNTER — Ambulatory Visit (INDEPENDENT_AMBULATORY_CARE_PROVIDER_SITE_OTHER): Payer: Federal, State, Local not specified - PPO | Admitting: General Surgery

## 2017-09-10 VITALS — BP 108/60 | HR 72 | Resp 13 | Ht 71.0 in | Wt 240.0 lb

## 2017-09-10 DIAGNOSIS — IMO0002 Reserved for concepts with insufficient information to code with codable children: Secondary | ICD-10-CM

## 2017-09-10 DIAGNOSIS — C4492 Squamous cell carcinoma of skin, unspecified: Secondary | ICD-10-CM | POA: Insufficient documentation

## 2017-09-10 DIAGNOSIS — D122 Benign neoplasm of ascending colon: Secondary | ICD-10-CM

## 2017-09-10 LAB — GLUCOSE, CAPILLARY: Glucose-Capillary: 176 mg/dL — ABNORMAL HIGH (ref 65–99)

## 2017-09-10 MED ORDER — FLUDEOXYGLUCOSE F - 18 (FDG) INJECTION
12.3600 | Freq: Once | INTRAVENOUS | Status: AC | PRN
  Administered 2017-09-10: 12.36 via INTRAVENOUS

## 2017-09-10 NOTE — Progress Notes (Signed)
Patient ID: Stephen Yu, male   DOB: 10/19/1956, 61 y.o.   MRN: [id number]  Chief Complaint  Patient presents with  . Routine Post Op    HPI Stephen Yu is a 61 y.o. male here today for his follow up right colectomy and umbilical hernia repair done on 06/17/2017. He reports that he continues to improve. He reports no pain and is using the bathroom with out any difficulty. Most bowel movements are formed and soft.  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 He is seeing Dr Janese Banks from oncology for this.  HPI  Past Medical History:  Diagnosis Date  . Anxiety   . Arthritis   . Cancer ([place]) 08/21/2017  . Chronic pain 1996   due to work injury  . Colon polyp    adenomatous polyp ascending colon  . Cough    THAT WONT GO AWAY-PT TO SEE DR Doy Hutching ON 06-10-17 @ 4 PM  . Depression   . Diabetes mellitus without complication ([place])   . Dysrhythmia    AFIB X 1  . GERD (gastroesophageal reflux disease)   . Herniated lumbar intervertebral disc   . Hypertension   . Hypothyroidism   . Neck pain   . Neuromuscular disorder ([place])   . Presence of intrathecal pump    Dr. Mechele Dawley in [place], [place]    Past Surgical History:  Procedure Laterality Date  . COLONOSCOPY  2013   [place]  . COLONOSCOPY WITH PROPOFOL N/A 04/22/2017   Procedure: COLONOSCOPY WITH PROPOFOL;  Surgeon: Lollie Sails, MD;  Location: [facility] ENDOSCOPY;  Service: Endoscopy;  Laterality: N/A;  . IMAGE GUIDED SINUS SURGERY    . implanted morphine pump  2016  . LAPAROSCOPIC CHOLECYSTECTOMY  2013  . PARTIAL COLECTOMY Right 06/17/2017   Procedure: PARTIAL COLECTOMY;  Surgeon: Christene Lye, MD;  Location: [facility] ORS;  Service: General;  Laterality: Right;  . UMBILICAL HERNIA REPAIR  2013  . UMBILICAL HERNIA REPAIR  06/17/2017   Procedure: HERNIA REPAIR UMBILICAL ADULT;  Surgeon: Christene Lye, MD;   Location: [facility] ORS;  Service: General;;  . VASECTOMY      Family History  Problem Relation Age of Onset  . Breast cancer Cousin 30  . Cancer - Colon Neg Hx   . Prostate cancer Neg Hx     Social History Social History  Substance Use Topics  . Smoking status: Former Smoker    Years: 20.00    Types: Cigarettes    Quit date: 12/03/1999  . Smokeless tobacco: Former Systems developer  . Alcohol use No    Allergies  Allergen Reactions  . Biaxin [Clarithromycin] Other (See Comments)    Severe hiccups  . Ceftin [Cefuroxime Axetil] Other (See Comments)    Severe hiccups  . Penicillins Hives    Has patient had a PCN reaction causing immediate rash, facial/tongue/throat swelling, SOB or lightheadedness with hypotension: No Has patient had a PCN reaction causing severe rash involving mucus membranes or skin necrosis: unknown Has patient had a PCN reaction that required hospitalization: No Has patient had a PCN reaction occurring within the last 10 years: No If all of the above answers are "NO", then may proceed with Cephalosporin use.     Current Outpatient Prescriptions  Medication Sig Dispense Refill  . amitriptyline (ELAVIL) 100 MG tablet Take 50 mg by mouth at bedtime as needed for sleep.     Marland Kitchen  Cholecalciferol (VITAMIN D3) 5000 units TABS Take 5,000 Units by mouth daily.     . citalopram (CELEXA) 40 MG tablet Take 40 mg by mouth every morning.     Marland Kitchen esomeprazole (NEXIUM) 40 MG capsule Take 40 mg by mouth daily as needed.     . glyBURIDE-metformin (GLUCOVANCE) 5-500 MG tablet Take 0.5 tablets by mouth See admin instructions. Takes 0.5 tablet twice daily in morning and bedtime and takes a 3rd dose only if needed for elevated blood sugar    . HYDROcodone-acetaminophen (NORCO) 7.5-325 MG tablet Take 1 tablet by mouth every 6 (six) hours as needed for moderate pain.     . Insulin Glargine (BASAGLAR KWIKPEN) 100 UNIT/ML SOPN Inject 40 Units into the skin daily before breakfast.     . KRILL OIL PO  Take 1 capsule by mouth daily.     Marland Kitchen lisinopril-hydrochlorothiazide (PRINZIDE,ZESTORETIC) 20-25 MG tablet Take 0.5 tablets by mouth daily.     . MORPHINE SULFATE IJ Inject as directed. Morphine 4mg /ml epidural pump. 1.4897 mg/day    . niacin 500 MG tablet Take 500 mg by mouth at bedtime.    Marland Kitchen oxymetazoline (AFRIN) 0.05 % nasal spray Place 1 spray into both nostrils 2 (two) times daily as needed for congestion.    . pregabalin (LYRICA) 150 MG capsule Take 150 mg by mouth 2 (two) times daily.    Marland Kitchen thyroid (ARMOUR) 32.5 MG tablet Take 32.5 mg by mouth every morning.     Marland Kitchen tiZANidine (ZANAFLEX) 4 MG tablet Take 4 mg by mouth every 8 (eight) hours as needed for muscle spasms.    . vitamin E (VITAMIN E) 400 UNIT capsule Take 400 Units by mouth daily.    Marland Kitchen zinc gluconate 50 MG tablet Take 50 mg by mouth daily.     No current facility-administered medications for this visit.     Review of Systems Review of Systems  Constitutional: Negative.   HENT: Negative.   Respiratory: Negative.   Cardiovascular: Negative.   Gastrointestinal: Negative.   Genitourinary: Negative.     Blood pressure 108/60, pulse 72, resp. rate 13, height 5\' 11"  (1.803 m), weight 240 lb (108.9 kg).  Physical Exam Physical Exam  Constitutional: He appears well-developed and well-nourished.  Eyes: Conjunctivae are normal. No scleral icterus.  Neck: Neck supple. No thyromegaly present.  Cardiovascular: Normal rate, regular rhythm and normal heart sounds.   Pulmonary/Chest: Effort normal and breath sounds normal.  Abdominal: Soft. There is no hepatomegaly. There is no tenderness. No hernia.    Lymphadenopathy:    He has cervical adenopathy.    Data Reviewed Prior notes and pathology reports  Assessment    Tubular adenoma ascending colon with focal atypia, s/p right colectomy. Pt is doing well  From that surgery.     Plan    Patient will need a Colonoscopy in May of 2019.     HPI, Physical Exam, Assessment  and Plan have been scribed under the direction and in the presence of Mckinley Jewel, MD  Concepcion Living, LPN I have completed the exam and reviewed the above documentation for accuracy and completeness.  I agree with the above.  Haematologist has been used and any errors in dictation or transcription are unintentional.  Seeplaputhur G. Jamal Collin, M.D., F.A.C.S.  CC: Dr Donnella Sham CC: Dr Riley Nearing 09/10/2017, 3:04 PM

## 2017-09-12 NOTE — Progress Notes (Signed)
Thank you :)

## 2017-09-16 ENCOUNTER — Encounter: Payer: Self-pay | Admitting: Oncology

## 2017-09-16 ENCOUNTER — Ambulatory Visit
Admission: RE | Admit: 2017-09-16 | Discharge: 2017-09-16 | Disposition: A | Discharge status: Home / Self Care | Payer: Federal, State, Local not specified - PPO | Source: Ambulatory Visit | Attending: Radiation Oncology | Admitting: Radiation Oncology

## 2017-09-16 ENCOUNTER — Encounter (INDEPENDENT_AMBULATORY_CARE_PROVIDER_SITE_OTHER): Payer: Self-pay

## 2017-09-16 ENCOUNTER — Inpatient Hospital Stay: Payer: Federal, State, Local not specified - PPO | Attending: Oncology | Admitting: Oncology

## 2017-09-16 ENCOUNTER — Other Ambulatory Visit: Payer: Self-pay | Admitting: *Deleted

## 2017-09-16 VITALS — BP 126/83 | HR 77 | Temp 98.1°F | Resp 14 | Ht 69.5 in | Wt 236.0 lb

## 2017-09-16 DIAGNOSIS — K760 Fatty (change of) liver, not elsewhere classified: Secondary | ICD-10-CM | POA: Insufficient documentation

## 2017-09-16 DIAGNOSIS — I10 Essential (primary) hypertension: Secondary | ICD-10-CM | POA: Insufficient documentation

## 2017-09-16 DIAGNOSIS — K219 Gastro-esophageal reflux disease without esophagitis: Secondary | ICD-10-CM | POA: Insufficient documentation

## 2017-09-16 DIAGNOSIS — N281 Cyst of kidney, acquired: Secondary | ICD-10-CM | POA: Diagnosis not present

## 2017-09-16 DIAGNOSIS — Z79899 Other long term (current) drug therapy: Secondary | ICD-10-CM | POA: Insufficient documentation

## 2017-09-16 DIAGNOSIS — Z87891 Personal history of nicotine dependence: Secondary | ICD-10-CM | POA: Insufficient documentation

## 2017-09-16 DIAGNOSIS — N62 Hypertrophy of breast: Secondary | ICD-10-CM | POA: Diagnosis not present

## 2017-09-16 DIAGNOSIS — E039 Hypothyroidism, unspecified: Secondary | ICD-10-CM | POA: Insufficient documentation

## 2017-09-16 DIAGNOSIS — I700 Atherosclerosis of aorta: Secondary | ICD-10-CM | POA: Insufficient documentation

## 2017-09-16 DIAGNOSIS — F329 Major depressive disorder, single episode, unspecified: Secondary | ICD-10-CM | POA: Insufficient documentation

## 2017-09-16 DIAGNOSIS — Z803 Family history of malignant neoplasm of breast: Secondary | ICD-10-CM | POA: Insufficient documentation

## 2017-09-16 DIAGNOSIS — E119 Type 2 diabetes mellitus without complications: Secondary | ICD-10-CM | POA: Insufficient documentation

## 2017-09-16 DIAGNOSIS — F419 Anxiety disorder, unspecified: Secondary | ICD-10-CM | POA: Insufficient documentation

## 2017-09-16 DIAGNOSIS — I4891 Unspecified atrial fibrillation: Secondary | ICD-10-CM | POA: Diagnosis not present

## 2017-09-16 DIAGNOSIS — I2510 Atherosclerotic heart disease of native coronary artery without angina pectoris: Secondary | ICD-10-CM | POA: Diagnosis not present

## 2017-09-16 DIAGNOSIS — M129 Arthropathy, unspecified: Secondary | ICD-10-CM | POA: Insufficient documentation

## 2017-09-16 DIAGNOSIS — C109 Malignant neoplasm of oropharynx, unspecified: Secondary | ICD-10-CM

## 2017-09-16 DIAGNOSIS — C099 Malignant neoplasm of tonsil, unspecified: Secondary | ICD-10-CM

## 2017-09-16 DIAGNOSIS — Z86010 Personal history of colonic polyps: Secondary | ICD-10-CM | POA: Insufficient documentation

## 2017-09-16 DIAGNOSIS — C090 Malignant neoplasm of tonsillar fossa: Secondary | ICD-10-CM | POA: Insufficient documentation

## 2017-09-16 DIAGNOSIS — G8929 Other chronic pain: Secondary | ICD-10-CM | POA: Diagnosis not present

## 2017-09-16 DIAGNOSIS — M542 Cervicalgia: Secondary | ICD-10-CM | POA: Diagnosis not present

## 2017-09-16 DIAGNOSIS — Z9049 Acquired absence of other specified parts of digestive tract: Secondary | ICD-10-CM | POA: Insufficient documentation

## 2017-09-16 DIAGNOSIS — Z510 Encounter for antineoplastic radiation therapy: Secondary | ICD-10-CM | POA: Insufficient documentation

## 2017-09-16 DIAGNOSIS — Z5111 Encounter for antineoplastic chemotherapy: Secondary | ICD-10-CM | POA: Insufficient documentation

## 2017-09-16 DIAGNOSIS — Z794 Long term (current) use of insulin: Secondary | ICD-10-CM | POA: Insufficient documentation

## 2017-09-16 DIAGNOSIS — G709 Myoneural disorder, unspecified: Secondary | ICD-10-CM | POA: Diagnosis not present

## 2017-09-16 DIAGNOSIS — M549 Dorsalgia, unspecified: Secondary | ICD-10-CM | POA: Insufficient documentation

## 2017-09-16 DIAGNOSIS — R05 Cough: Secondary | ICD-10-CM | POA: Diagnosis not present

## 2017-09-16 DIAGNOSIS — C799 Secondary malignant neoplasm of unspecified site: Secondary | ICD-10-CM

## 2017-09-16 DIAGNOSIS — Z7189 Other specified counseling: Secondary | ICD-10-CM

## 2017-09-16 DIAGNOSIS — IMO0002 Reserved for concepts with insufficient information to code with codable children: Secondary | ICD-10-CM

## 2017-09-16 NOTE — Consult Note (Signed)
NEW PATIENT EVALUATION  Name: Stephen Yu  MRN: [id number]  Date:   09/16/2017     DOB: [birth year]   This 61 y.o. 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   DIAGNOSIS: There were no encounter diagnoses.   PREVIOUS INVESTIGATIONS:  PET CT scan reviewed Pathology reports reviewed Clinical notes reviewed  HPI: Patient is a 61 year old male status post recent right colectomy umbilical hernia repair which he tolerated well. Patient colectomy for adenomatous polyp of the proximal transverse colon. He was also noticed to have chronic persistent sore throat was noted to have enlargement of the right tonsillar fossa. He also was noted to have enlarged right cervical lymph nodes. He underwent a CT-guided biopsy of his right neck positive for squamous cell carcinoma P 16 positive. PET CT demonstrated hypermetabolic activity compatible with malignancy in the right palatine region with also several right level IIA lymph nodes. No other evidence of metastatic spread was noted. Patient does have a narcotic left lower lobe quadrant pump device for chronic back pain. He states he does have some slight dysphasia and some tenderness in his right neck. He is now referred to radiation oncology and medical oncology for evaluation and possible treatment options.  PLANNED TREATMENT REGIMEN: Concurrent chemoradiation with curative intent  PAST MEDICAL HISTORY:  has a past medical history of Anxiety; Arthritis; Cancer ([place]) (08/21/2017); Chronic pain (1996); Colon polyp; Cough; Depression; Diabetes mellitus without complication ([place]); Dysrhythmia; GERD (gastroesophageal reflux disease); Herniated lumbar intervertebral disc; Hypertension; Hypothyroidism; Neck pain; Neuromuscular disorder ([place]); and Presence of intrathecal  pump.    PAST SURGICAL HISTORY:  Past Surgical History:  Procedure Laterality Date  . COLONOSCOPY  2013   [place]  . COLONOSCOPY WITH PROPOFOL N/A 04/22/2017   Procedure: COLONOSCOPY WITH PROPOFOL;  Surgeon: Lollie Sails, MD;  Location: [facility] ENDOSCOPY;  Service: Endoscopy;  Laterality: N/A;  . IMAGE GUIDED SINUS SURGERY    . implanted morphine pump  2016  . LAPAROSCOPIC CHOLECYSTECTOMY  2013  . PARTIAL COLECTOMY Right 06/17/2017   Procedure: PARTIAL COLECTOMY;  Surgeon: Christene Lye, MD;  Location: [facility] ORS;  Service: General;  Laterality: Right;  . UMBILICAL HERNIA REPAIR  2013  . UMBILICAL HERNIA REPAIR  06/17/2017   Procedure: HERNIA REPAIR UMBILICAL ADULT;  Surgeon: Christene Lye, MD;  Location: [facility] ORS;  Service: General;;  . VASECTOMY      FAMILY HISTORY: family history includes Breast cancer (age of onset: 41) in his cousin.  SOCIAL HISTORY:  reports that he quit smoking about 17 years ago. His smoking use included Cigarettes. He quit after 20.00 years of use. He has quit using smokeless tobacco. He reports that he does not drink alcohol or use drugs.  ALLERGIES: Biaxin [clarithromycin]; Ceftin [cefuroxime axetil]; and Penicillins  MEDICATIONS:  Current Outpatient Prescriptions  Medication Sig Dispense Refill  . amitriptyline (ELAVIL) 100 MG tablet Take 50 mg by mouth at bedtime as needed for sleep.     . Cholecalciferol (VITAMIN D3) 5000 units TABS Take 5,000 Units by mouth daily.     . citalopram (CELEXA) 40 MG tablet Take 40 mg by mouth every morning.     Marland Kitchen esomeprazole (NEXIUM) 40 MG capsule Take 40 mg by mouth daily as needed.     . glyBURIDE-metformin (GLUCOVANCE) 5-500 MG tablet Take 0.5 tablets by mouth See  admin instructions. Takes 0.5 tablet twice daily in morning and bedtime and takes a 3rd dose only if needed for elevated blood sugar    . HYDROcodone-acetaminophen (NORCO) 7.5-325 MG tablet Take 1 tablet by mouth every 6 (six) hours as needed  for moderate pain.     . Insulin Glargine (BASAGLAR KWIKPEN) 100 UNIT/ML SOPN Inject 40 Units into the skin daily before breakfast.     . KRILL OIL PO Take 1 capsule by mouth daily.     Marland Kitchen lisinopril-hydrochlorothiazide (PRINZIDE,ZESTORETIC) 20-25 MG tablet Take 1 tablet by mouth daily.     . MORPHINE SULFATE IJ Inject as directed. Morphine 4mg /ml epidural pump. 1.4897 mg/day    . niacin 500 MG tablet Take 500 mg by mouth at bedtime.    Marland Kitchen oxymetazoline (AFRIN) 0.05 % nasal spray Place 1 spray into both nostrils 2 (two) times daily as needed for congestion.    . pregabalin (LYRICA) 150 MG capsule Take 150 mg by mouth 2 (two) times daily.    Marland Kitchen thyroid (ARMOUR) 32.5 MG tablet Take 32.5 mg by mouth every morning.     Marland Kitchen tiZANidine (ZANAFLEX) 4 MG tablet Take 4 mg by mouth every 8 (eight) hours as needed for muscle spasms.    . vitamin E (VITAMIN E) 400 UNIT capsule Take 400 Units by mouth daily.    Marland Kitchen zinc gluconate 50 MG tablet Take 50 mg by mouth daily.     No current facility-administered medications for this encounter.     ECOG PERFORMANCE STATUS:  1 - Symptomatic but completely ambulatory  REVIEW OF SYSTEMS: Except for the minor dysphasia and and throat pain and history of chronic back pain  Patient denies any weight loss, fatigue, weakness, fever, chills or night sweats. Patient denies any loss of vision, blurred vision. Patient denies any ringing  of the ears or hearing loss. No irregular heartbeat. Patient denies heart murmur or history of fainting. Patient denies any chest pain or pain radiating to her upper extremities. Patient denies any shortness of breath, difficulty breathing at night, cough or hemoptysis. Patient denies any swelling in the lower legs. Patient denies any nausea vomiting, vomiting of blood, or coffee ground material in the vomitus. Patient denies any stomach pain. Patient states has had normal bowel movements no significant constipation or diarrhea. Patient denies any  dysuria, hematuria or significant nocturia. Patient denies any problems walking, swelling in the joints or loss of balance. Patient denies any skin changes, loss of hair or loss of weight. Patient denies any excessive worrying or anxiety or significant depression. Patient denies any problems with insomnia. Patient denies excessive thirst, polyuria, polydipsia. Patient denies any swollen glands, patient denies easy bruising or easy bleeding. Patient denies any recent infections, allergies or URI. Patient "s visual fields have not changed significantly in recent time.    PHYSICAL EXAM: There were no vitals taken for this visit. Oral cavity is clear teeth are in good state of repair he does have marked enlargement of the right tonsillar fossa. Indirect mirror examination shows upper airway clear vallecula and base of tongue within normal limits. He does have prominent adenopathy in the right sub-guided sub-digastric cervical chain. No supraclavicular adenopathy is appreciated. Well-developed well-nourished patient in NAD. HEENT reveals PERLA, EOMI, discs not visualized.  Oral cavity is clear. No oral mucosal lesions are identified. Neck is clear without evidence of cervical or supraclavicular adenopathy. Lungs are clear to A&P. Cardiac examination is essentially unremarkable with regular rate and rhythm without murmur rub  or thrill. Abdomen is benign with no organomegaly or masses noted. Motor sensory and DTR levels are equal and symmetric in the upper and lower extremities. Cranial nerves II through XII are grossly intact. Proprioception is intact. No peripheral adenopathy or edema is identified. No motor or sensory levels are noted. Crude visual fields are within normal range.  LABORATORY DATA: Pathology reports reviewed    RADIOLOGY RESULTS: PET CT scan reviewed   IMPRESSION: Locally advanced squamous cell carcinoma the right Palatino fossa in 60 year old male. Clinical stage III (T3 N1 M0)  PLAN: At  this time I would not recommend concurrent chemoradiation. I would plan on delivering 7000 cGy over 7 weeks to the areas of hypermetabolic activity including the right Palatino fossa as well as positive cervical nodes. Would dose painting remainder of his neck nodes to 5400 cGy using I am RT dose painting technique. I would choose IM RT to spare critical structures such as a salivary glands oral cavity spinal cord. Risks and benefits of treatment including increased dysphasia oral mucositis skin reaction fatigue alteration of blood counts alteration of taste all were discussed in detail with the patient and his wife. I personally ordered and scheduled CT simulation for later this week.There will be extra effort by both professional staff as well as technical staff to coordinate and manage concurrent chemoradiation and ensuing side effects during his treatments. We will coordinate his chemotherapy along with our radiation treatments. Patient wife seem to comprehend my treatment plan well.  I would like to take this opportunity to thank you for allowing me to participate in the care of your patient.Armstead Peaks., MD

## 2017-09-16 NOTE — Progress Notes (Signed)
Patient here for new diagnosis of squamous cell carcinoma of the neck. He has chronic back pain from a previous injury and has a morphine pump. He has already received the Flu Vaccine.

## 2017-09-17 ENCOUNTER — Other Ambulatory Visit: Payer: Self-pay | Admitting: Oncology

## 2017-09-17 ENCOUNTER — Ambulatory Visit
Admission: RE | Admit: 2017-09-17 | Discharge: 2017-09-17 | Disposition: A | Discharge status: Home / Self Care | Payer: Federal, State, Local not specified - PPO | Source: Ambulatory Visit | Attending: Radiation Oncology | Admitting: Radiation Oncology

## 2017-09-17 ENCOUNTER — Other Ambulatory Visit: Payer: Self-pay | Admitting: *Deleted

## 2017-09-17 DIAGNOSIS — Z87891 Personal history of nicotine dependence: Secondary | ICD-10-CM | POA: Diagnosis not present

## 2017-09-17 DIAGNOSIS — F419 Anxiety disorder, unspecified: Secondary | ICD-10-CM | POA: Diagnosis not present

## 2017-09-17 DIAGNOSIS — G8929 Other chronic pain: Secondary | ICD-10-CM | POA: Diagnosis not present

## 2017-09-17 DIAGNOSIS — Z86010 Personal history of colonic polyps: Secondary | ICD-10-CM | POA: Diagnosis not present

## 2017-09-17 DIAGNOSIS — Z79899 Other long term (current) drug therapy: Secondary | ICD-10-CM | POA: Diagnosis not present

## 2017-09-17 DIAGNOSIS — C109 Malignant neoplasm of oropharynx, unspecified: Secondary | ICD-10-CM | POA: Insufficient documentation

## 2017-09-17 DIAGNOSIS — M542 Cervicalgia: Secondary | ICD-10-CM | POA: Diagnosis not present

## 2017-09-17 DIAGNOSIS — Z510 Encounter for antineoplastic radiation therapy: Secondary | ICD-10-CM | POA: Diagnosis not present

## 2017-09-17 DIAGNOSIS — E039 Hypothyroidism, unspecified: Secondary | ICD-10-CM | POA: Diagnosis not present

## 2017-09-17 DIAGNOSIS — K219 Gastro-esophageal reflux disease without esophagitis: Secondary | ICD-10-CM | POA: Diagnosis not present

## 2017-09-17 DIAGNOSIS — I10 Essential (primary) hypertension: Secondary | ICD-10-CM | POA: Diagnosis not present

## 2017-09-17 DIAGNOSIS — M549 Dorsalgia, unspecified: Secondary | ICD-10-CM | POA: Diagnosis not present

## 2017-09-17 DIAGNOSIS — M129 Arthropathy, unspecified: Secondary | ICD-10-CM | POA: Diagnosis not present

## 2017-09-17 DIAGNOSIS — C090 Malignant neoplasm of tonsillar fossa: Secondary | ICD-10-CM | POA: Diagnosis not present

## 2017-09-17 DIAGNOSIS — Z9049 Acquired absence of other specified parts of digestive tract: Secondary | ICD-10-CM | POA: Diagnosis not present

## 2017-09-17 DIAGNOSIS — Z803 Family history of malignant neoplasm of breast: Secondary | ICD-10-CM | POA: Diagnosis not present

## 2017-09-17 DIAGNOSIS — Z7189 Other specified counseling: Secondary | ICD-10-CM | POA: Insufficient documentation

## 2017-09-17 DIAGNOSIS — F329 Major depressive disorder, single episode, unspecified: Secondary | ICD-10-CM | POA: Diagnosis not present

## 2017-09-17 DIAGNOSIS — Z794 Long term (current) use of insulin: Secondary | ICD-10-CM | POA: Diagnosis not present

## 2017-09-17 DIAGNOSIS — E119 Type 2 diabetes mellitus without complications: Secondary | ICD-10-CM | POA: Diagnosis not present

## 2017-09-17 MED ORDER — DEXAMETHASONE 4 MG PO TABS: ORAL_TABLET | ORAL | 1 refills | Status: DC

## 2017-09-17 MED ORDER — PROCHLORPERAZINE MALEATE 10 MG PO TABS: 10.0000 mg | ORAL_TABLET | Freq: Four times a day (QID) | ORAL | 1 refills | Status: DC | PRN

## 2017-09-17 MED ORDER — LIDOCAINE-PRILOCAINE 2.5-2.5 % EX CREA: TOPICAL_CREAM | CUTANEOUS | 3 refills | Status: DC

## 2017-09-17 MED ORDER — ONDANSETRON HCL 8 MG PO TABS: 8.0000 mg | ORAL_TABLET | Freq: Two times a day (BID) | ORAL | 1 refills | Status: DC | PRN

## 2017-09-17 MED ORDER — LORAZEPAM 0.5 MG PO TABS: 0.5000 mg | ORAL_TABLET | Freq: Four times a day (QID) | ORAL | 0 refills | Status: DC | PRN

## 2017-09-17 NOTE — Progress Notes (Addendum)
Hematology/Oncology Consult note [facility] Telephone:([phone] [phone]  Patient Care Team: Idelle Crouch, MD as PCP - General (Internal Medicine) Lollie Sails, MD as Consulting Physician (Gastroenterology) Christene Lye, MD (General Surgery)   Name of the patient: Stephen Yu  [id number]  1956-11-13    Reason for referral- [facility] tonsil   Referring physician- Dr. Con Memos  Date of visit: 09/17/17   History of presenting illness- 1. Patient is a 61 year old male who was found to have a right neck mass about 1 year ago when he underwent morphine pump placement. At that time he was seen by ENT and also went through antibiotic course but was not found to have a malignancy back then. Patient recently underwent a hemicolectomy by Dr. Jamal Collin as he was found to have a colonic polyp which was not resectable on colonoscopy. Shortly following the surgery patient again noticed right sided neck mass. He wasttreated with empiric antibiotics by Dr. Doy Hutching but the mass did not go away and he was referred to Dr. Tami Ribas. Patient was found to have tonsillar asymmetry on NPL exam and underwent CT neck  2. CT soft tissue of the neck. Multiple right-sided lymph nodes at level IIA and 2B between 1.5-1.8 cm in size which were suspicious in appearance.also noted to have started asymmetric soft tissue fullness in the oropharynx or on the right lateral wall in the right palatine tonsil region measuring up to 2.7 cm.  3. 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 This was followed by a PET CT scan which showed right palatine tonsillar mass with a maximum SUV of 8.5 compatible with malignancy. Several right level IIA lymph nodes are present and are hypermetabolic, larger node measuring 1.6 cm in short axis. No evidence of  metastatic spread to the chest abdomen and pelvis or visualized skeleton  5. Patient has been referred to [place] for further management. Currently patient reports some pain and discomfort in his right neck. Denies any difficulty swallowing. His appetite is good and he does not report any unintentional weight loss. He does have chronic pain for which she has a morphine pump in place   ECOG PS- 0  Pain scale- 6- back pain   Review of systems- Review of Systems  Constitutional: Negative for chills, fever, malaise/fatigue and weight loss.  HENT: Negative for congestion, ear discharge and nosebleeds.        Neck pain  Eyes: Negative for blurred vision.  Respiratory: Negative for cough, hemoptysis, sputum production, shortness of breath and wheezing.   Cardiovascular: Negative for chest pain, palpitations, orthopnea and claudication.  Gastrointestinal: Negative for abdominal pain, blood in stool, constipation, diarrhea, heartburn, melena, nausea and vomiting.  Genitourinary: Negative for dysuria, flank pain, frequency, hematuria and urgency.  Musculoskeletal: Positive for back pain. Negative for joint pain and myalgias.  Skin: Negative for rash.  Neurological: Negative for dizziness, tingling, focal weakness, seizures, weakness and headaches.  Endo/Heme/Allergies: Does not bruise/bleed easily.  Psychiatric/Behavioral: Negative for depression and suicidal ideas. The patient does not have insomnia.     Allergies  Allergen Reactions  . Biaxin [Clarithromycin] Other (See Comments)    Severe hiccups  . Ceftin [Cefuroxime Axetil] Other (See Comments)    Severe hiccups  . Penicillins Hives    Has patient had a PCN reaction causing immediate rash, facial/tongue/throat swelling, SOB or lightheadedness with hypotension: No Has  patient had a PCN reaction causing severe rash involving mucus membranes or skin necrosis: unknown Has patient had a PCN reaction that required hospitalization: No Has patient  had a PCN reaction occurring within the last 10 years: No If all of the above answers are "NO", then may proceed with Cephalosporin use.     Patient Active Problem List   Diagnosis Date Noted  . Polyp of transverse colon 06/17/2017     Past Medical History:  Diagnosis Date  . Anxiety   . Arthritis   . Cancer ([place]) 08/21/2017  . Chronic pain 1996   due to work injury  . Colon polyp    adenomatous polyp ascending colon  . Cough    THAT WONT GO AWAY-PT TO SEE DR Doy Hutching ON 06-10-17 @ 4 PM  . Depression   . Diabetes mellitus without complication ([place])   . Dysrhythmia    AFIB X 1  . GERD (gastroesophageal reflux disease)   . Herniated lumbar intervertebral disc   . Hypertension   . Hypothyroidism   . Neck pain   . Neuromuscular disorder ([place])   . Presence of intrathecal pump    Dr. Mechele Dawley in [place], [place]     Past Surgical History:  Procedure Laterality Date  . COLONOSCOPY  2013   [place]  . COLONOSCOPY WITH PROPOFOL N/A 04/22/2017   Procedure: COLONOSCOPY WITH PROPOFOL;  Surgeon: Lollie Sails, MD;  Location: [facility] ENDOSCOPY;  Service: Endoscopy;  Laterality: N/A;  . IMAGE GUIDED SINUS SURGERY    . implanted morphine pump  2016  . LAPAROSCOPIC CHOLECYSTECTOMY  2013  . PARTIAL COLECTOMY Right 06/17/2017   Procedure: PARTIAL COLECTOMY;  Surgeon: Christene Lye, MD;  Location: [facility] ORS;  Service: General;  Laterality: Right;  . UMBILICAL HERNIA REPAIR  2013  . UMBILICAL HERNIA REPAIR  06/17/2017   Procedure: HERNIA REPAIR UMBILICAL ADULT;  Surgeon: Christene Lye, MD;  Location: [facility] ORS;  Service: General;;  . VASECTOMY      Social History   Social History  . Marital status: Married    Spouse name: N/A  . Number of children: N/A  . Years of education: N/A   Occupational History  . Not on file.   Social History Main Topics  . Smoking status: Former Smoker    Years: 20.00    Types: Cigarettes    Quit date: 12/03/1999  .  Smokeless tobacco: Former Systems developer  . Alcohol use No  . Drug use: No  . Sexual activity: Yes   Other Topics Concern  . Not on file   Social History Narrative  . No narrative on file     Family History  Problem Relation Age of Onset  . Breast cancer Cousin 45  . Cancer - Colon Neg Hx   . Prostate cancer Neg Hx      Current Outpatient Prescriptions:  .  amitriptyline (ELAVIL) 100 MG tablet, Take 50 mg by mouth at bedtime as needed for sleep. , Disp: , Rfl:  .  Cholecalciferol (VITAMIN D3) 5000 units TABS, Take 5,000 Units by mouth daily. , Disp: , Rfl:  .  citalopram (CELEXA) 40 MG tablet, Take 40 mg by mouth every morning. , Disp: , Rfl:  .  esomeprazole (NEXIUM) 40 MG capsule, Take 40 mg by mouth daily as needed. , Disp: , Rfl:  .  glyBURIDE-metformin (GLUCOVANCE) 5-500 MG tablet, Take 0.5 tablets by mouth See admin instructions. Takes 0.5 tablet twice daily in morning and bedtime  and takes a 3rd dose only if needed for elevated blood sugar, Disp: , Rfl:  .  HYDROcodone-acetaminophen (NORCO) 7.5-325 MG tablet, Take 1 tablet by mouth every 6 (six) hours as needed for moderate pain. , Disp: , Rfl:  .  Insulin Glargine (BASAGLAR KWIKPEN) 100 UNIT/ML SOPN, Inject 40 Units into the skin daily before breakfast. , Disp: , Rfl:  .  lisinopril-hydrochlorothiazide (PRINZIDE,ZESTORETIC) 20-25 MG tablet, Take 1 tablet by mouth daily. , Disp: , Rfl:  .  MORPHINE SULFATE IJ, Inject as directed. Morphine 50m/ml epidural pump. 1.4897 mg/day, Disp: , Rfl:  .  niacin 500 MG tablet, Take 500 mg by mouth at bedtime., Disp: , Rfl:  .  oxymetazoline (AFRIN) 0.05 % nasal spray, Place 1 spray into both nostrils 2 (two) times daily as needed for congestion., Disp: , Rfl:  .  pregabalin (LYRICA) 150 MG capsule, Take 150 mg by mouth 2 (two) times daily., Disp: , Rfl:  .  thyroid (ARMOUR) 32.5 MG tablet, Take 32.5 mg by mouth every morning. , Disp: , Rfl:  .  tiZANidine (ZANAFLEX) 4 MG tablet, Take 4 mg by mouth  every 8 (eight) hours as needed for muscle spasms., Disp: , Rfl:  .  vitamin E (VITAMIN E) 400 UNIT capsule, Take 400 Units by mouth daily., Disp: , Rfl:  .  zinc gluconate 50 MG tablet, Take 50 mg by mouth daily., Disp: , Rfl:  .  KRILL OIL PO, Take 1 capsule by mouth daily. , Disp: , Rfl:    Physical exam:  Vitals:   09/16/17 1055 09/16/17 1101  BP:  126/83  Pulse:  77  Resp:  14  Temp:  98.1 F (36.7 C)  TempSrc:  Tympanic  Weight: 236 lb (107 kg)   Height: 5' 9.5" (1.765 m)    Physical Exam  Constitutional: He is oriented to person, place, and time and well-developed, well-nourished, and in no distress.  HENT:  Head: Normocephalic and atraumatic.  Mouth/Throat: Oropharynx is clear and moist.  Eyes: Pupils are equal, round, and reactive to light. EOM are normal.  Neck: Normal range of motion.  Cardiovascular: Normal rate, regular rhythm and normal heart sounds.   Pulmonary/Chest: Effort normal and breath sounds normal.  Abdominal: Soft. Bowel sounds are normal.  Midline well headed scar of hemicolectomy and prior scar of morphine pump placement seen  Lymphadenopathy:  Ill defined right neck mass about 2 cm in size.   Neurological: He is alert and oriented to person, place, and time.  Skin: Skin is warm and dry.       CMP Latest Ref Rng & Units 08/12/2017  Glucose 65 - 99 mg/dL -  BUN 6 - 20 mg/dL -  Creatinine 0.61 - 1.24 mg/dL 1.20  Sodium 135 - 145 mmol/L -  Potassium 3.5 - 5.1 mmol/L -  Chloride 101 - 111 mmol/L -  CO2 22 - 32 mmol/L -  Calcium 8.9 - 10.3 mg/dL -   CBC Latest Ref Rng & Units 08/21/2017  WBC 3.8 - 10.6 K/uL 5.8  Hemoglobin 13.0 - 18.0 g/dL 13.9  Hematocrit 40.0 - 52.0 % 40.7  Platelets 150 - 440 K/uL 195    No images are attached to the encounter.  Nm Pet Image Initial (pi) Skull Base To Thigh  Result Date: 09/10/2017 CLINICAL DATA:  Initial treatment strategy for squamous cell carcinoma of the right oropharynx with associated nodal  disease. EXAM: NUCLEAR MEDICINE PET SKULL BASE TO THIGH TECHNIQUE: 12.4 mCi F-18 FDG  was injected intravenously. Full-ring PET imaging was performed from the skull base to thigh after the radiotracer. CT data was obtained and used for attenuation correction and anatomic localization. FASTING BLOOD GLUCOSE:  Value: 176 mg/dl COMPARISON:  CT neck 08/12/2017 FINDINGS: NECK Right palatine tonsillar/oropharyngeal mass seen on recent neck CT has a maximum SUV of 8.5, compatible with malignancy. Right level IIa lymph nodes are present, with the larger node measuring 1.6 cm in short axis on image 40/3 with maximum SUV 4.9, compatible with malignant involvement. CHEST No hypermetabolic mediastinal or hilar nodes. No suspicious pulmonary nodules on the CT data. Coronary, aortic arch, and branch vessel atherosclerotic vascular disease. Small mediastinal lymph nodes are not pathologically enlarged by size criteria. Mild gynecomastia. ABDOMEN/PELVIS No abnormal hypermetabolic activity within the liver, pancreas, adrenal glands, or spleen. No hypermetabolic lymph nodes in the abdomen or pelvis. Diffuse hepatic steatosis. Cholecystectomy. Photopenic left kidney upper pole cyst. 1.1 cm peripancreatic lymph node on image 142/3, without associated hypermetabolic activity. Right hemicolectomy. Aortoiliac atherosclerotic vascular disease. Subcutaneous left lower quadrant pump device, discontinuous intrathecal of portion of the catheter. SKELETON No focal hypermetabolic activity to suggest skeletal metastasis. IMPRESSION: 1. Right palatine tonsillar mass maximum SUV 8.5 compatible with malignancy. Several right level IIa lymph nodes are present and are hypermetabolic, larger node measuring 1.6 cm in short axis. 2. No evidence of metastatic spread to the chest, abdomen/pelvis, or visualized skeleton. 3. The patient has a subcutaneous left lower quadrant pump device. Catheter related to this device the enters the thecal sac at  approximately the L2- 3 level and extends cephalad in the anterior midline. There appears to be a separate fragment of catheter extending caudad from the L2- 3 level down to the upper sacral level. 4. Other imaging findings of potential clinical significance: Aortic Atherosclerosis (ICD10-I70.0). Coronary atherosclerosis. Diffuse hepatic steatosis. Prior right hemicolectomy. Electronically Signed   By: Van Clines M.D.   On: 09/10/2017 09:55   [place] Core Biopsy (lymph Nodes)  Result Date: 08/21/2017 CLINICAL DATA:  Enlarged level II cervical lymph nodes when soft tissue fullness in the right tonsillar region by CT. The patient presents for ultrasound-guided biopsy of cervical lymphadenopathy. EXAM: ULTRASOUND GUIDED CORE BIOPSY OF RIGHT CERVICAL LYMPH NODES COMPARISON:  CT of the neck on 08/12/2017 MEDICATIONS: 1.0 mg IV Versed; 50 mcg IV Fentanyl Total Moderate Sedation Time: 21 minutes. The patient's level of consciousness and physiologic status were continuously monitored during the procedure by Radiology nursing. PROCEDURE: The procedure, risks, benefits, and alternatives were explained to the patient. Questions regarding the procedure were encouraged and answered. The patient understands and consents to the procedure. A time out was performed prior to initiating the procedure. Initial ultrasound was performed of the right neck to localize lymph nodes. The right neck was prepped with chlorhexidine in a sterile fashion, and a sterile drape was applied covering the operative field. A sterile gown and sterile gloves were used for the procedure. Local anesthesia was provided with 1% Lidocaine. Three separate 18 gauge core biopsy samples were obtained of a necrotic right cervical lymph node. Three additional 18 gauge core biopsy samples were obtained of an adjacent lymph node. Material was submitted in formalin and on Telfa soaked with saline. COMPLICATIONS: None. FINDINGS: The larger of the 2 enlarged  cervical lymph nodes in the right neck measures 2.8 x 1.8 x 2.6 cm and is largely necrotic in appearance. A more solid-appearing and smaller lymph node just superior to the necrotic lymph node measures 1.7 x 1.8  x 1.6 cm. Given necrotic appearance of the larger lymph node, decision was made to sample both lymph nodes. IMPRESSION: Ultrasound-guided core biopsy performed of adjacent enlarged level II right cervical lymph nodes. Electronically Signed   By: Aletta Edouard M.D.   On: 08/21/2017 16:08    Assessment and plan- Patient is a 61 y.o. 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 I have personally reviewed the PET/CT images independently as well. Based on the new AJCC 8 classification for HPV-positive head and neck cancers patient has stage I disease (T2N1). At this time I would recommend definitive concurrent chemoradiation with weekly cisplatin. Patient has already met with radiation oncology and will be starting weekly radiation for 7 weeks next week.  I will plan to give him weekly cisplatin at 40 mg/m but he may need dose adjustments based on his counts. I discussed risks and benefits of cisplatin including all but not limited to nausea, vomiting, fatigue, low blood counts, risk of infection. Risk of a reversible hearing loss, kidney injury and peripheral neuropathy associated with his platinum. Risk of mucositis with concurrent chemoradiation. Patient understands and agrees to proceed as planned. We will plan to get a port placement within the next week. He will attend chemotherapy class and also get a baseline audiology evaluation. We will hold off on prophylactic feeding tube at this time but if he were to have significant weight loss during treatment he would  consider that down the line. Chemotherapy will be given with curative intent and upon completion of concurrent chemoradiation we will plan to get a repeat PET CT scan 8-12 weeks posttreatment.   I will also refer him to dietitian at this time  Agent and his wife had multiple insightful questions and they were all answered to their satisfaction.   Total face to face encounter time for this patient visit was 45 min. >50% of the time was  spent in counseling and coordination of care.    Thank you for this kind referral and the opportunity to participate in the care of this patient   Visit Diagnosis 1. Oropharyngeal cancer ([place])   2. Goals of care, counseling/discussion     Dr. Randa Evens, MD, MPH [facility] at [place] Pager- [phone] 09/17/2017  1:55 PM

## 2017-09-17 NOTE — Progress Notes (Signed)
START ON PATHWAY REGIMEN - Head and Neck     Administer weekly:     Cisplatin   **Always confirm dose/schedule in your pharmacy ordering system**    Patient Characteristics: Oropharynx, HPV Positive, Clinically Staged, T1-4, cN1-3 or T3-4, cN0 Disease Classification: Oropharynx HPV Status: Positive (+) AJCC N Category: cN1 AJCC 8 Stage Grouping: I Current Disease Status: No Distant Metastases and No Recurrent Disease AJCC T Category: T2 AJCC M Category: M0 Intent of Therapy: Curative Intent, Discussed with Patient

## 2017-09-18 ENCOUNTER — Telehealth: Payer: Self-pay | Admitting: *Deleted

## 2017-09-18 NOTE — Telephone Encounter (Signed)
Contacted patient for Procedure of portacath. Patient will arrive at medical mall at [facility] to  the admitting and registration desk to check in on 09/26/2017 and 8 am.  Patient was instructed to be NPO after midnight the night before procedure and to have a driver to be able to take them home.  If there are any medications to take that am please take with sip of water.  Patient understands instructions and if they have questions can call back to cancer center.

## 2017-09-19 ENCOUNTER — Telehealth: Payer: Self-pay | Admitting: *Deleted

## 2017-09-19 NOTE — Telephone Encounter (Signed)
Called pt and left him a message that all the head and neck cancers usually have some issues along the way with swallowing when going through chemo and radiation so we have the patients to see nutritionist.  I made an appt for 10 am on 11/1 and then radiation 11:30.  I asked him to call me for any questions or if he can't make it on that day

## 2017-09-22 NOTE — Patient Instructions (Signed)
Cisplatin injection  What is this medicine?  CISPLATIN (SIS pla tin) is a chemotherapy drug. It targets fast dividing cells, like cancer cells, and causes these cells to die. This medicine is used to treat many types of cancer like bladder, ovarian, and testicular cancers.  This medicine may be used for other purposes; ask your health care provider or pharmacist if you have questions.  COMMON BRAND NAME(S): Platinol, Platinol -AQ  What should I tell my health care provider before I take this medicine?  They need to know if you have any of these conditions:  -blood disorders  -hearing problems  -kidney disease  -recent or ongoing radiation therapy  -an unusual or allergic reaction to cisplatin, carboplatin, other chemotherapy, other medicines, foods, dyes, or preservatives  -pregnant or trying to get pregnant  -breast-feeding  How should I use this medicine?  This drug is given as an infusion into a vein. It is administered in a hospital or clinic by a specially trained health care professional.  Talk to your pediatrician regarding the use of this medicine in children. Special care may be needed.  Overdosage: If you think you have taken too much of this medicine contact a poison control center or emergency room at once.  NOTE: This medicine is only for you. Do not share this medicine with others.  What if I miss a dose?  It is important not to miss a dose. Call your doctor or health care professional if you are unable to keep an appointment.  What may interact with this medicine?  -dofetilide  -foscarnet  -medicines for seizures  -medicines to increase blood counts like filgrastim, pegfilgrastim, sargramostim  -probenecid  -pyridoxine used with altretamine  -rituximab  -some antibiotics like amikacin, gentamicin, neomycin, polymyxin B, streptomycin, tobramycin  -sulfinpyrazone  -vaccines  -zalcitabine  Talk to your doctor or health care professional before taking any of these  medicines:  -acetaminophen  -aspirin  -ibuprofen  -ketoprofen  -naproxen  This list may not describe all possible interactions. Give your health care provider a list of all the medicines, herbs, non-prescription drugs, or dietary supplements you use. Also tell them if you smoke, drink alcohol, or use illegal drugs. Some items may interact with your medicine.  What should I watch for while using this medicine?  Your condition will be monitored carefully while you are receiving this medicine. You will need important blood work done while you are taking this medicine.  This drug may make you feel generally unwell. This is not uncommon, as chemotherapy can affect healthy cells as well as cancer cells. Report any side effects. Continue your course of treatment even though you feel ill unless your doctor tells you to stop.  In some cases, you may be given additional medicines to help with side effects. Follow all directions for their use.  Call your doctor or health care professional for advice if you get a fever, chills or sore throat, or other symptoms of a cold or flu. Do not treat yourself. This drug decreases your body's ability to fight infections. Try to avoid being around people who are sick.  This medicine may increase your risk to bruise or bleed. Call your doctor or health care professional if you notice any unusual bleeding.  Be careful brushing and flossing your teeth or using a toothpick because you may get an infection or bleed more easily. If you have any dental work done, tell your dentist you are receiving this medicine.  Avoid taking products   that contain aspirin, acetaminophen, ibuprofen, naproxen, or ketoprofen unless instructed by your doctor. These medicines may hide a fever.  Do not become pregnant while taking this medicine. Women should inform their doctor if they wish to become pregnant or think they might be pregnant. There is a potential for serious side effects to an unborn child. Talk to  your health care professional or pharmacist for more information. Do not breast-feed an infant while taking this medicine.  Drink fluids as directed while you are taking this medicine. This will help protect your kidneys.  Call your doctor or health care professional if you get diarrhea. Do not treat yourself.  What side effects may I notice from receiving this medicine?  Side effects that you should report to your doctor or health care professional as soon as possible:  -allergic reactions like skin rash, itching or hives, swelling of the face, lips, or tongue  -signs of infection - fever or chills, cough, sore throat, pain or difficulty passing urine  -signs of decreased platelets or bleeding - bruising, pinpoint red spots on the skin, black, tarry stools, nosebleeds  -signs of decreased red blood cells - unusually weak or tired, fainting spells, lightheadedness  -breathing problems  -changes in hearing  -gout pain  -low blood counts - This drug may decrease the number of white blood cells, red blood cells and platelets. You may be at increased risk for infections and bleeding.  -nausea and vomiting  -pain, swelling, redness or irritation at the injection site  -pain, tingling, numbness in the hands or feet  -problems with balance, movement  -trouble passing urine or change in the amount of urine  Side effects that usually do not require medical attention (report to your doctor or health care professional if they continue or are bothersome):  -changes in vision  -loss of appetite  -metallic taste in the mouth or changes in taste  This list may not describe all possible side effects. Call your doctor for medical advice about side effects. You may report side effects to FDA at 1-800-FDA-1088.  Where should I keep my medicine?  This drug is given in a hospital or clinic and will not be stored at home.  NOTE: This sheet is a summary. It may not cover all possible information. If you have questions about this medicine,  talk to your doctor, pharmacist, or health care provider.   2018 Elsevier/Gold Standard (2008-02-23 14:40:54)

## 2017-09-23 ENCOUNTER — Inpatient Hospital Stay: Payer: Federal, State, Local not specified - PPO

## 2017-09-24 ENCOUNTER — Other Ambulatory Visit: Payer: Self-pay | Admitting: Student

## 2017-09-24 ENCOUNTER — Other Ambulatory Visit: Payer: Self-pay | Admitting: General Surgery

## 2017-09-24 DIAGNOSIS — C090 Malignant neoplasm of tonsillar fossa: Secondary | ICD-10-CM | POA: Diagnosis not present

## 2017-09-25 ENCOUNTER — Other Ambulatory Visit: Payer: Self-pay | Admitting: Radiology

## 2017-09-26 ENCOUNTER — Ambulatory Visit
Admission: RE | Admit: 2017-09-26 | Discharge: 2017-09-26 | Disposition: A | Discharge status: Home / Self Care | Payer: Federal, State, Local not specified - PPO | Source: Ambulatory Visit | Attending: Oncology | Admitting: Oncology

## 2017-09-26 ENCOUNTER — Other Ambulatory Visit: Payer: Self-pay | Admitting: Oncology

## 2017-09-26 DIAGNOSIS — C760 Malignant neoplasm of head, face and neck: Secondary | ICD-10-CM | POA: Diagnosis present

## 2017-09-26 DIAGNOSIS — M1990 Unspecified osteoarthritis, unspecified site: Secondary | ICD-10-CM | POA: Diagnosis not present

## 2017-09-26 DIAGNOSIS — F329 Major depressive disorder, single episode, unspecified: Secondary | ICD-10-CM | POA: Diagnosis not present

## 2017-09-26 DIAGNOSIS — C799 Secondary malignant neoplasm of unspecified site: Secondary | ICD-10-CM

## 2017-09-26 DIAGNOSIS — F419 Anxiety disorder, unspecified: Secondary | ICD-10-CM | POA: Diagnosis not present

## 2017-09-26 DIAGNOSIS — Z794 Long term (current) use of insulin: Secondary | ICD-10-CM | POA: Insufficient documentation

## 2017-09-26 DIAGNOSIS — E039 Hypothyroidism, unspecified: Secondary | ICD-10-CM | POA: Insufficient documentation

## 2017-09-26 DIAGNOSIS — IMO0002 Reserved for concepts with insufficient information to code with codable children: Secondary | ICD-10-CM

## 2017-09-26 DIAGNOSIS — I4891 Unspecified atrial fibrillation: Secondary | ICD-10-CM | POA: Diagnosis not present

## 2017-09-26 DIAGNOSIS — K219 Gastro-esophageal reflux disease without esophagitis: Secondary | ICD-10-CM | POA: Insufficient documentation

## 2017-09-26 DIAGNOSIS — M549 Dorsalgia, unspecified: Secondary | ICD-10-CM | POA: Insufficient documentation

## 2017-09-26 DIAGNOSIS — Z880 Allergy status to penicillin: Secondary | ICD-10-CM | POA: Diagnosis not present

## 2017-09-26 DIAGNOSIS — E119 Type 2 diabetes mellitus without complications: Secondary | ICD-10-CM | POA: Insufficient documentation

## 2017-09-26 DIAGNOSIS — Z87891 Personal history of nicotine dependence: Secondary | ICD-10-CM | POA: Insufficient documentation

## 2017-09-26 DIAGNOSIS — G8929 Other chronic pain: Secondary | ICD-10-CM | POA: Diagnosis not present

## 2017-09-26 DIAGNOSIS — I10 Essential (primary) hypertension: Secondary | ICD-10-CM | POA: Insufficient documentation

## 2017-09-26 HISTORY — PX: IR IMAGING GUIDED PORT INSERTION: IMG5740

## 2017-09-26 LAB — CBC
HCT: 42.5 % (ref 40.0–52.0)
Hemoglobin: 14.1 g/dL (ref 13.0–18.0)
MCH: 27.7 pg (ref 26.0–34.0)
MCHC: 33.2 g/dL (ref 32.0–36.0)
MCV: 83.6 fL (ref 80.0–100.0)
Platelets: 194 10*3/uL (ref 150–440)
RBC: 5.09 MIL/uL (ref 4.40–5.90)
RDW: 13.6 % (ref 11.5–14.5)
WBC: 7.9 10*3/uL (ref 3.8–10.6)

## 2017-09-26 LAB — PROTIME-INR
INR: 0.91
Prothrombin Time: 12.2 seconds (ref 11.4–15.2)

## 2017-09-26 LAB — APTT: aPTT: 27 seconds (ref 24–36)

## 2017-09-26 LAB — GLUCOSE, CAPILLARY: Glucose-Capillary: 168 mg/dL — ABNORMAL HIGH (ref 65–99)

## 2017-09-26 MED ORDER — HEPARIN SOD (PORK) LOCK FLUSH 100 UNIT/ML IV SOLN
INTRAVENOUS | Status: AC
  Filled 2017-09-26: qty 5

## 2017-09-26 MED ORDER — LIDOCAINE-EPINEPHRINE (PF) 1 %-1:200000 IJ SOLN
INTRAMUSCULAR | Status: AC
  Filled 2017-09-26: qty 30

## 2017-09-26 MED ORDER — MIDAZOLAM HCL 5 MG/5ML IJ SOLN
INTRAMUSCULAR | Status: AC
  Filled 2017-09-26: qty 5

## 2017-09-26 MED ORDER — SODIUM CHLORIDE 0.9 % IV SOLN
INTRAVENOUS | Status: DC
  Administered 2017-09-26: 09:00:00 via INTRAVENOUS

## 2017-09-26 MED ORDER — FENTANYL CITRATE (PF) 100 MCG/2ML IJ SOLN
INTRAMUSCULAR | Status: AC | PRN
  Administered 2017-09-26: 50 ug via INTRAVENOUS
  Administered 2017-09-26: 25 ug via INTRAVENOUS

## 2017-09-26 MED ORDER — VANCOMYCIN HCL IN DEXTROSE 1-5 GM/200ML-% IV SOLN
1000.0000 mg | INTRAVENOUS | Status: AC
  Administered 2017-09-26: 1000 mg via INTRAVENOUS
  Filled 2017-09-26 (×2): qty 200

## 2017-09-26 MED ORDER — FENTANYL CITRATE (PF) 100 MCG/2ML IJ SOLN
INTRAMUSCULAR | Status: AC
  Filled 2017-09-26: qty 4

## 2017-09-26 MED ORDER — MIDAZOLAM HCL 5 MG/5ML IJ SOLN
INTRAMUSCULAR | Status: AC | PRN
  Administered 2017-09-26: 2 mg via INTRAVENOUS
  Administered 2017-09-26: 1 mg via INTRAVENOUS

## 2017-09-26 NOTE — H&P (Signed)
Chief Complaint: Patient was seen in consultation today for Port-A-Cath placement at the request of [place] C  Referring Physician(s): Rao,Archana C   Patient Status: [facility] - Out-pt  History of Present Illness: Stephen Yu is a 61 y.o. male who is known to the Interventional Radiology service because he had an image guided right neck lymph node recently.  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  Patient will be scheduled for chemotherapy and radiation treatment.  Patient needs Port-A-Cath placement.  Patient has chronic back pain and has a morphine pump.  His pain level is 8 out of 10 today.  He denies fevers, chills or weight loss.  He has good appetite.  He denies any neurologic symptoms, chest pain, abdominal pain or changes in his GI or GU habits.  Past Medical History:  Diagnosis Date  . Anxiety   . Arthritis   . Cancer ([place]) 08/21/2017  . Chronic pain 1996   due to work injury  . Colon polyp    adenomatous polyp ascending colon  . Cough    THAT WONT GO AWAY-PT TO SEE DR Doy Hutching ON 06-10-17 @ 4 PM  . Depression   . Diabetes mellitus without complication ([place])   . Dysrhythmia    AFIB X 1  . GERD (gastroesophageal reflux disease)   . Herniated lumbar intervertebral disc   . Hypertension   . Hypothyroidism   . Neck pain   . Neuromuscular disorder ([place])   . Presence of intrathecal pump    Dr. Mechele Dawley in [place], [place]    Past Surgical History:  Procedure Laterality Date  . COLONOSCOPY  2013   [place]  . COLONOSCOPY WITH PROPOFOL N/A 04/22/2017   Procedure: COLONOSCOPY WITH PROPOFOL;  Surgeon: Lollie Sails, MD;  Location: [facility] END[facility]COPY;  Service: Endoscopy;  Laterality: N/A;  . IMAGE GUIDED SINUS SURGERY    . implanted morphine pump  2016  . LAPAROSCOPIC CHOLECYSTECTOMY  2013  . PARTIAL COLECTOMY Right 06/17/2017   Procedure: PARTIAL COLECTOMY;  Surgeon: Christene Lye, MD;  Location: [facility] ORS;  Service: General;  Laterality: Right;  . UMBILICAL HERNIA REPAIR  2013  . UMBILICAL HERNIA REPAIR  06/17/2017   Procedure: HERNIA REPAIR UMBILICAL ADULT;  Surgeon: Christene Lye, MD;  Location: [facility] ORS;  Service: General;;  . VASECTOMY      Allergies: Biaxin [clarithromycin]; Ceftin [cefuroxime axetil]; and Penicillins  Medications: Prior to Admission medications   Medication Sig Start Date End Date Taking? Authorizing Provider  amitriptyline (ELAVIL) 100 MG tablet Take 50 mg by mouth at bedtime as needed for sleep.     [provider]  Cholecalciferol (VITAMIN D3) 5000 units TABS Take 5,000 Units by mouth daily.     [provider]  citalopram (CELEXA) 40 MG tablet Take 40 mg by mouth every morning.     [provider]  dexamethasone (DECADRON) 4 MG tablet Take 2 tablets by mouth once a day on the day after chemotherapy and then take 2 tablets two times a day for 2 days. Take with food. 09/17/17   Sindy Guadeloupe, MD  esomeprazole (NEXIUM) 40 MG capsule Take 40 mg by mouth daily as needed.     [provider]  glyBURIDE-metformin (GLUCOVANCE) 5-500 MG tablet Take 0.5 tablets by mouth See admin instructions. Takes 0.5 tablet twice daily in morning and bedtime and takes a 3rd dose only if needed for elevated blood  sugar    [provider]  HYDROcodone-acetaminophen (NORCO) 7.5-325 MG tablet Take 1 tablet by mouth every 6 (six) hours as needed for moderate pain.     [provider]  Insulin Glargine (BASAGLAR KWIKPEN) 100 UNIT/ML SOPN Inject 40 Units into the skin daily before breakfast.     [provider]  KRILL OIL PO Take 1 capsule by mouth daily.     [provider]  lidocaine-prilocaine (EMLA) cream Apply to affected area once 09/17/17   Sindy Guadeloupe, MD  lisinopril-hydrochlorothiazide (PRINZIDE,ZESTORETIC) 20-25 MG tablet Take 1 tablet by mouth daily.     [provider]  LORazepam (ATIVAN) 0.5 MG tablet Take 1 tablet (0.5 mg total) by mouth every 6 (six) hours as needed (Nausea or vomiting). 09/17/17   Sindy Guadeloupe, MD  MORPHINE SULFATE IJ Inject as directed. Morphine 4mg /ml epidural pump. 1.4897 mg/day    [provider]  niacin 500 MG tablet Take 500 mg by mouth at bedtime.    [provider]  ondansetron (ZOFRAN) 8 MG tablet Take 1 tablet (8 mg total) by mouth 2 (two) times daily as needed. Start on the third day after chemotherapy. 09/17/17   Sindy Guadeloupe, MD  oxymetazoline (AFRIN) 0.05 % nasal spray Place 1 spray into both nostrils 2 (two) times daily as needed for congestion.    [provider]  pregabalin (LYRICA) 150 MG capsule Take 150 mg by mouth 2 (two) times daily.    [provider]  prochlorperazine (COMPAZINE) 10 MG tablet Take 1 tablet (10 mg total) by mouth every 6 (six) hours as needed (Nausea or vomiting). 09/17/17   Sindy Guadeloupe, MD  thyroid (ARMOUR) 32.5 MG tablet Take 32.5 mg by mouth every morning.     [provider]  tiZANidine (ZANAFLEX) 4 MG tablet Take 4 mg by mouth every 8 (eight) hours as needed for muscle spasms.    [provider]  vitamin E (VITAMIN E) 400 UNIT capsule Take 400 Units by mouth daily.    [provider]  zinc gluconate 50 MG tablet Take 50 mg by mouth daily.    [provider]     Family History  Problem Relation Age of Onset  . Breast cancer Cousin 88  . Cancer - Colon Neg Hx   . Prostate cancer Neg Hx     Social History   Social History  . Marital status: Married    Spouse name: N/A  . Number of children: N/A  . Years of education: N/A   Social History Main Topics  . Smoking status: Former Smoker    Years: 20.00    Types: Cigarettes    Quit date: 12/03/1999  . Smokeless tobacco: Never Used  . Alcohol use No  . Drug use: No  . Sexual activity: Yes   Other Topics Concern  . None   Social History  Narrative  . None      Review of Systems: A 12 point ROS discussed and pertinent positives are indicated in the HPI above.  All other systems are negative.  Review of Systems  Constitutional: Negative for chills and fever.  Respiratory: Negative.   Cardiovascular: Negative.   Gastrointestinal: Negative.   Genitourinary: Negative.   Musculoskeletal: Positive for back pain.    Vital Signs: BP 116/74   Pulse 64   Temp 98.1 F (36.7 C) (Oral)   Resp 18   SpO2 95%   Physical Exam  Constitutional: No  distress.  HENT:  Mouth/Throat: Oropharynx is clear and moist.  Cardiovascular: Normal rate, regular rhythm and normal heart sounds.   Pulmonary/Chest: Effort normal and breath sounds normal.  Abdominal: Soft. Bowel sounds are normal. He exhibits no distension. There is no tenderness.    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 EXAM: NUCLEAR MEDICINE PET SKULL BASE TO THIGH TECHNIQUE: 12.4 mCi F-18 FDG was injected intravenously. Full-ring PET imaging was performed from the skull base to thigh after the radiotracer. CT data was obtained and used for attenuation correction and anatomic localization. FASTING BLOOD GLUCOSE:  Value: 176 mg/dl COMPARISON:  CT neck 08/12/2017 FINDINGS: NECK Right palatine tonsillar/oropharyngeal mass seen on recent neck CT has a maximum SUV of 8.5, compatible with malignancy. Right level IIa lymph nodes are present, with the larger node measuring 1.6 cm in short axis on image 40/3 with maximum SUV 4.9, compatible with malignant involvement. CHEST No hypermetabolic mediastinal or hilar nodes. No suspicious pulmonary nodules on the CT data. Coronary, aortic arch, and branch vessel atherosclerotic vascular disease. Small mediastinal lymph nodes are not pathologically enlarged by size criteria. Mild  gynecomastia. ABDOMEN/PELVIS No abnormal hypermetabolic activity within the liver, pancreas, adrenal glands, or spleen. No hypermetabolic lymph nodes in the abdomen or pelvis. Diffuse hepatic steatosis. Cholecystectomy. Photopenic left kidney upper pole cyst. 1.1 cm peripancreatic lymph node on image 142/3, without associated hypermetabolic activity. Right hemicolectomy. Aortoiliac atherosclerotic vascular disease. Subcutaneous left lower quadrant pump device, discontinuous intrathecal of portion of the catheter. SKELETON No focal hypermetabolic activity to suggest skeletal metastasis. IMPRESSION: 1. Right palatine tonsillar mass maximum SUV 8.5 compatible with malignancy. Several right level IIa lymph nodes are present and are hypermetabolic, larger node measuring 1.6 cm in short axis. 2. No evidence of metastatic spread to the chest, abdomen/pelvis, or visualized skeleton. 3. The patient has a subcutaneous left lower quadrant pump device. Catheter related to this device the enters the thecal sac at approximately the L2- 3 level and extends cephalad in the anterior midline. There appears to be a separate fragment of catheter extending caudad from the L2- 3 level down to the upper sacral level. 4. Other imaging findings of potential clinical significance: Aortic Atherosclerosis (ICD10-I70.0). Coronary atherosclerosis. Diffuse hepatic steatosis. Prior right hemicolectomy. Electronically Signed   By: Van Clines M.D.   On: 09/10/2017 09:55    Labs:  CBC:  Recent Labs  06/18/17 0427 06/19/17 0415 08/21/17 0956 09/26/17 0803  WBC 13.6* 11.6* 5.8 7.9  HGB 12.9* 10.3* 13.9 14.1  HCT 37.6* 30.1* 40.7 42.5  PLT 222 171 195 194    COAGS:  Recent Labs  08/21/17 0956  INR 0.90  APTT 27    BMP:  Recent Labs  06/17/17 0639 06/17/17 1514 06/18/17 0427 08/12/17 1417  NA 136  --  136  --   K 4.1  --  3.8  --   CL  --   --  103  --   CO2  --   --  27  --   GLUCOSE 156*  --  159*  --     BUN  --   --  15  --   CALCIUM  --   --  8.3*  --   CREATININE  --  1.26* 1.01 1.20  GFRNONAA  --  59* >60  --   GFRAA  --  >  60 >60  --     LIVER FUNCTION TESTS: No results for input(s): BILITOT, AST, ALT, ALKPHOS, PROT, ALBUMIN in the last 8760 hours.  TUMOR MARKERS: No results for input(s): AFPTM, CEA, CA199, CHROMGRNA in the last 8760 hours.  Assessment and Plan:  61 year old with squamous cell carcinoma and needs Port-A-Cath placement for treatment.  Patient is scheduled to start chemotherapy next week.  We discussed the risks and benefits of placement of a Port-A-Cath.  Patient has a good understanding of the procedure and informed consent was obtained.  Patient will get vancomycin for antibiotic prophylaxis prior to the procedure.  Plan for moderate sedation.  Thank you for this interesting consult.  I greatly enjoyed meeting Momen Ham and look forward to participating in their care.  A copy of this report was sent to the requesting provider on this date.  Electronically Signed: Carylon Perches, MD 09/26/2017, 8:33 AM   I spent a total of    15 Minutes in face to face in clinical consultation, greater than 50% of which was counseling/coordinating care for port placement.

## 2017-09-26 NOTE — Progress Notes (Signed)
Pt sitting up eating and drinking w/o difficulty, discharge instructions reviewed with pt and family by IR nurse, pt and family verbalize understanding.  Pt in NAD, VSS at this time

## 2017-09-26 NOTE — Procedures (Signed)
Placement of left IJ port.  Tip at SVC/RA junction.  Minimal blood loss and no immediate complication.   

## 2017-09-29 ENCOUNTER — Ambulatory Visit
Admission: RE | Admit: 2017-09-29 | Discharge: 2017-09-29 | Disposition: A | Discharge status: Home / Self Care | Payer: Federal, State, Local not specified - PPO | Source: Ambulatory Visit | Attending: Radiation Oncology | Admitting: Radiation Oncology

## 2017-09-29 NOTE — Progress Notes (Signed)
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 Patient is a 61 year old male who was found to have a right neck mass about 1 year ago when he underwent morphine pump placement. At that time he was seen by ENT and also went through antibiotic course but was not found to have a malignancy back then. Patient recently underwent a hemicolectomy by Dr. Jamal Collin as he was found to have a colonic polyp which was not resectable on colonoscopy. Shortly following the surgery patient again noticed right sided neck mass. He wasttreated with empiric antibiotics by Dr. Doy Hutching but the mass did not go away and he was referred to Dr. Tami Ribas. Patient was found to have tonsillar asymmetry on NPL exam and underwent CT neck  2. CT soft tissue of the neck. Multiple right-sided lymph nodes at level IIA and 2B between 1.5-1.8 cm in size which were suspicious in appearance.also noted to have started asymmetric soft tissue fullness in the oropharynx or on the right lateral wall in the right palatine tonsil region measuring up to 2.7 cm.  3. 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 This was followed by a PET CT scan which showed right palatine tonsillar mass with a maximum SUV of 8.5 compatible with malignancy.  Several right level IIA lymph nodes are present and are hypermetabolic, larger node measuring 1.6 cm in short axis. No evidence of metastatic spread to the chest abdomen and pelvis or visualized skeleton  5. Patient has been referred to [place] for further management. Currently patient reports some pain and discomfort in his right neck. Denies any difficulty swallowing. His appetite is good and he does not report any unintentional weight loss. He does have chronic pain for which she has a morphine pump in place  Interval history- doing well. deneis any pain. Appetite is goog.   ECOG PS- 0 Pain scale- 0   Review of systems- Review of Systems  Constitutional: Negative for chills, fever, malaise/fatigue and weight loss.  HENT: Negative for congestion, ear discharge and nosebleeds.   Eyes: Negative for blurred vision.  Respiratory: Negative for cough, hemoptysis, sputum production, shortness of breath and wheezing.   Cardiovascular: Negative for chest pain, palpitations, orthopnea and claudication.  Gastrointestinal: Negative for abdominal pain, blood in stool, constipation, diarrhea, heartburn, melena, nausea and vomiting.  Genitourinary: Negative for dysuria, flank pain, frequency, hematuria and urgency.  Musculoskeletal: Negative for back pain, joint pain and myalgias.  Skin: Negative for rash.  Neurological: Negative for dizziness, tingling, focal weakness, seizures, weakness and headaches.  Endo/Heme/Allergies: Does not bruise/bleed easily.  Psychiatric/Behavioral: Negative for depression and suicidal ideas. The patient does not have insomnia.     Allergies  Allergen Reactions  . Biaxin [Clarithromycin] Other (See Comments)    Severe hiccups  . Ceftin [Cefuroxime Axetil] Other (See Comments)    Severe hiccups  . Penicillins Hives  Has patient had a PCN reaction causing immediate rash, facial/tongue/throat swelling, SOB or lightheadedness with hypotension: No Has patient had a PCN  reaction causing severe rash involving mucus membranes or skin necrosis: unknown Has patient had a PCN reaction that required hospitalization: No Has patient had a PCN reaction occurring within the last 10 years: No If all of the above answers are "NO", then may proceed with Cephalosporin use.      Past Medical History:  Diagnosis Date  . Anxiety   . Arthritis   . Cancer ([place]) 08/21/2017  . Chronic pain 1996   due to work injury  . Colon polyp    adenomatous polyp ascending colon  . Cough    THAT WONT GO AWAY-PT TO SEE DR Doy Hutching ON 06-10-17 @ 4 PM  . Depression   . Diabetes mellitus without complication ([place])   . Dysrhythmia    AFIB X 1  . GERD (gastroesophageal reflux disease)   . Herniated lumbar intervertebral disc   . Hypertension   . Hypothyroidism   . Neck pain   . Neuromuscular disorder ([place])   . Presence of intrathecal pump    Dr. Mechele Dawley in [place], [place]     Past Surgical History:  Procedure Laterality Date  . COLONOSCOPY  2013   [place]  . COLONOSCOPY WITH PROPOFOL N/A 04/22/2017   Procedure: COLONOSCOPY WITH PROPOFOL;  Surgeon: Lollie Sails, MD;  Location: [facility] ENDOSCOPY;  Service: Endoscopy;  Laterality: N/A;  . IMAGE GUIDED SINUS SURGERY    . implanted morphine pump  2016  . IR FLUORO GUIDE PORT INSERTION LEFT  09/26/2017  . LAPAROSCOPIC CHOLECYSTECTOMY  2013  . PARTIAL COLECTOMY Right 06/17/2017   Procedure: PARTIAL COLECTOMY;  Surgeon: Christene Lye, MD;  Location: [facility] ORS;  Service: General;  Laterality: Right;  . UMBILICAL HERNIA REPAIR  2013  . UMBILICAL HERNIA REPAIR  06/17/2017   Procedure: HERNIA REPAIR UMBILICAL ADULT;  Surgeon: Christene Lye, MD;  Location: [facility] ORS;  Service: General;;  . VASECTOMY      Social History   Social History  . Marital status: Married    Spouse name: N/A  . Number of children: N/A  . Years of education: N/A   Occupational History  . Not on file.   Social History  Main Topics  . Smoking status: Former Smoker    Years: 20.00    Types: Cigarettes    Quit date: 12/03/1999  . Smokeless tobacco: Never Used  . Alcohol use No  . Drug use: No  . Sexual activity: Yes   Other Topics Concern  . Not on file   Social History Narrative  . No narrative on file    Family History  Problem Relation Age of Onset  . Breast cancer Cousin 57  . Cancer - Colon Neg Hx   . Prostate cancer Neg Hx      Current Outpatient Prescriptions:  .  amitriptyline (ELAVIL) 100 MG tablet, Take 50 mg by mouth at bedtime as needed for sleep. , Disp: , Rfl:  .  Cholecalciferol (VITAMIN D3) 5000 units TABS, Take 5,000 Units by mouth daily. , Disp: , Rfl:  .  citalopram (CELEXA) 40 MG tablet, Take 40 mg by mouth every morning. , Disp: , Rfl:  .  dexamethasone (DECADRON) 4 MG tablet, Take 2 tablets by mouth once a day on the day after chemotherapy and then take 2 tablets two times a day for 2 days. Take with food., Disp:  30 tablet, Rfl: 1 .  esomeprazole (NEXIUM) 40 MG capsule, Take 40 mg by mouth daily as needed. , Disp: , Rfl:  .  glyBURIDE-metformin (GLUCOVANCE) 5-500 MG tablet, Take 0.5 tablets by mouth See admin instructions. Takes 0.5 tablet twice daily in morning and bedtime and takes a 3rd dose only if needed for elevated blood sugar, Disp: , Rfl:  .  HYDROcodone-acetaminophen (NORCO) 7.5-325 MG tablet, Take 1 tablet by mouth every 6 (six) hours as needed for moderate pain. , Disp: , Rfl:  .  Insulin Glargine (BASAGLAR KWIKPEN) 100 UNIT/ML SOPN, Inject 40 Units into the skin daily before breakfast. , Disp: , Rfl:  .  KRILL OIL PO, Take 1 capsule by mouth daily. , Disp: , Rfl:  .  lidocaine-prilocaine (EMLA) cream, Apply to affected area once, Disp: 30 g, Rfl: 3 .  lisinopril-hydrochlorothiazide (PRINZIDE,ZESTORETIC) 20-25 MG tablet, Take 1 tablet by mouth daily. , Disp: , Rfl:  .  LORazepam (ATIVAN) 0.5 MG tablet, Take 1 tablet (0.5 mg total) by mouth every 6 (six) hours as  needed (Nausea or vomiting)., Disp: 30 tablet, Rfl: 0 .  MORPHINE SULFATE IJ, Inject as directed. Morphine 4mg /ml epidural pump. 1.4897 mg/day, Disp: , Rfl:  .  niacin 500 MG tablet, Take 500 mg by mouth at bedtime., Disp: , Rfl:  .  ondansetron (ZOFRAN) 8 MG tablet, Take 1 tablet (8 mg total) by mouth 2 (two) times daily as needed. Start on the third day after chemotherapy., Disp: 30 tablet, Rfl: 1 .  oxymetazoline (AFRIN) 0.05 % nasal spray, Place 1 spray into both nostrils 2 (two) times daily as needed for congestion., Disp: , Rfl:  .  pregabalin (LYRICA) 150 MG capsule, Take 150 mg by mouth 2 (two) times daily., Disp: , Rfl:  .  prochlorperazine (COMPAZINE) 10 MG tablet, Take 1 tablet (10 mg total) by mouth every 6 (six) hours as needed (Nausea or vomiting)., Disp: 30 tablet, Rfl: 1 .  thyroid (ARMOUR) 32.5 MG tablet, Take 32.5 mg by mouth every morning. , Disp: , Rfl:  .  tiZANidine (ZANAFLEX) 4 MG tablet, Take 4 mg by mouth every 8 (eight) hours as needed for muscle spasms., Disp: , Rfl:  .  vitamin E (VITAMIN E) 400 UNIT capsule, Take 400 Units by mouth daily., Disp: , Rfl:  .  zinc gluconate 50 MG tablet, Take 50 mg by mouth daily., Disp: , Rfl:   Physical exam: There were no vitals filed for this visit. Physical Exam  Constitutional: He is oriented to person, place, and time and well-developed, well-nourished, and in no distress.  HENT:  Head: Normocephalic and atraumatic.  Mouth/Throat: Oropharynx is clear and moist.  Eyes: Pupils are equal, round, and reactive to light. EOM are normal.  Neck: Normal range of motion.  Cardiovascular: Normal rate, regular rhythm and normal heart sounds.   Pulmonary/Chest: Effort normal and breath sounds normal.  Left chest wall port in place  Abdominal: Soft. Bowel sounds are normal.  Lymphadenopathy:  Palpable right cervical adenopathy  Neurological: He is alert and oriented to person, place, and time.  Skin: Skin is warm and dry.     CMP  Latest Ref Rng & Units 09/30/2017  Glucose 65 - 99 mg/dL 252(H)  BUN 6 - 20 mg/dL 24(H)  Creatinine 0.61 - 1.24 mg/dL 1.04  Sodium 135 - 145 mmol/L 133(L)  Potassium 3.5 - 5.1 mmol/L 3.9  Chloride 101 - 111 mmol/L 97(L)  CO2 22 - 32 mmol/L 27  Calcium  8.9 - 10.3 mg/dL 9.6   CBC Latest Ref Rng & Units 09/30/2017  WBC 3.8 - 10.6 K/uL 9.2  Hemoglobin 13.0 - 18.0 g/dL 14.2  Hematocrit 40.0 - 52.0 % 41.7  Platelets 150 - 440 K/uL 202    No images are attached to the encounter.  Nm Pet Image Initial (pi) Skull Base To Thigh  Result Date: 09/10/2017 CLINICAL DATA:  Initial treatment strategy for squamous cell carcinoma of the right oropharynx with associated nodal disease. EXAM: NUCLEAR MEDICINE PET SKULL BASE TO THIGH TECHNIQUE: 12.4 mCi F-18 FDG was injected intravenously. Full-ring PET imaging was performed from the skull base to thigh after the radiotracer. CT data was obtained and used for attenuation correction and anatomic localization. FASTING BLOOD GLUCOSE:  Value: 176 mg/dl COMPARISON:  CT neck 08/12/2017 FINDINGS: NECK Right palatine tonsillar/oropharyngeal mass seen on recent neck CT has a maximum SUV of 8.5, compatible with malignancy. Right level IIa lymph nodes are present, with the larger node measuring 1.6 cm in short axis on image 40/3 with maximum SUV 4.9, compatible with malignant involvement. CHEST No hypermetabolic mediastinal or hilar nodes. No suspicious pulmonary nodules on the CT data. Coronary, aortic arch, and branch vessel atherosclerotic vascular disease. Small mediastinal lymph nodes are not pathologically enlarged by size criteria. Mild gynecomastia. ABDOMEN/PELVIS No abnormal hypermetabolic activity within the liver, pancreas, adrenal glands, or spleen. No hypermetabolic lymph nodes in the abdomen or pelvis. Diffuse hepatic steatosis. Cholecystectomy. Photopenic left kidney upper pole cyst. 1.1 cm peripancreatic lymph node on image 142/3, without associated  hypermetabolic activity. Right hemicolectomy. Aortoiliac atherosclerotic vascular disease. Subcutaneous left lower quadrant pump device, discontinuous intrathecal of portion of the catheter. SKELETON No focal hypermetabolic activity to suggest skeletal metastasis. IMPRESSION: 1. Right palatine tonsillar mass maximum SUV 8.5 compatible with malignancy. Several right level IIa lymph nodes are present and are hypermetabolic, larger node measuring 1.6 cm in short axis. 2. No evidence of metastatic spread to the chest, abdomen/pelvis, or visualized skeleton. 3. The patient has a subcutaneous left lower quadrant pump device. Catheter related to this device the enters the thecal sac at approximately the L2- 3 level and extends cephalad in the anterior midline. There appears to be a separate fragment of catheter extending caudad from the L2- 3 level down to the upper sacral level. 4. Other imaging findings of potential clinical significance: Aortic Atherosclerosis (ICD10-I70.0). Coronary atherosclerosis. Diffuse hepatic steatosis. Prior right hemicolectomy. Electronically Signed   By: Van Clines M.D.   On: 09/10/2017 09:55   Ir Fluoro Guide Port Insertion Left  Result Date: 09/26/2017 INDICATION: 61 year old with squamous cell carcinoma of the neck. Port-A-Cath needed for chemotherapy. EXAM: FLUOROSCOPIC AND ULTRASOUND GUIDED PLACEMENT OF A SUBCUTANEOUS PORT. Physician: Stephan Minister. Anselm Pancoast, MD MEDICATIONS: Vancomycin 1 gm IV; As antibiotic prophylaxis, Ancef 1 gm was ordered pre-procedure and administered intravenously within one hour of incision. ANESTHESIA/SEDATION: Versed 3.0 mg IV; Fentanyl 75 mcg IV; Moderate Sedation Time:  33 minutes The patient was continuously monitored during the procedure by the interventional radiology nurse under my direct supervision. FLUOROSCOPY TIME:  36 seconds, 9 mGy COMPLICATIONS: None immediate. PROCEDURE: The risks of the procedure were explained to the patient. Informed consent  was obtained. Patient was placed supine on the interventional table. Right cervical lymphadenopathy was evaluated with ultrasound. Based on the location of the lymphadenopathy and upcoming radiation, decided to evaluate the left side of the neck for port placement. Ultrasound confirmed a patent left internal jugular vein. The left chest and neck were  cleaned with a skin antiseptic and a sterile drape was placed. Maximal barrier sterile technique was utilized including caps, mask, sterile gowns, sterile gloves, sterile drape, hand hygiene and skin antiseptic. The left neck was anesthetized with 1% lidocaine. Small incision was made in the left neck with a blade. Micropuncture set was placed in the left IJ with ultrasound guidance. The micropuncture wire was used for measurement purposes. The left chest was anesthetized with 1% lidocaine with epinephrine. #15 blade was used to make an incision and a subcutaneous port pocket was formed. [place] was assembled. Subcutaneous tunnel was formed with a stiff tunneling device. The port catheter was brought through the subcutaneous tunnel. The port was placed in the subcutaneous pocket. The micropuncture set was exchanged for a peel-away sheath. The catheter was placed through the peel-away sheath and the tip was positioned at the superior cavoatrial junction. Catheter placement was confirmed with fluoroscopy. The port was accessed and flushed with heparinized saline. The port pocket was closed using two layers of absorbable sutures and Dermabond. The vein skin site was closed using a single layer of absorbable suture and Dermabond. Sterile dressings were applied. Patient tolerated the procedure well without an immediate complication. Ultrasound and fluoroscopic images were taken and saved for this procedure. IMPRESSION: Placement of a subcutaneous port device. The catheter tip at the superior cavoatrial junction and ready to be used. Electronically Signed   By:  Markus Daft M.D.   On: 09/26/2017 14:50     Assessment and plan- Patient is a 61 y.o. male with SCC of oropharynx Stage I T2N1M0 here for on treatment assessment prior to cycle 1of weekly cisplatin/RT  Counts ok to proceed with cycle 1 of weekly cisplatin today. He has started radiation today as well. 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 I will see him back in  1 weeks time with cbc, cmp for cycle 2 of weekly cisplatin. I have encouraged him to keep with oral intake and fluids. Use glucerna for protein shakes instead of ensure given his elevated blood sugars  Hyperglycemia- patient states BS are well controlled at home. He did not take his metformin this morning    Visit Diagnosis 1. Oropharyngeal cancer ([place])   2. Encounter for antineoplastic chemotherapy      Dr. Randa Evens, MD, MPH [facility] at [facility] Pager- [phone] 09/30/2017 9:50 AM

## 2017-09-30 ENCOUNTER — Inpatient Hospital Stay: Payer: Federal, State, Local not specified - PPO

## 2017-09-30 ENCOUNTER — Inpatient Hospital Stay (HOSPITAL_BASED_OUTPATIENT_CLINIC_OR_DEPARTMENT_OTHER): Payer: Federal, State, Local not specified - PPO | Admitting: Oncology

## 2017-09-30 ENCOUNTER — Ambulatory Visit
Admission: RE | Admit: 2017-09-30 | Discharge: 2017-09-30 | Disposition: A | Discharge status: Home / Self Care | Payer: Federal, State, Local not specified - PPO | Source: Ambulatory Visit | Attending: Radiation Oncology | Admitting: Radiation Oncology

## 2017-09-30 ENCOUNTER — Encounter: Payer: Self-pay | Admitting: Oncology

## 2017-09-30 ENCOUNTER — Other Ambulatory Visit: Payer: Self-pay

## 2017-09-30 VITALS — BP 118/75 | HR 87 | Temp 98.3°F | Resp 18 | Wt 236.6 lb

## 2017-09-30 DIAGNOSIS — E119 Type 2 diabetes mellitus without complications: Secondary | ICD-10-CM | POA: Diagnosis not present

## 2017-09-30 DIAGNOSIS — Z803 Family history of malignant neoplasm of breast: Secondary | ICD-10-CM

## 2017-09-30 DIAGNOSIS — I4891 Unspecified atrial fibrillation: Secondary | ICD-10-CM | POA: Diagnosis not present

## 2017-09-30 DIAGNOSIS — M549 Dorsalgia, unspecified: Secondary | ICD-10-CM

## 2017-09-30 DIAGNOSIS — E039 Hypothyroidism, unspecified: Secondary | ICD-10-CM

## 2017-09-30 DIAGNOSIS — K219 Gastro-esophageal reflux disease without esophagitis: Secondary | ICD-10-CM

## 2017-09-30 DIAGNOSIS — G8929 Other chronic pain: Secondary | ICD-10-CM

## 2017-09-30 DIAGNOSIS — Z87891 Personal history of nicotine dependence: Secondary | ICD-10-CM

## 2017-09-30 DIAGNOSIS — C801 Malignant (primary) neoplasm, unspecified: Secondary | ICD-10-CM

## 2017-09-30 DIAGNOSIS — M129 Arthropathy, unspecified: Secondary | ICD-10-CM

## 2017-09-30 DIAGNOSIS — F329 Major depressive disorder, single episode, unspecified: Secondary | ICD-10-CM

## 2017-09-30 DIAGNOSIS — Z79899 Other long term (current) drug therapy: Secondary | ICD-10-CM

## 2017-09-30 DIAGNOSIS — I10 Essential (primary) hypertension: Secondary | ICD-10-CM | POA: Diagnosis not present

## 2017-09-30 DIAGNOSIS — C099 Malignant neoplasm of tonsil, unspecified: Secondary | ICD-10-CM

## 2017-09-30 DIAGNOSIS — R05 Cough: Secondary | ICD-10-CM

## 2017-09-30 DIAGNOSIS — C109 Malignant neoplasm of oropharynx, unspecified: Secondary | ICD-10-CM

## 2017-09-30 DIAGNOSIS — N62 Hypertrophy of breast: Secondary | ICD-10-CM | POA: Diagnosis not present

## 2017-09-30 DIAGNOSIS — Z86010 Personal history of colonic polyps: Secondary | ICD-10-CM

## 2017-09-30 DIAGNOSIS — N281 Cyst of kidney, acquired: Secondary | ICD-10-CM

## 2017-09-30 DIAGNOSIS — M542 Cervicalgia: Secondary | ICD-10-CM

## 2017-09-30 DIAGNOSIS — G709 Myoneural disorder, unspecified: Secondary | ICD-10-CM

## 2017-09-30 DIAGNOSIS — I2510 Atherosclerotic heart disease of native coronary artery without angina pectoris: Secondary | ICD-10-CM

## 2017-09-30 DIAGNOSIS — Z5111 Encounter for antineoplastic chemotherapy: Secondary | ICD-10-CM

## 2017-09-30 DIAGNOSIS — C090 Malignant neoplasm of tonsillar fossa: Secondary | ICD-10-CM | POA: Diagnosis not present

## 2017-09-30 DIAGNOSIS — Z794 Long term (current) use of insulin: Secondary | ICD-10-CM

## 2017-09-30 DIAGNOSIS — F419 Anxiety disorder, unspecified: Secondary | ICD-10-CM | POA: Diagnosis not present

## 2017-09-30 DIAGNOSIS — K760 Fatty (change of) liver, not elsewhere classified: Secondary | ICD-10-CM

## 2017-09-30 DIAGNOSIS — I700 Atherosclerosis of aorta: Secondary | ICD-10-CM

## 2017-09-30 LAB — CBC WITH DIFFERENTIAL/PLATELET
Basophils Absolute: 0.1 10*3/uL (ref 0–0.1)
Basophils Relative: 1 %
Eosinophils Absolute: 0.3 10*3/uL (ref 0–0.7)
Eosinophils Relative: 4 %
HCT: 41.7 % (ref 40.0–52.0)
Hemoglobin: 14.2 g/dL (ref 13.0–18.0)
Lymphocytes Relative: 28 %
Lymphs Abs: 2.6 10*3/uL (ref 1.0–3.6)
MCH: 28.5 pg (ref 26.0–34.0)
MCHC: 34 g/dL (ref 32.0–36.0)
MCV: 83.8 fL (ref 80.0–100.0)
Monocytes Absolute: 0.7 10*3/uL (ref 0.2–1.0)
Monocytes Relative: 8 %
Neutro Abs: 5.4 10*3/uL (ref 1.4–6.5)
Neutrophils Relative %: 59 %
Platelets: 202 10*3/uL (ref 150–440)
RBC: 4.98 MIL/uL (ref 4.40–5.90)
RDW: 13.9 % (ref 11.5–14.5)
WBC: 9.2 10*3/uL (ref 3.8–10.6)

## 2017-09-30 LAB — BASIC METABOLIC PANEL WITH GFR
Anion gap: 9 (ref 5–15)
BUN: 24 mg/dL — ABNORMAL HIGH (ref 6–20)
CO2: 27 mmol/L (ref 22–32)
Calcium: 9.6 mg/dL (ref 8.9–10.3)
Chloride: 97 mmol/L — ABNORMAL LOW (ref 101–111)
Creatinine, Ser: 1.04 mg/dL (ref 0.61–1.24)
GFR calc Af Amer: >60 mL/min
GFR calc non Af Amer: >60 mL/min
Glucose, Bld: 252 mg/dL — ABNORMAL HIGH (ref 65–99)
Potassium: 3.9 mmol/L (ref 3.5–5.1)
Sodium: 133 mmol/L — ABNORMAL LOW (ref 135–145)

## 2017-09-30 MED ORDER — SODIUM CHLORIDE 0.9 % IV SOLN
40.0000 mg/m2 | Freq: Once | INTRAVENOUS | Status: AC
  Administered 2017-09-30: 92 mg via INTRAVENOUS
  Filled 2017-09-30: qty 92

## 2017-09-30 MED ORDER — HEPARIN SOD (PORK) LOCK FLUSH 100 UNIT/ML IV SOLN: 500.0000 [IU] | Freq: Once | INTRAVENOUS | Status: DC | PRN

## 2017-09-30 MED ORDER — HEPARIN SOD (PORK) LOCK FLUSH 100 UNIT/ML IV SOLN
500.0000 [IU] | Freq: Once | INTRAVENOUS | Status: AC
  Administered 2017-09-30: 500 [IU] via INTRAVENOUS
  Filled 2017-09-30: qty 5

## 2017-09-30 MED ORDER — POTASSIUM CHLORIDE 2 MEQ/ML IV SOLN
Freq: Once | INTRAVENOUS | Status: AC
  Administered 2017-09-30: 10:00:00 via INTRAVENOUS
  Filled 2017-09-30: qty 1000

## 2017-09-30 MED ORDER — PALONOSETRON HCL INJECTION 0.25 MG/5ML
0.2500 mg | Freq: Once | INTRAVENOUS | Status: AC
  Administered 2017-09-30: 0.25 mg via INTRAVENOUS
  Filled 2017-09-30: qty 5

## 2017-09-30 MED ORDER — SODIUM CHLORIDE 0.9% FLUSH
10.0000 mL | INTRAVENOUS | Status: DC | PRN
  Administered 2017-09-30: 10 mL via INTRAVENOUS
  Filled 2017-09-30: qty 10

## 2017-09-30 MED ORDER — SODIUM CHLORIDE 0.9 % IV SOLN
Freq: Once | INTRAVENOUS | Status: AC
  Administered 2017-09-30: 12:00:00 via INTRAVENOUS
  Filled 2017-09-30: qty 5

## 2017-09-30 MED ORDER — SODIUM CHLORIDE 0.9 % IV SOLN
Freq: Once | INTRAVENOUS | Status: AC
  Administered 2017-09-30: 10:00:00 via INTRAVENOUS
  Filled 2017-09-30: qty 1000

## 2017-09-30 NOTE — Progress Notes (Signed)
Pt in today for follow up, first chemotherapy treatment scheduled for today.

## 2017-10-01 ENCOUNTER — Ambulatory Visit
Admission: RE | Admit: 2017-10-01 | Discharge: 2017-10-01 | Disposition: A | Discharge status: Home / Self Care | Payer: Federal, State, Local not specified - PPO | Source: Ambulatory Visit | Attending: Radiation Oncology | Admitting: Radiation Oncology

## 2017-10-01 DIAGNOSIS — C090 Malignant neoplasm of tonsillar fossa: Secondary | ICD-10-CM | POA: Diagnosis not present

## 2017-10-02 ENCOUNTER — Ambulatory Visit
Admission: RE | Admit: 2017-10-02 | Discharge: 2017-10-02 | Disposition: A | Discharge status: Home / Self Care | Payer: Federal, State, Local not specified - PPO | Source: Ambulatory Visit | Attending: Radiation Oncology | Admitting: Radiation Oncology

## 2017-10-02 ENCOUNTER — Inpatient Hospital Stay: Payer: Federal, State, Local not specified - PPO | Attending: Oncology

## 2017-10-02 DIAGNOSIS — Z86010 Personal history of colonic polyps: Secondary | ICD-10-CM | POA: Insufficient documentation

## 2017-10-02 DIAGNOSIS — K219 Gastro-esophageal reflux disease without esophagitis: Secondary | ICD-10-CM | POA: Insufficient documentation

## 2017-10-02 DIAGNOSIS — N179 Acute kidney failure, unspecified: Secondary | ICD-10-CM | POA: Insufficient documentation

## 2017-10-02 DIAGNOSIS — Z5111 Encounter for antineoplastic chemotherapy: Secondary | ICD-10-CM | POA: Insufficient documentation

## 2017-10-02 DIAGNOSIS — I480 Paroxysmal atrial fibrillation: Secondary | ICD-10-CM | POA: Insufficient documentation

## 2017-10-02 DIAGNOSIS — K760 Fatty (change of) liver, not elsewhere classified: Secondary | ICD-10-CM | POA: Insufficient documentation

## 2017-10-02 DIAGNOSIS — C109 Malignant neoplasm of oropharynx, unspecified: Secondary | ICD-10-CM | POA: Insufficient documentation

## 2017-10-02 DIAGNOSIS — I10 Essential (primary) hypertension: Secondary | ICD-10-CM | POA: Insufficient documentation

## 2017-10-02 DIAGNOSIS — I2510 Atherosclerotic heart disease of native coronary artery without angina pectoris: Secondary | ICD-10-CM | POA: Insufficient documentation

## 2017-10-02 DIAGNOSIS — E1165 Type 2 diabetes mellitus with hyperglycemia: Secondary | ICD-10-CM | POA: Insufficient documentation

## 2017-10-02 DIAGNOSIS — C090 Malignant neoplasm of tonsillar fossa: Secondary | ICD-10-CM | POA: Diagnosis not present

## 2017-10-02 DIAGNOSIS — F419 Anxiety disorder, unspecified: Secondary | ICD-10-CM | POA: Insufficient documentation

## 2017-10-02 DIAGNOSIS — E878 Other disorders of electrolyte and fluid balance, not elsewhere classified: Secondary | ICD-10-CM | POA: Insufficient documentation

## 2017-10-02 DIAGNOSIS — R5383 Other fatigue: Secondary | ICD-10-CM | POA: Insufficient documentation

## 2017-10-02 DIAGNOSIS — N281 Cyst of kidney, acquired: Secondary | ICD-10-CM | POA: Insufficient documentation

## 2017-10-02 DIAGNOSIS — R110 Nausea: Secondary | ICD-10-CM | POA: Insufficient documentation

## 2017-10-02 DIAGNOSIS — K1230 Oral mucositis (ulcerative), unspecified: Secondary | ICD-10-CM | POA: Insufficient documentation

## 2017-10-02 DIAGNOSIS — E871 Hypo-osmolality and hyponatremia: Secondary | ICD-10-CM | POA: Insufficient documentation

## 2017-10-02 DIAGNOSIS — R197 Diarrhea, unspecified: Secondary | ICD-10-CM | POA: Insufficient documentation

## 2017-10-02 DIAGNOSIS — F329 Major depressive disorder, single episode, unspecified: Secondary | ICD-10-CM | POA: Insufficient documentation

## 2017-10-02 DIAGNOSIS — E119 Type 2 diabetes mellitus without complications: Secondary | ICD-10-CM | POA: Insufficient documentation

## 2017-10-02 DIAGNOSIS — R634 Abnormal weight loss: Secondary | ICD-10-CM | POA: Insufficient documentation

## 2017-10-02 DIAGNOSIS — G8929 Other chronic pain: Secondary | ICD-10-CM | POA: Insufficient documentation

## 2017-10-02 DIAGNOSIS — Z87891 Personal history of nicotine dependence: Secondary | ICD-10-CM | POA: Insufficient documentation

## 2017-10-02 DIAGNOSIS — R531 Weakness: Secondary | ICD-10-CM | POA: Insufficient documentation

## 2017-10-02 DIAGNOSIS — E039 Hypothyroidism, unspecified: Secondary | ICD-10-CM | POA: Insufficient documentation

## 2017-10-02 NOTE — Progress Notes (Signed)
Nutrition  Patient was a no show for nutrition appointment today prior to radiation appointment.  Will plan to see patient on 11/5 after radiation appointment.  Adriahna Shearman B. Zenia Resides, [place], [place] Registered Dietitian [phone] (pager)

## 2017-10-03 ENCOUNTER — Ambulatory Visit
Admission: RE | Admit: 2017-10-03 | Discharge: 2017-10-03 | Disposition: A | Discharge status: Home / Self Care | Payer: Federal, State, Local not specified - PPO | Source: Ambulatory Visit | Attending: Radiation Oncology | Admitting: Radiation Oncology

## 2017-10-03 DIAGNOSIS — C090 Malignant neoplasm of tonsillar fossa: Secondary | ICD-10-CM | POA: Diagnosis not present

## 2017-10-06 ENCOUNTER — Ambulatory Visit
Admission: RE | Admit: 2017-10-06 | Discharge: 2017-10-06 | Disposition: A | Discharge status: Home / Self Care | Payer: Federal, State, Local not specified - PPO | Source: Ambulatory Visit | Attending: Radiation Oncology | Admitting: Radiation Oncology

## 2017-10-06 ENCOUNTER — Inpatient Hospital Stay: Payer: Federal, State, Local not specified - PPO

## 2017-10-06 DIAGNOSIS — C090 Malignant neoplasm of tonsillar fossa: Secondary | ICD-10-CM | POA: Diagnosis not present

## 2017-10-06 NOTE — Progress Notes (Addendum)
Nutrition Assessment   Reason for Assessment:   Head and Neck cancer patient  ASSESSMENT:  61 year old male with diagnosis of squamous cell carcinoma of oropharynx (right tonsil stage I) HPV positive.  Past medical history of hemicolectomy due to colonic polyp (no malignancy) in May 2018, DM, GERD, HTN, neuromuscular disorder requiring intrathecal pump for chronic back pain, depression.    Met with patient and wife following radiation therapy today.  Patient reports appetite has been normal prior to starting chemotherapy last week.  Reports that over the weekend nausea, fatigue which decreased appetite.  Reports that he did take nausea medication with some relief.  Reports that yesterday ate cheese toast, 2 bowls of homemade stew, snacked on cheese and crackers and ate sweet potato pie made with splenda, also had chicken nuggets.  Has been drinking glucerna shakes but not daily.   Patient also reports some diarrhea that he took imodium for and this has resolved. Patient reports scratch throat but still able to swallow foods without difficulty.    Nutrition Focused Physical Exam: deferred  Medications: Vit D, decadron, nexium, glucovance, lantus, krill oil, ativan, niacin, zofran, compazine, Vit E, zinc gluconate  Labs: Na 133, glucose 252, BUN 24, creatinine WNL  Anthropometrics:   Height: 69.5 inches Weight: 236 lb 9 oz UBW:  Patient reports up and down 230-240s.  Reports stable weight prior to diagnosis BMI: 34   Estimated Energy Needs  Kcals: 2300-2675 calories/d Protein: 110-146 g/d Fluid: 2.6 L/d  NUTRITION DIAGNOSIS: Predicted suboptimal energy intake related to cancer treatments as evidenced by receiving chemotherapy and radiation therapy   MALNUTRITION DIAGNOSIS: continue to monitor   INTERVENTION:   Discussed strategies to increase calories and protein. Provided patient with handout of soft moist protein foods. Encouraged small frequent meals. Encouraged patient  to take nausea medication to help relieve symptoms. Encouraged patient to call clinic if medications not working. Encouraged patient to check blood glucose on regular basis as receiving steroids and appetite is up and down.  Recommend patient call MD if blood glucose numbers out of range and have medications adjusted.   Discussed oral nutrition supplements and provided samples and coupons. Recommend drinking 1-2 glucerna/premier protein shakes daily.       MONITORING, EVALUATION, GOAL: Patient will increase calories and protein to maintain weight during treatment   NEXT VISIT: November 15 following radiation treatment   B. , RD, LDN Registered Dietitian [phone] (pager)       

## 2017-10-07 ENCOUNTER — Ambulatory Visit
Admission: RE | Admit: 2017-10-07 | Discharge: 2017-10-07 | Disposition: A | Discharge status: Home / Self Care | Payer: Federal, State, Local not specified - PPO | Source: Ambulatory Visit | Attending: Radiation Oncology | Admitting: Radiation Oncology

## 2017-10-07 ENCOUNTER — Inpatient Hospital Stay (HOSPITAL_BASED_OUTPATIENT_CLINIC_OR_DEPARTMENT_OTHER): Payer: Federal, State, Local not specified - PPO | Admitting: Oncology

## 2017-10-07 ENCOUNTER — Ambulatory Visit: Payer: Federal, State, Local not specified - PPO

## 2017-10-07 ENCOUNTER — Inpatient Hospital Stay: Payer: Federal, State, Local not specified - PPO

## 2017-10-07 ENCOUNTER — Other Ambulatory Visit: Payer: Self-pay | Admitting: Oncology

## 2017-10-07 ENCOUNTER — Encounter: Payer: Self-pay | Admitting: Oncology

## 2017-10-07 ENCOUNTER — Other Ambulatory Visit: Payer: Self-pay | Admitting: *Deleted

## 2017-10-07 VITALS — BP 99/67 | HR 93 | Temp 97.7°F | Resp 14 | Wt 223.0 lb

## 2017-10-07 DIAGNOSIS — E878 Other disorders of electrolyte and fluid balance, not elsewhere classified: Secondary | ICD-10-CM

## 2017-10-07 DIAGNOSIS — F329 Major depressive disorder, single episode, unspecified: Secondary | ICD-10-CM

## 2017-10-07 DIAGNOSIS — T451X5A Adverse effect of antineoplastic and immunosuppressive drugs, initial encounter: Secondary | ICD-10-CM

## 2017-10-07 DIAGNOSIS — K760 Fatty (change of) liver, not elsewhere classified: Secondary | ICD-10-CM | POA: Diagnosis not present

## 2017-10-07 DIAGNOSIS — I2510 Atherosclerotic heart disease of native coronary artery without angina pectoris: Secondary | ICD-10-CM

## 2017-10-07 DIAGNOSIS — E860 Dehydration: Secondary | ICD-10-CM

## 2017-10-07 DIAGNOSIS — R5383 Other fatigue: Secondary | ICD-10-CM | POA: Diagnosis not present

## 2017-10-07 DIAGNOSIS — R197 Diarrhea, unspecified: Secondary | ICD-10-CM | POA: Diagnosis not present

## 2017-10-07 DIAGNOSIS — E039 Hypothyroidism, unspecified: Secondary | ICD-10-CM | POA: Diagnosis not present

## 2017-10-07 DIAGNOSIS — K1230 Oral mucositis (ulcerative), unspecified: Secondary | ICD-10-CM | POA: Diagnosis not present

## 2017-10-07 DIAGNOSIS — I480 Paroxysmal atrial fibrillation: Secondary | ICD-10-CM | POA: Diagnosis not present

## 2017-10-07 DIAGNOSIS — R531 Weakness: Secondary | ICD-10-CM | POA: Diagnosis not present

## 2017-10-07 DIAGNOSIS — Z5111 Encounter for antineoplastic chemotherapy: Secondary | ICD-10-CM

## 2017-10-07 DIAGNOSIS — C109 Malignant neoplasm of oropharynx, unspecified: Secondary | ICD-10-CM

## 2017-10-07 DIAGNOSIS — E119 Type 2 diabetes mellitus without complications: Secondary | ICD-10-CM

## 2017-10-07 DIAGNOSIS — R634 Abnormal weight loss: Secondary | ICD-10-CM | POA: Diagnosis not present

## 2017-10-07 DIAGNOSIS — E871 Hypo-osmolality and hyponatremia: Secondary | ICD-10-CM

## 2017-10-07 DIAGNOSIS — K219 Gastro-esophageal reflux disease without esophagitis: Secondary | ICD-10-CM | POA: Diagnosis not present

## 2017-10-07 DIAGNOSIS — G8929 Other chronic pain: Secondary | ICD-10-CM | POA: Diagnosis not present

## 2017-10-07 DIAGNOSIS — N281 Cyst of kidney, acquired: Secondary | ICD-10-CM | POA: Diagnosis not present

## 2017-10-07 DIAGNOSIS — R110 Nausea: Secondary | ICD-10-CM | POA: Diagnosis not present

## 2017-10-07 DIAGNOSIS — I10 Essential (primary) hypertension: Secondary | ICD-10-CM

## 2017-10-07 DIAGNOSIS — Z87891 Personal history of nicotine dependence: Secondary | ICD-10-CM

## 2017-10-07 DIAGNOSIS — R112 Nausea with vomiting, unspecified: Secondary | ICD-10-CM

## 2017-10-07 DIAGNOSIS — F419 Anxiety disorder, unspecified: Secondary | ICD-10-CM | POA: Diagnosis not present

## 2017-10-07 DIAGNOSIS — E1165 Type 2 diabetes mellitus with hyperglycemia: Secondary | ICD-10-CM | POA: Diagnosis not present

## 2017-10-07 DIAGNOSIS — Z86010 Personal history of colonic polyps: Secondary | ICD-10-CM

## 2017-10-07 DIAGNOSIS — C090 Malignant neoplasm of tonsillar fossa: Secondary | ICD-10-CM | POA: Diagnosis not present

## 2017-10-07 DIAGNOSIS — N179 Acute kidney failure, unspecified: Secondary | ICD-10-CM

## 2017-10-07 LAB — BASIC METABOLIC PANEL
Anion gap: 12 (ref 5–15)
BUN: 37 mg/dL — ABNORMAL HIGH (ref 6–20)
CO2: 23 mmol/L (ref 22–32)
Calcium: 9.1 mg/dL (ref 8.9–10.3)
Chloride: 94 mmol/L — ABNORMAL LOW (ref 101–111)
Creatinine, Ser: 1.33 mg/dL — ABNORMAL HIGH (ref 0.61–1.24)
GFR calc Af Amer: >60 mL/min (ref >60)
GFR calc non Af Amer: 56 mL/min — ABNORMAL LOW (ref >60)
Glucose, Bld: 313 mg/dL — ABNORMAL HIGH (ref 65–99)
Potassium: 3.9 mmol/L (ref 3.5–5.1)
Sodium: 129 mmol/L — ABNORMAL LOW (ref 135–145)

## 2017-10-07 LAB — CBC WITH DIFFERENTIAL/PLATELET
Basophils Absolute: 0.1 10*3/uL (ref 0–0.1)
Basophils Relative: 1 %
Eosinophils Absolute: 0.2 10*3/uL (ref 0–0.7)
Eosinophils Relative: 2 %
HCT: 46.3 % (ref 40.0–52.0)
Hemoglobin: 15.7 g/dL (ref 13.0–18.0)
Lymphocytes Relative: 17 %
Lymphs Abs: 2.5 10*3/uL (ref 1.0–3.6)
MCH: 28.3 pg (ref 26.0–34.0)
MCHC: 34 g/dL (ref 32.0–36.0)
MCV: 83.4 fL (ref 80.0–100.0)
Monocytes Absolute: 1.1 10*3/uL — ABNORMAL HIGH (ref 0.2–1.0)
Monocytes Relative: 8 %
Neutro Abs: 10.2 10*3/uL — ABNORMAL HIGH (ref 1.4–6.5)
Neutrophils Relative %: 72 %
Platelets: 258 10*3/uL (ref 150–440)
RBC: 5.55 MIL/uL (ref 4.40–5.90)
RDW: 13.7 % (ref 11.5–14.5)
WBC: 14.1 10*3/uL — ABNORMAL HIGH (ref 3.8–10.6)

## 2017-10-07 MED ORDER — SODIUM CHLORIDE 0.9 % IV SOLN
Freq: Once | INTRAVENOUS | Status: AC
  Administered 2017-10-07: 10:00:00 via INTRAVENOUS
  Filled 2017-10-07: qty 1000

## 2017-10-07 MED ORDER — HEPARIN SOD (PORK) LOCK FLUSH 100 UNIT/ML IV SOLN
500.0000 [IU] | Freq: Once | INTRAVENOUS | Status: AC | PRN
  Administered 2017-10-07: 500 [IU]
  Filled 2017-10-07: qty 5

## 2017-10-07 MED ORDER — SODIUM CHLORIDE 0.9 % IV SOLN
Freq: Once | INTRAVENOUS | Status: AC
  Administered 2017-10-07: 13:00:00 via INTRAVENOUS
  Filled 2017-10-07: qty 5

## 2017-10-07 MED ORDER — SODIUM CHLORIDE 0.9% FLUSH
10.0000 mL | INTRAVENOUS | Status: DC | PRN
  Administered 2017-10-07: 10 mL via INTRAVENOUS
  Filled 2017-10-07: qty 10

## 2017-10-07 MED ORDER — OLANZAPINE 10 MG PO TABS: 10.0000 mg | ORAL_TABLET | Freq: Every day | ORAL | 0 refills | Status: DC

## 2017-10-07 MED ORDER — PALONOSETRON HCL INJECTION 0.25 MG/5ML
0.2500 mg | Freq: Once | INTRAVENOUS | Status: AC
  Administered 2017-10-07: 0.25 mg via INTRAVENOUS
  Filled 2017-10-07: qty 5

## 2017-10-07 MED ORDER — HEPARIN SOD (PORK) LOCK FLUSH 100 UNIT/ML IV SOLN: 500.0000 [IU] | Freq: Once | INTRAVENOUS | Status: AC

## 2017-10-07 MED ORDER — SODIUM CHLORIDE 0.9 % IV SOLN
40.0000 mg/m2 | Freq: Once | INTRAVENOUS | Status: AC
  Administered 2017-10-07: 92 mg via INTRAVENOUS
  Filled 2017-10-07: qty 92

## 2017-10-07 MED ORDER — DEXTROSE-NACL 5-0.45 % IV SOLN
Freq: Once | INTRAVENOUS | Status: AC
  Administered 2017-10-07: 10:00:00 via INTRAVENOUS
  Filled 2017-10-07: qty 1000

## 2017-10-07 NOTE — Progress Notes (Signed)
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 Patient is a 61 year old male who was found to have a right neck mass about 1 year ago when he underwent morphine pump placement. At that time he was seen by ENT and also went through antibiotic course but was not found to have a malignancy back then. Patient recently underwent a hemicolectomy by Dr. Jamal Collin as he was found to have a colonic polyp which was not resectable on colonoscopy. Shortly following the surgery patient again noticed right sided neck mass. He wasttreated with empiric antibiotics by Dr. Doy Hutching but the mass did not go away and he was referred to Dr. Tami Ribas. Patient was found to have tonsillar asymmetry on NPL exam and underwent CT neck  2. CT soft tissue of the neck. Multiple right-sided lymph nodes at level IIA and 2B between 1.5-1.8 cm in size which were suspicious in appearance.also noted to have started asymmetric soft tissue fullness in the oropharynx or on the right lateral wall in the right palatine tonsil region measuring up to 2.7 cm.  3. 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 This was followed by a PET CT scan which showed right palatine tonsillar mass with a maximum SUV of 8.5 compatible with  malignancy. Several right level IIA lymph nodes are present and are hypermetabolic, larger node measuring 1.6 cm in short axis. No evidence of metastatic spread to the chest abdomen and pelvis or visualized skeleton  5. Patient has been referred to [place] for further management. Currently patient reports some pain and discomfort in his right neck. Denies any difficulty swallowing. His appetite is good and he does not report any unintentional weight loss. He does have chronic pain for which she has a morphine pump in place    Interval history- patient has been experiencing some nausea 3-4 days after chemo. He has tried prn compazine but has not helped much. Reports some loose stools for last 2 days. He has been trying to keep up with PO intake. Neck swelling much decreased. He has lost 13 pounds of weight in the last 1 week  ECOG PS- 0 Pain scale- 0 Opioid associated constipation- no  Review of systems- Review of Systems  Constitutional: Positive for malaise/fatigue.  Gastrointestinal: Positive for diarrhea and nausea.      Allergies  Allergen Reactions  . Biaxin [Clarithromycin] Other (See Comments)    Severe hiccups  . Ceftin [Cefuroxime Axetil] Other (See Comments)    Severe hiccups  . Penicillins Hives    Has patient had a PCN reaction causing immediate rash, facial/tongue/throat swelling, SOB or lightheadedness with hypotension: No Has patient had a PCN reaction causing severe rash involving mucus membranes or skin necrosis: unknown Has patient had a PCN reaction that required hospitalization: No Has patient had a PCN reaction occurring within the last 10 years: No If all of the above  answers are "NO", then may proceed with Cephalosporin use.      Past Medical History:  Diagnosis Date  . Anxiety   . Arthritis   . Cancer ([place]) 08/21/2017  . Chronic pain 1996   due to work injury  . Colon polyp    adenomatous polyp ascending colon  . Cough    THAT WONT GO AWAY-PT TO SEE DR  Doy Hutching ON 06-10-17 @ 4 PM  . Depression   . Diabetes mellitus without complication ([place])   . Dysrhythmia    AFIB X 1  . GERD (gastroesophageal reflux disease)   . Herniated lumbar intervertebral disc   . Hypertension   . Hypothyroidism   . Neck pain   . Neuromuscular disorder ([place])   . Presence of intrathecal pump    Dr. Mechele Dawley in [place], [place]     Past Surgical History:  Procedure Laterality Date  . COLONOSCOPY  2013   [place]  . IMAGE GUIDED SINUS SURGERY    . implanted morphine pump  2016  . IR FLUORO GUIDE PORT INSERTION LEFT  09/26/2017  . LAPAROSCOPIC CHOLECYSTECTOMY  2013  . UMBILICAL HERNIA REPAIR  2013  . VASECTOMY      Social History   Socioeconomic History  . Marital status: Married    Spouse name: Not on file  . Number of children: Not on file  . Years of education: Not on file  . Highest education level: Not on file  Social Needs  . Financial resource strain: Not on file  . Food insecurity - worry: Not on file  . Food insecurity - inability: Not on file  . Transportation needs - medical: Not on file  . Transportation needs - non-medical: Not on file  Occupational History  . Not on file  Tobacco Use  . Smoking status: Former Smoker    Years: 20.00    Types: Cigarettes    Last attempt to quit: 12/03/1999    Years since quitting: 17.8  . Smokeless tobacco: Never Used  Substance and Sexual Activity  . Alcohol use: No  . Drug use: No  . Sexual activity: Yes  Other Topics Concern  . Not on file  Social History Narrative  . Not on file    Family History  Problem Relation Age of Onset  . Breast cancer Cousin 44  . Cancer - Colon Neg Hx   . Prostate cancer Neg Hx      Current Outpatient Medications:  .  amitriptyline (ELAVIL) 100 MG tablet, Take 50 mg by mouth at bedtime as needed for sleep. , Disp: , Rfl:  .  Cholecalciferol (VITAMIN D3) 5000 units TABS, Take 5,000 Units by mouth daily. , Disp: , Rfl:  .  citalopram (CELEXA)  40 MG tablet, Take 40 mg by mouth every morning. , Disp: , Rfl:  .  dexamethasone (DECADRON) 4 MG tablet, Take 2 tablets by mouth once a day on the day after chemotherapy and then take 2 tablets two times a day for 2 days. Take with food., Disp: 30 tablet, Rfl: 1 .  esomeprazole (NEXIUM) 40 MG capsule, Take 40 mg by mouth daily as needed. , Disp: , Rfl:  .  glyBURIDE-metformin (GLUCOVANCE) 5-500 MG tablet, Take 0.5 tablets by mouth See admin instructions. Takes 0.5 tablet twice daily in morning and bedtime and takes a 3rd dose only if needed for elevated blood sugar, Disp: , Rfl:  .  HYDROcodone-acetaminophen (NORCO) 7.5-325 MG tablet, Take 1 tablet by mouth  every 6 (six) hours as needed for moderate pain. , Disp: , Rfl:  .  Insulin Glargine (BASAGLAR KWIKPEN) 100 UNIT/ML SOPN, Inject 40 Units into the skin daily before breakfast. , Disp: , Rfl:  .  KRILL OIL PO, Take 1 capsule by mouth daily. , Disp: , Rfl:  .  lidocaine-prilocaine (EMLA) cream, Apply to affected area once, Disp: 30 g, Rfl: 3 .  lisinopril-hydrochlorothiazide (PRINZIDE,ZESTORETIC) 20-25 MG tablet, Take 1 tablet by mouth daily. , Disp: , Rfl:  .  LORazepam (ATIVAN) 0.5 MG tablet, Take 1 tablet (0.5 mg total) by mouth every 6 (six) hours as needed (Nausea or vomiting)., Disp: 30 tablet, Rfl: 0 .  MORPHINE SULFATE IJ, Inject as directed. Morphine 4mg /ml epidural pump. 1.4897 mg/day, Disp: , Rfl:  .  niacin 500 MG tablet, Take 500 mg by mouth at bedtime., Disp: , Rfl:  .  ondansetron (ZOFRAN) 8 MG tablet, Take 1 tablet (8 mg total) by mouth 2 (two) times daily as needed. Start on the third day after chemotherapy., Disp: 30 tablet, Rfl: 1 .  oxymetazoline (AFRIN) 0.05 % nasal spray, Place 1 spray into both nostrils 2 (two) times daily as needed for congestion., Disp: , Rfl:  .  pregabalin (LYRICA) 150 MG capsule, Take 150 mg by mouth 2 (two) times daily., Disp: , Rfl:  .  prochlorperazine (COMPAZINE) 10 MG tablet, Take 1 tablet (10 mg  total) by mouth every 6 (six) hours as needed (Nausea or vomiting)., Disp: 30 tablet, Rfl: 1 .  thyroid (ARMOUR) 32.5 MG tablet, Take 32.5 mg by mouth every morning. , Disp: , Rfl:  .  tiZANidine (ZANAFLEX) 4 MG tablet, Take 4 mg by mouth every 8 (eight) hours as needed for muscle spasms., Disp: , Rfl:  .  vitamin E (VITAMIN E) 400 UNIT capsule, Take 400 Units by mouth daily., Disp: , Rfl:  .  zinc gluconate 50 MG tablet, Take 50 mg by mouth daily., Disp: , Rfl:   Physical exam:  Vitals:   10/07/17 0918  BP: 99/67  Pulse: 93  Resp: 14  Temp: 97.7 F (36.5 C)  TempSrc: Tympanic  Weight: 223 lb (101.2 kg)   Physical Exam  Constitutional: He is oriented to person, place, and time and well-developed, well-nourished, and in no distress.  HENT:  Head: Normocephalic and atraumatic.  Mouth/Throat: Oropharynx is clear and moist.  Right neck adenopathy significantly smaller in size  Eyes: EOM are normal. Pupils are equal, round, and reactive to light.  Neck: Normal range of motion.  Cardiovascular: Normal rate, regular rhythm and normal heart sounds.  Pulmonary/Chest: Effort normal and breath sounds normal.  Abdominal: Soft. Bowel sounds are normal.  Neurological: He is alert and oriented to person, place, and time.  Skin: Skin is warm and dry.     CMP Latest Ref Rng & Units 10/07/2017  Glucose 65 - 99 mg/dL 313(H)  BUN 6 - 20 mg/dL 37(H)  Creatinine 0.61 - 1.24 mg/dL 1.33(H)  Sodium 135 - 145 mmol/L 129(L)  Potassium 3.5 - 5.1 mmol/L 3.9  Chloride 101 - 111 mmol/L 94(L)  CO2 22 - 32 mmol/L 23  Calcium 8.9 - 10.3 mg/dL 9.1   CBC Latest Ref Rng & Units 10/07/2017  WBC 3.8 - 10.6 K/uL 14.1(H)  Hemoglobin 13.0 - 18.0 g/dL 15.7  Hematocrit 40.0 - 52.0 % 46.3  Platelets 150 - 440 K/uL 258    No images are attached to the encounter.  Nm Pet Image Initial (pi) Skull Base  To Thigh  Result Date: 09/10/2017 CLINICAL DATA:  Initial treatment strategy for squamous cell carcinoma of the  right oropharynx with associated nodal disease. EXAM: NUCLEAR MEDICINE PET SKULL BASE TO THIGH TECHNIQUE: 12.4 mCi F-18 FDG was injected intravenously. Full-ring PET imaging was performed from the skull base to thigh after the radiotracer. CT data was obtained and used for attenuation correction and anatomic localization. FASTING BLOOD GLUCOSE:  Value: 176 mg/dl COMPARISON:  CT neck 08/12/2017 FINDINGS: NECK Right palatine tonsillar/oropharyngeal mass seen on recent neck CT has a maximum SUV of 8.5, compatible with malignancy. Right level IIa lymph nodes are present, with the larger node measuring 1.6 cm in short axis on image 40/3 with maximum SUV 4.9, compatible with malignant involvement. CHEST No hypermetabolic mediastinal or hilar nodes. No suspicious pulmonary nodules on the CT data. Coronary, aortic arch, and branch vessel atherosclerotic vascular disease. Small mediastinal lymph nodes are not pathologically enlarged by size criteria. Mild gynecomastia. ABDOMEN/PELVIS No abnormal hypermetabolic activity within the liver, pancreas, adrenal glands, or spleen. No hypermetabolic lymph nodes in the abdomen or pelvis. Diffuse hepatic steatosis. Cholecystectomy. Photopenic left kidney upper pole cyst. 1.1 cm peripancreatic lymph node on image 142/3, without associated hypermetabolic activity. Right hemicolectomy. Aortoiliac atherosclerotic vascular disease. Subcutaneous left lower quadrant pump device, discontinuous intrathecal of portion of the catheter. SKELETON No focal hypermetabolic activity to suggest skeletal metastasis. IMPRESSION: 1. Right palatine tonsillar mass maximum SUV 8.5 compatible with malignancy. Several right level IIa lymph nodes are present and are hypermetabolic, larger node measuring 1.6 cm in short axis. 2. No evidence of metastatic spread to the chest, abdomen/pelvis, or visualized skeleton. 3. The patient has a subcutaneous left lower quadrant pump device. Catheter related to this device  the enters the thecal sac at approximately the L2- 3 level and extends cephalad in the anterior midline. There appears to be a separate fragment of catheter extending caudad from the L2- 3 level down to the upper sacral level. 4. Other imaging findings of potential clinical significance: Aortic Atherosclerosis (ICD10-I70.0). Coronary atherosclerosis. Diffuse hepatic steatosis. Prior right hemicolectomy. Electronically Signed   By: Van Clines M.D.   On: 09/10/2017 09:55   Ir Fluoro Guide Port Insertion Left  Result Date: 09/26/2017 INDICATION: 61 year old with squamous cell carcinoma of the neck. Port-A-Cath needed for chemotherapy. EXAM: FLUOROSCOPIC AND ULTRASOUND GUIDED PLACEMENT OF A SUBCUTANEOUS PORT. Physician: Stephan Minister. Anselm Pancoast, MD MEDICATIONS: Vancomycin 1 gm IV; As antibiotic prophylaxis, Ancef 1 gm was ordered pre-procedure and administered intravenously within one hour of incision. ANESTHESIA/SEDATION: Versed 3.0 mg IV; Fentanyl 75 mcg IV; Moderate Sedation Time:  33 minutes The patient was continuously monitored during the procedure by the interventional radiology nurse under my direct supervision. FLUOROSCOPY TIME:  36 seconds, 9 mGy COMPLICATIONS: None immediate. PROCEDURE: The risks of the procedure were explained to the patient. Informed consent was obtained. Patient was placed supine on the interventional table. Right cervical lymphadenopathy was evaluated with ultrasound. Based on the location of the lymphadenopathy and upcoming radiation, decided to evaluate the left side of the neck for port placement. Ultrasound confirmed a patent left internal jugular vein. The left chest and neck were cleaned with a skin antiseptic and a sterile drape was placed. Maximal barrier sterile technique was utilized including caps, mask, sterile gowns, sterile gloves, sterile drape, hand hygiene and skin antiseptic. The left neck was anesthetized with 1% lidocaine. Small incision was made in the left neck with  a blade. Micropuncture set was placed in the left IJ with  ultrasound guidance. The micropuncture wire was used for measurement purposes. The left chest was anesthetized with 1% lidocaine with epinephrine. #15 blade was used to make an incision and a subcutaneous port pocket was formed. [place] was assembled. Subcutaneous tunnel was formed with a stiff tunneling device. The port catheter was brought through the subcutaneous tunnel. The port was placed in the subcutaneous pocket. The micropuncture set was exchanged for a peel-away sheath. The catheter was placed through the peel-away sheath and the tip was positioned at the superior cavoatrial junction. Catheter placement was confirmed with fluoroscopy. The port was accessed and flushed with heparinized saline. The port pocket was closed using two layers of absorbable sutures and Dermabond. The vein skin site was closed using a single layer of absorbable suture and Dermabond. Sterile dressings were applied. Patient tolerated the procedure well without an immediate complication. Ultrasound and fluoroscopic images were taken and saved for this procedure. IMPRESSION: Placement of a subcutaneous port device. The catheter tip at the superior cavoatrial junction and ready to be used. Electronically Signed   By: Markus Daft M.D.   On: 09/26/2017 14:50     Assessment and plan- Patient is a 61 y.o. male  with SCC of oropharynx Stage I T2N1M0 here for on treatment assessment prior to cycle 2 of weekly cisplatin/RT  Counts ok to proceed with cycle 2 of weekly cisplatin along with RT. He will directly proceed for cycle 3 of cisplatin next week (labs- cbc, cmp then chemo) and I will see him back prior to cycle 4 of weekly cisplatin. Depending on his kidney functions, we will need to adjust further doses of cisplatin  AKI- likely due to inadequate PO intake/ diarrhea and cisplatin- will give 1L NS today and 2 more days. Repeat BMP on 10/09/17  Hyponatremia/  hypochloremia- will give additional fluids today and 2 more days  Weight loss- he has lost 13 pounds of weight in 1 week. Encouraged him to use protein shakes and keep up with oral intake. Continue to monitor  Chemo induced nausea/ vomiting- will add zyprexa 10 mg QHS. He will use either PRN compazine or zofran in addition. Baseline EKG from July 2018 showed normal QTc   Visit Diagnosis 1. Oropharyngeal cancer ([place])   2. Encounter for antineoplastic chemotherapy   3. Chemotherapy induced nausea and vomiting   4. Hyponatremia   5. Weight loss   6. AKI (acute kidney injury) ([place])      Dr. Randa Evens, MD, MPH [facility] Pager- [phone] 10/07/2017 9:47 AM

## 2017-10-07 NOTE — Progress Notes (Signed)
Patient here today for follow up with labs and treatment. He states that he is very nauseated and has been since Saturday. He is also having hot flashes and sweating. He feels weak and tired with absolutely no energy.

## 2017-10-08 ENCOUNTER — Inpatient Hospital Stay: Payer: Federal, State, Local not specified - PPO

## 2017-10-08 ENCOUNTER — Ambulatory Visit
Admission: RE | Admit: 2017-10-08 | Discharge: 2017-10-08 | Disposition: A | Discharge status: Home / Self Care | Payer: Federal, State, Local not specified - PPO | Source: Ambulatory Visit | Attending: Radiation Oncology | Admitting: Radiation Oncology

## 2017-10-08 VITALS — BP 151/83 | HR 76 | Temp 96.5°F | Resp 20

## 2017-10-08 DIAGNOSIS — C109 Malignant neoplasm of oropharynx, unspecified: Secondary | ICD-10-CM

## 2017-10-08 DIAGNOSIS — C090 Malignant neoplasm of tonsillar fossa: Secondary | ICD-10-CM | POA: Diagnosis not present

## 2017-10-08 MED ORDER — SODIUM CHLORIDE 0.9% FLUSH
10.0000 mL | Freq: Once | INTRAVENOUS | Status: DC
  Filled 2017-10-08: qty 10

## 2017-10-08 MED ORDER — SODIUM CHLORIDE 0.9 % IV SOLN
Freq: Once | INTRAVENOUS | Status: AC
  Administered 2017-10-08: 999 mL via INTRAVENOUS
  Filled 2017-10-08: qty 1000

## 2017-10-08 MED ORDER — HEPARIN SOD (PORK) LOCK FLUSH 100 UNIT/ML IV SOLN
500.0000 [IU] | Freq: Once | INTRAVENOUS | Status: AC
  Administered 2017-10-08: 500 [IU] via INTRAVENOUS
  Filled 2017-10-08: qty 5

## 2017-10-09 ENCOUNTER — Telehealth: Payer: Self-pay | Admitting: *Deleted

## 2017-10-09 ENCOUNTER — Inpatient Hospital Stay: Payer: Federal, State, Local not specified - PPO

## 2017-10-09 ENCOUNTER — Other Ambulatory Visit: Payer: Self-pay | Admitting: *Deleted

## 2017-10-09 ENCOUNTER — Other Ambulatory Visit: Payer: Self-pay | Admitting: Oncology

## 2017-10-09 ENCOUNTER — Ambulatory Visit
Admission: RE | Admit: 2017-10-09 | Discharge: 2017-10-09 | Disposition: A | Discharge status: Home / Self Care | Payer: Federal, State, Local not specified - PPO | Source: Ambulatory Visit | Attending: Radiation Oncology | Admitting: Radiation Oncology

## 2017-10-09 VITALS — BP 131/77 | HR 88 | Temp 98.3°F | Resp 20

## 2017-10-09 DIAGNOSIS — C109 Malignant neoplasm of oropharynx, unspecified: Secondary | ICD-10-CM

## 2017-10-09 DIAGNOSIS — C090 Malignant neoplasm of tonsillar fossa: Secondary | ICD-10-CM | POA: Diagnosis not present

## 2017-10-09 MED ORDER — SODIUM CHLORIDE 0.9% FLUSH
10.0000 mL | INTRAVENOUS | Status: DC | PRN
  Administered 2017-10-09: 10 mL via INTRAVENOUS
  Filled 2017-10-09: qty 10

## 2017-10-09 MED ORDER — SODIUM CHLORIDE 0.9 % IV SOLN
Freq: Once | INTRAVENOUS | Status: AC
  Administered 2017-10-09: 14:00:00 via INTRAVENOUS
  Filled 2017-10-09: qty 1000

## 2017-10-09 MED ORDER — HEPARIN SOD (PORK) LOCK FLUSH 100 UNIT/ML IV SOLN
500.0000 [IU] | Freq: Once | INTRAVENOUS | Status: AC
  Administered 2017-10-09: 500 [IU] via INTRAVENOUS

## 2017-10-09 MED ORDER — BACLOFEN 5 MG PO TABS: 5.0000 mg | ORAL_TABLET | Freq: Three times a day (TID) | ORAL | 0 refills | Status: DC | PRN

## 2017-10-09 NOTE — Telephone Encounter (Signed)
Called pt to let him know that he can try baclofen for hiccups. The line was busy after several attempts. I then called the wife and left a message that pt can try baclofen tid as needed for hiccups. I have sent it in to the pharmacy

## 2017-10-10 ENCOUNTER — Ambulatory Visit
Admission: RE | Admit: 2017-10-10 | Discharge: 2017-10-10 | Disposition: A | Discharge status: Home / Self Care | Payer: Federal, State, Local not specified - PPO | Source: Ambulatory Visit | Attending: Radiation Oncology | Admitting: Radiation Oncology

## 2017-10-10 ENCOUNTER — Telehealth: Payer: Self-pay | Admitting: Oncology

## 2017-10-10 ENCOUNTER — Inpatient Hospital Stay: Payer: Federal, State, Local not specified - PPO

## 2017-10-10 VITALS — BP 128/78 | HR 86 | Temp 98.1°F | Resp 18

## 2017-10-10 DIAGNOSIS — C109 Malignant neoplasm of oropharynx, unspecified: Secondary | ICD-10-CM | POA: Diagnosis not present

## 2017-10-10 DIAGNOSIS — N289 Disorder of kidney and ureter, unspecified: Secondary | ICD-10-CM

## 2017-10-10 DIAGNOSIS — C090 Malignant neoplasm of tonsillar fossa: Secondary | ICD-10-CM | POA: Diagnosis not present

## 2017-10-10 LAB — BASIC METABOLIC PANEL
Anion gap: 11 (ref 5–15)
BUN: 39 mg/dL — ABNORMAL HIGH (ref 6–20)
CO2: 22 mmol/L (ref 22–32)
Calcium: 8.5 mg/dL — ABNORMAL LOW (ref 8.9–10.3)
Chloride: 97 mmol/L — ABNORMAL LOW (ref 101–111)
Creatinine, Ser: 1.4 mg/dL — ABNORMAL HIGH (ref 0.61–1.24)
GFR calc Af Amer: >60 mL/min (ref >60)
GFR calc non Af Amer: 52 mL/min — ABNORMAL LOW (ref >60)
Glucose, Bld: 314 mg/dL — ABNORMAL HIGH (ref 65–99)
Potassium: 4.6 mmol/L (ref 3.5–5.1)
Sodium: 130 mmol/L — ABNORMAL LOW (ref 135–145)

## 2017-10-10 MED ORDER — SODIUM CHLORIDE 0.9 % IV SOLN
Freq: Once | INTRAVENOUS | Status: AC
  Administered 2017-10-10: 13:00:00 via INTRAVENOUS
  Filled 2017-10-10: qty 1000

## 2017-10-10 MED ORDER — HEPARIN SOD (PORK) LOCK FLUSH 100 UNIT/ML IV SOLN
500.0000 [IU] | Freq: Once | INTRAVENOUS | Status: AC
  Administered 2017-10-10: 500 [IU] via INTRAVENOUS

## 2017-10-10 MED ORDER — HEPARIN SOD (PORK) LOCK FLUSH 100 UNIT/ML IV SOLN
INTRAVENOUS | Status: AC
  Filled 2017-10-10: qty 5

## 2017-10-10 NOTE — Telephone Encounter (Signed)
Lab addon per Sherry/verbal. Stated that someone forgot to add/schd labs for today and that patient showed up for labs, because he knew was supposed to have labs today.  Angelica Chessman to send staff msg. Order sent via staff msg.  Lab appt added for today,per request.

## 2017-10-10 NOTE — Telephone Encounter (Signed)
Per schd msg/Sherry. Addon 1 hr IVF for today. Josem Kaufmann by Maudie Mercury, per Judeen Hammans) 1 hr IVF added as requested.   Per schd msg, Also his appt for next tues is on [place] schedule by mistake  Can you make sure he is on [facility] schedule next week and add 1 hour fluids to his infusion encounter next tues.   UPDATE Appts corrected as requested.  Per Sherry/schd msg, patient scheduled with wrong MD for 10/14/17 & to rschd with Dr Janese Banks. Ok to [facility] book @ 8:45 per Dr Janese Banks (verbal)

## 2017-10-13 ENCOUNTER — Ambulatory Visit
Admission: RE | Admit: 2017-10-13 | Discharge: 2017-10-13 | Disposition: A | Discharge status: Home / Self Care | Payer: Federal, State, Local not specified - PPO | Source: Ambulatory Visit | Attending: Radiation Oncology | Admitting: Radiation Oncology

## 2017-10-13 ENCOUNTER — Other Ambulatory Visit: Payer: Self-pay | Admitting: *Deleted

## 2017-10-13 DIAGNOSIS — C090 Malignant neoplasm of tonsillar fossa: Secondary | ICD-10-CM | POA: Diagnosis not present

## 2017-10-13 DIAGNOSIS — I480 Paroxysmal atrial fibrillation: Secondary | ICD-10-CM | POA: Insufficient documentation

## 2017-10-13 DIAGNOSIS — R0602 Shortness of breath: Secondary | ICD-10-CM | POA: Insufficient documentation

## 2017-10-13 MED ORDER — SUCRALFATE 1 G PO TABS: 1.0000 g | ORAL_TABLET | Freq: Three times a day (TID) | ORAL | 3 refills | Status: DC

## 2017-10-14 ENCOUNTER — Telehealth: Payer: Self-pay | Admitting: Oncology

## 2017-10-14 ENCOUNTER — Inpatient Hospital Stay (HOSPITAL_BASED_OUTPATIENT_CLINIC_OR_DEPARTMENT_OTHER): Payer: Federal, State, Local not specified - PPO | Admitting: Oncology

## 2017-10-14 ENCOUNTER — Encounter: Payer: Self-pay | Admitting: Oncology

## 2017-10-14 ENCOUNTER — Inpatient Hospital Stay: Payer: Federal, State, Local not specified - PPO

## 2017-10-14 ENCOUNTER — Ambulatory Visit: Payer: Federal, State, Local not specified - PPO | Admitting: Hematology and Oncology

## 2017-10-14 ENCOUNTER — Ambulatory Visit
Admission: RE | Admit: 2017-10-14 | Discharge: 2017-10-14 | Disposition: A | Discharge status: Home / Self Care | Payer: Federal, State, Local not specified - PPO | Source: Ambulatory Visit | Attending: Radiation Oncology | Admitting: Radiation Oncology

## 2017-10-14 ENCOUNTER — Telehealth: Payer: Self-pay | Admitting: *Deleted

## 2017-10-14 VITALS — BP 108/72 | HR 174 | Temp 98.2°F | Resp 15 | Wt 227.0 lb

## 2017-10-14 DIAGNOSIS — R110 Nausea: Secondary | ICD-10-CM | POA: Diagnosis not present

## 2017-10-14 DIAGNOSIS — K1230 Oral mucositis (ulcerative), unspecified: Secondary | ICD-10-CM | POA: Diagnosis not present

## 2017-10-14 DIAGNOSIS — I10 Essential (primary) hypertension: Secondary | ICD-10-CM

## 2017-10-14 DIAGNOSIS — E871 Hypo-osmolality and hyponatremia: Secondary | ICD-10-CM

## 2017-10-14 DIAGNOSIS — K760 Fatty (change of) liver, not elsewhere classified: Secondary | ICD-10-CM

## 2017-10-14 DIAGNOSIS — C090 Malignant neoplasm of tonsillar fossa: Secondary | ICD-10-CM | POA: Diagnosis not present

## 2017-10-14 DIAGNOSIS — Z95828 Presence of other vascular implants and grafts: Secondary | ICD-10-CM

## 2017-10-14 DIAGNOSIS — E119 Type 2 diabetes mellitus without complications: Secondary | ICD-10-CM

## 2017-10-14 DIAGNOSIS — E878 Other disorders of electrolyte and fluid balance, not elsewhere classified: Secondary | ICD-10-CM | POA: Diagnosis not present

## 2017-10-14 DIAGNOSIS — N179 Acute kidney failure, unspecified: Secondary | ICD-10-CM | POA: Diagnosis not present

## 2017-10-14 DIAGNOSIS — R197 Diarrhea, unspecified: Secondary | ICD-10-CM | POA: Diagnosis not present

## 2017-10-14 DIAGNOSIS — Z86010 Personal history of colonic polyps: Secondary | ICD-10-CM

## 2017-10-14 DIAGNOSIS — R634 Abnormal weight loss: Secondary | ICD-10-CM

## 2017-10-14 DIAGNOSIS — K219 Gastro-esophageal reflux disease without esophagitis: Secondary | ICD-10-CM

## 2017-10-14 DIAGNOSIS — R531 Weakness: Secondary | ICD-10-CM

## 2017-10-14 DIAGNOSIS — G8929 Other chronic pain: Secondary | ICD-10-CM | POA: Diagnosis not present

## 2017-10-14 DIAGNOSIS — Z87891 Personal history of nicotine dependence: Secondary | ICD-10-CM

## 2017-10-14 DIAGNOSIS — I4892 Unspecified atrial flutter: Secondary | ICD-10-CM | POA: Insufficient documentation

## 2017-10-14 DIAGNOSIS — C109 Malignant neoplasm of oropharynx, unspecified: Secondary | ICD-10-CM

## 2017-10-14 DIAGNOSIS — F329 Major depressive disorder, single episode, unspecified: Secondary | ICD-10-CM

## 2017-10-14 DIAGNOSIS — E1165 Type 2 diabetes mellitus with hyperglycemia: Secondary | ICD-10-CM

## 2017-10-14 DIAGNOSIS — I2510 Atherosclerotic heart disease of native coronary artery without angina pectoris: Secondary | ICD-10-CM

## 2017-10-14 DIAGNOSIS — R5383 Other fatigue: Secondary | ICD-10-CM

## 2017-10-14 DIAGNOSIS — N281 Cyst of kidney, acquired: Secondary | ICD-10-CM

## 2017-10-14 DIAGNOSIS — I480 Paroxysmal atrial fibrillation: Secondary | ICD-10-CM

## 2017-10-14 DIAGNOSIS — F419 Anxiety disorder, unspecified: Secondary | ICD-10-CM

## 2017-10-14 DIAGNOSIS — E039 Hypothyroidism, unspecified: Secondary | ICD-10-CM

## 2017-10-14 LAB — CBC WITH DIFFERENTIAL/PLATELET
Basophils Absolute: 0.1 10*3/uL (ref 0–0.1)
Basophils Relative: 1 %
Eosinophils Absolute: 0.1 10*3/uL (ref 0–0.7)
Eosinophils Relative: 1 %
HCT: 43.3 % (ref 40.0–52.0)
Hemoglobin: 14.3 g/dL (ref 13.0–18.0)
Lymphocytes Relative: 17 %
Lymphs Abs: 2.1 10*3/uL (ref 1.0–3.6)
MCH: 28.1 pg (ref 26.0–34.0)
MCHC: 33.1 g/dL (ref 32.0–36.0)
MCV: 85 fL (ref 80.0–100.0)
Monocytes Absolute: 1 10*3/uL (ref 0.2–1.0)
Monocytes Relative: 8 %
Neutro Abs: 9.2 10*3/uL — ABNORMAL HIGH (ref 1.4–6.5)
Neutrophils Relative %: 73 %
Platelets: 200 10*3/uL (ref 150–440)
RBC: 5.09 MIL/uL (ref 4.40–5.90)
RDW: 14.5 % (ref 11.5–14.5)
WBC: 12.5 10*3/uL — ABNORMAL HIGH (ref 3.8–10.6)

## 2017-10-14 LAB — BASIC METABOLIC PANEL
Anion gap: 7 (ref 5–15)
BUN: 28 mg/dL — ABNORMAL HIGH (ref 6–20)
CO2: 30 mmol/L (ref 22–32)
Calcium: 9 mg/dL (ref 8.9–10.3)
Chloride: 97 mmol/L — ABNORMAL LOW (ref 101–111)
Creatinine, Ser: 1.06 mg/dL (ref 0.61–1.24)
GFR calc Af Amer: >60 mL/min (ref >60)
GFR calc non Af Amer: >60 mL/min (ref >60)
Glucose, Bld: 377 mg/dL — ABNORMAL HIGH (ref 65–99)
Potassium: 4.5 mmol/L (ref 3.5–5.1)
Sodium: 134 mmol/L — ABNORMAL LOW (ref 135–145)

## 2017-10-14 LAB — MAGNESIUM: Magnesium: 1.7 mg/dL (ref 1.7–2.4)

## 2017-10-14 MED ORDER — HEPARIN SOD (PORK) LOCK FLUSH 100 UNIT/ML IV SOLN
500.0000 [IU] | Freq: Once | INTRAVENOUS | Status: AC
  Administered 2017-10-14: 500 [IU] via INTRAVENOUS
  Filled 2017-10-14: qty 5

## 2017-10-14 NOTE — Progress Notes (Signed)
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 Patient is a 61 year old male who was found to have a right neck mass about 1 year ago when he underwent morphine pump placement. At that time he was seen by ENT and also went through antibiotic course but was not found to have a malignancy back then. Patient recently underwent a hemicolectomy by Dr. Jamal Collin as he was found to have a colonic polyp which was not resectable on colonoscopy. Shortly following the surgery patient again noticed right sided neck mass. He wasttreated with empiric antibiotics by Dr. Doy Hutching but the mass did not go away and he was referred to Dr. Tami Ribas. Patient was found to have tonsillar asymmetry on NPL exam and underwent CT neck  2. CT soft tissue of the neck. Multiple right-sided lymph nodes at level IIA and 2B between 1.5-1.8 cm in size which were suspicious in appearance.also noted to have started asymmetric soft tissue fullness in the oropharynx or on the right lateral wall in the right palatine tonsil region measuring up to 2.7 cm.  3. 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 This was followed by a PET CT scan which showed right palatine tonsillar mass with a maximum SUV of 8.5 compatible with  malignancy. Several right level IIA lymph nodes are present and are hypermetabolic, larger node measuring 1.6 cm in short axis. No evidence of metastatic spread to the chest abdomen and pelvis or visualized skeleton  5. Patient has been referred to [place] for further management. Currently patient reports some pain and discomfort in his right neck. Denies any difficulty swallowing. His appetite is good and he does not report any unintentional weight loss. He does have chronic pain for which she has a morphine pump in place   Interval history- patient received 3 days of IVF and kidney functions have normalized. He was started on baclofen for hiccups. He was seen by Dr. Nehemiah Massed yesterday for paroxysmal atrial fibrillation and was started on Cardizem 240 mg daily.  He has taken 1 dose this morning as well but his heart rate continues to be high.  He denies any chest pain or shortness of breath.  He does feel fatigued.  Also has mucositis and pain during swallowing  ECOG PS- 1 Pain scale- 4 Opioid associated constipation- no  Review of systems- Review of Systems  Constitutional: Positive for malaise/fatigue. Negative for chills, fever and weight loss.  HENT: Positive for sore throat. Negative for congestion, ear discharge and nosebleeds.   Eyes: Negative for blurred vision.  Respiratory: Negative for cough, hemoptysis, sputum production, shortness of breath and wheezing.   Cardiovascular: Negative for chest pain, palpitations, orthopnea and claudication.  Gastrointestinal: Negative for abdominal pain, blood in stool, constipation, diarrhea, heartburn, melena, nausea and vomiting.  Genitourinary: Negative for dysuria, flank pain, frequency, hematuria and urgency.  Musculoskeletal: Negative for back pain, joint pain and myalgias.  Skin: Negative for rash.  Neurological: Positive for weakness. Negative for dizziness, tingling, focal weakness, seizures and headaches.  Endo/Heme/Allergies: Does not  bruise/bleed easily.  Psychiatric/Behavioral: Negative for depression and suicidal ideas. The patient does not have insomnia.       Allergies  Allergen Reactions  . Biaxin [Clarithromycin] Other (See Comments)    Severe hiccups  . Ceftin [Cefuroxime Axetil] Other (See Comments)    Severe hiccups  . Penicillins Hives    Has patient had a PCN reaction causing immediate rash, facial/tongue/throat swelling, SOB or lightheadedness with hypotension: No Has patient had a PCN reaction causing severe rash involving mucus membranes or skin necrosis: unknown Has patient had a PCN reaction that required hospitalization: No Has patient had a PCN reaction occurring within the last 10 years: No If all of the above answers are "NO", then may proceed with Cephalosporin use.      Past Medical History:  Diagnosis Date  . Anxiety   . Arthritis   . Cancer ([place]) 08/21/2017  . Chronic pain 1996   due to work injury  . Colon polyp    adenomatous polyp ascending colon  . Cough    THAT WONT GO AWAY-PT TO SEE DR Doy Hutching ON 06-10-17 @ 4 PM  . Depression   . Diabetes mellitus without complication ([place])   . Dysrhythmia    AFIB X 1  . GERD (gastroesophageal reflux disease)   . Herniated lumbar intervertebral disc   . Hypertension   . Hypothyroidism   . Neck pain   . Neuromuscular disorder ([place])   . Presence of intrathecal pump    Dr. Mechele Dawley in [place], [place]     Past Surgical History:  Procedure Laterality Date  . COLONOSCOPY  2013   [place]  . IMAGE GUIDED SINUS SURGERY    . implanted morphine pump  2016  . IR FLUORO GUIDE PORT INSERTION LEFT  09/26/2017  . LAPAROSCOPIC CHOLECYSTECTOMY  2013  . UMBILICAL HERNIA REPAIR  2013  . VASECTOMY      Social History   Socioeconomic History  . Marital status: Married    Spouse name: Not on file  . Number of children: Not on file  . Years of education: Not on file  . Highest education level: Not on file  Social Needs  .  Financial resource strain: Not on file  . Food insecurity - worry: Not on file  . Food insecurity - inability: Not on file  . Transportation needs - medical: Not on file  . Transportation needs - non-medical: Not on file  Occupational History  . Not on file  Tobacco Use  . Smoking status: Former Smoker    Years: 20.00    Types: Cigarettes    Last attempt to quit: 12/03/1999    Years since quitting: 17.8  . Smokeless tobacco: Never Used  Substance and Sexual Activity  . Alcohol use: No  . Drug use: No  . Sexual activity: Yes  Other Topics Concern  . Not on file  Social History Narrative  . Not on file    Family History  Problem Relation Age of Onset  . Breast cancer Cousin 8  . Cancer - Colon Neg Hx   . Prostate cancer Neg Hx      Current Outpatient Medications:  .  amitriptyline (ELAVIL) 100 MG tablet, Take 50 mg by mouth at bedtime as needed for sleep. , Disp: , Rfl:  .  apixaban (ELIQUIS) 5 MG TABS tablet, Take  1 tablet 2 (two) times daily by mouth., Disp: , Rfl:  .  Baclofen 5 MG TABS, TAKE 1 TABLET BY MOUTH THREE TIMES DAILY FOR HICCUPS, Disp: 270 tablet, Rfl: 0 .  Cholecalciferol (VITAMIN D3) 5000 units TABS, Take 5,000 Units by mouth daily. , Disp: , Rfl:  .  citalopram (CELEXA) 40 MG tablet, Take 40 mg by mouth every morning. , Disp: , Rfl:  .  dexamethasone (DECADRON) 4 MG tablet, Take 2 tablets by mouth once a day on the day after chemotherapy and then take 2 tablets two times a day for 2 days. Take with food., Disp: 30 tablet, Rfl: 1 .  diltiazem (CARDIZEM CD) 240 MG 24 hr capsule, Take 1 capsule daily by mouth., Disp: , Rfl:  .  esomeprazole (NEXIUM) 40 MG capsule, Take 40 mg by mouth daily as needed. , Disp: , Rfl:  .  glyBURIDE-metformin (GLUCOVANCE) 5-500 MG tablet, Take 0.5 tablets by mouth See admin instructions. Takes 0.5 tablet twice daily in morning and bedtime and takes a 3rd dose only if needed for elevated blood sugar, Disp: , Rfl:  .   HYDROcodone-acetaminophen (NORCO) 7.5-325 MG tablet, Take 1 tablet by mouth every 6 (six) hours as needed for moderate pain. , Disp: , Rfl:  .  Insulin Glargine (BASAGLAR KWIKPEN) 100 UNIT/ML SOPN, Inject 26 Units 2 (two) times daily into the skin. , Disp: , Rfl:  .  KRILL OIL PO, Take 1 capsule by mouth daily. , Disp: , Rfl:  .  lidocaine-prilocaine (EMLA) cream, Apply to affected area once, Disp: 30 g, Rfl: 3 .  lisinopril-hydrochlorothiazide (PRINZIDE,ZESTORETIC) 20-25 MG tablet, Take 1 tablet by mouth daily. , Disp: , Rfl:  .  LORazepam (ATIVAN) 0.5 MG tablet, Take 1 tablet (0.5 mg total) by mouth every 6 (six) hours as needed (Nausea or vomiting)., Disp: 30 tablet, Rfl: 0 .  MORPHINE SULFATE IJ, Inject as directed. Morphine 4mg /ml epidural pump. 1.4897 mg/day, Disp: , Rfl:  .  niacin 500 MG tablet, Take 500 mg by mouth at bedtime., Disp: , Rfl:  .  OLANZapine (ZYPREXA) 10 MG tablet, Take 1 tablet (10 mg total) at bedtime by mouth., Disp: 30 tablet, Rfl: 0 .  ondansetron (ZOFRAN) 8 MG tablet, Take 1 tablet (8 mg total) by mouth 2 (two) times daily as needed. Start on the third day after chemotherapy., Disp: 30 tablet, Rfl: 1 .  oxymetazoline (AFRIN) 0.05 % nasal spray, Place 1 spray into both nostrils 2 (two) times daily as needed for congestion., Disp: , Rfl:  .  pregabalin (LYRICA) 150 MG capsule, Take 150 mg by mouth 2 (two) times daily., Disp: , Rfl:  .  prochlorperazine (COMPAZINE) 10 MG tablet, Take 1 tablet (10 mg total) by mouth every 6 (six) hours as needed (Nausea or vomiting)., Disp: 30 tablet, Rfl: 1 .  sucralfate (CARAFATE) 1 g tablet, Take 1 tablet (1 g total) 3 (three) times daily by mouth. Dissolve in 2-3 tbsp warm water, swish and swallow., Disp: 90 tablet, Rfl: 3 .  thyroid (ARMOUR) 32.5 MG tablet, Take 32.5 mg by mouth every morning. , Disp: , Rfl:  .  tiZANidine (ZANAFLEX) 4 MG tablet, Take 4 mg by mouth every 8 (eight) hours as needed for muscle spasms., Disp: , Rfl:  .   vitamin E (VITAMIN E) 400 UNIT capsule, Take 400 Units by mouth daily., Disp: , Rfl:  .  zinc gluconate 50 MG tablet, Take 50 mg by mouth daily., Disp: , Rfl:  Physical exam:  Vitals:   10/14/17 0931  BP: 108/72  Pulse: (!) 174  Resp: 15  Temp: 98.2 F (36.8 C)  TempSrc: Tympanic  Weight: 227 lb (103 kg)   Physical Exam  Constitutional: He is oriented to person, place, and time and well-developed, well-nourished, and in no distress.  HENT:  Head: Normocephalic and atraumatic.  Grade 1 mucositis noted  Eyes: EOM are normal. Pupils are equal, round, and reactive to light.  Neck: Normal range of motion.  Cardiovascular: Normal heart sounds.  Tachycardic and irregular  Pulmonary/Chest: Effort normal and breath sounds normal.  Abdominal: Soft. Bowel sounds are normal.  Neurological: He is alert and oriented to person, place, and time.  Skin: Skin is warm and dry.     CMP Latest Ref Rng & Units 10/14/2017  Glucose 65 - 99 mg/dL 377(H)  BUN 6 - 20 mg/dL 28(H)  Creatinine 0.61 - 1.24 mg/dL 1.06  Sodium 135 - 145 mmol/L 134(L)  Potassium 3.5 - 5.1 mmol/L 4.5  Chloride 101 - 111 mmol/L 97(L)  CO2 22 - 32 mmol/L 30  Calcium 8.9 - 10.3 mg/dL 9.0   CBC Latest Ref Rng & Units 10/14/2017  WBC 3.8 - 10.6 K/uL 12.5(H)  Hemoglobin 13.0 - 18.0 g/dL 14.3  Hematocrit 40.0 - 52.0 % 43.3  Platelets 150 - 440 K/uL 200    No images are attached to the encounter.  Ir Fluoro Guide Port Insertion Left  Result Date: 09/26/2017 INDICATION: 61 year old with squamous cell carcinoma of the neck. Port-A-Cath needed for chemotherapy. EXAM: FLUOROSCOPIC AND ULTRASOUND GUIDED PLACEMENT OF A SUBCUTANEOUS PORT. Physician: Stephan Minister. Anselm Pancoast, MD MEDICATIONS: Vancomycin 1 gm IV; As antibiotic prophylaxis, Ancef 1 gm was ordered pre-procedure and administered intravenously within one hour of incision. ANESTHESIA/SEDATION: Versed 3.0 mg IV; Fentanyl 75 mcg IV; Moderate Sedation Time:  33 minutes The patient was  continuously monitored during the procedure by the interventional radiology nurse under my direct supervision. FLUOROSCOPY TIME:  36 seconds, 9 mGy COMPLICATIONS: None immediate. PROCEDURE: The risks of the procedure were explained to the patient. Informed consent was obtained. Patient was placed supine on the interventional table. Right cervical lymphadenopathy was evaluated with ultrasound. Based on the location of the lymphadenopathy and upcoming radiation, decided to evaluate the left side of the neck for port placement. Ultrasound confirmed a patent left internal jugular vein. The left chest and neck were cleaned with a skin antiseptic and a sterile drape was placed. Maximal barrier sterile technique was utilized including caps, mask, sterile gowns, sterile gloves, sterile drape, hand hygiene and skin antiseptic. The left neck was anesthetized with 1% lidocaine. Small incision was made in the left neck with a blade. Micropuncture set was placed in the left IJ with ultrasound guidance. The micropuncture wire was used for measurement purposes. The left chest was anesthetized with 1% lidocaine with epinephrine. #15 blade was used to make an incision and a subcutaneous port pocket was formed. [place] was assembled. Subcutaneous tunnel was formed with a stiff tunneling device. The port catheter was brought through the subcutaneous tunnel. The port was placed in the subcutaneous pocket. The micropuncture set was exchanged for a peel-away sheath. The catheter was placed through the peel-away sheath and the tip was positioned at the superior cavoatrial junction. Catheter placement was confirmed with fluoroscopy. The port was accessed and flushed with heparinized saline. The port pocket was closed using two layers of absorbable sutures and Dermabond. The vein skin site was closed using  a single layer of absorbable suture and Dermabond. Sterile dressings were applied. Patient tolerated the procedure well  without an immediate complication. Ultrasound and fluoroscopic images were taken and saved for this procedure. IMPRESSION: Placement of a subcutaneous port device. The catheter tip at the superior cavoatrial junction and ready to be used. Electronically Signed   By: Markus Daft M.D.   On: 09/26/2017 14:50     Assessment and plan- Patient is a 61 y.o. male with SCC of oropharynx Stage I T2N1M0 here for on treatment assessment prior to cycle 3 of weekly cisplatin/RT   this is week 3 of cisplatin radiation.  He did receive radiation yesterday and today.  Given his ongoing mucositis Dr. Donella Stade will reassess tomorrow and decide if he will continue radiation this week or not.  Patient will not be receiving his dose 3 of cisplatin today given his uncontrolled atrial fibrillation.  I will see him back in 1 week's time for cycle 4 of cisplatin with CBC, CMP and magnesium  Paroxysmal atrial fibrillation: Patient has been seen by Dr. Nehemiah Massed yesterday and was started on Cardizem as well as Eliquis.  I did speak to Dr. Nehemiah Massed this morning given his uncontrolled atrial fibrillation and he plans to see him in the office today and continue to work with his Cardizem in hopes that his heart rate will be better controlled over the next couple of days.  We would be unable to continue chemotherapy unless his heart rate is controlled  Hyperglycemia today his blood sugar is 377: He has seen Dr. Doy Hutching who is adjusting his insulin and his dose was just increased yesterday.  Continue to monitor  Patient will continue to use his outpatient morphine for his throat pain.  He is also been prescribed medications by Dr. Donella Stade for mucositis as well  Hiccups have improved with baclofen   I will see him back in 1 week's time with labs for cycle #4 of cisplatin   Visit Diagnosis 1. Oropharyngeal cancer ([place])   2. Paroxysmal A-fib ([place])      Dr. Randa Evens, MD, MPH [facility] at [facility] Pager-  [phone] 10/14/2017 11:51 AM

## 2017-10-14 NOTE — Telephone Encounter (Signed)
Per Dr. Elroy Channel request, spoke to patient via telephone and instructed him to stop the Zyprexa (olanzapine) due to a possible interaction with the Celexa (citalopram) - prolonged QT Interval. Patient will stop taking the Zyprexa.    dhs

## 2017-10-14 NOTE — Telephone Encounter (Signed)
Lab/MD/Cisplatin q 7 days, per 10/14/17 los.  Appts scheduled for 10/21/17 and 10/28/17. S/W  Kim/Rad Onc (1st), to try rschd rad for earlier or later time, to accommodate MD and Infusion appt and was told that it could not be schd before 8:30 a.m neither the afternoon.  MD appt  On 10/21/17, scheduled 15 mins from Infusion appt, to accommodate Infusion schedule.  Judeen Hammans is aware.

## 2017-10-14 NOTE — Telephone Encounter (Signed)
Patient left without checking out at Scheduling area. Called patient to conf appointment dates. Phone number listed not working properly. Got 1 ring, then goes silent. Judeen Hammans advised. Appointment schedule mailed to patient.

## 2017-10-14 NOTE — Progress Notes (Signed)
Patient here today for follow up with labs and treatment. He saw Dr. Doy Hutching yesterday for his elevated blood glucose levels, and he increased his insulin to 26 units BID. He was found to be in AFIB and was referred to Dr. Nehemiah Massed. He saw Dr. Nehemiah Massed yesterday for the AFIB and was started on Eloquis.  He states that he has been feeling really bad over the last week. He continues to have chronic back, neck and shoulder pain. Patient's pulse was checked manually and he appears to be in rapid AFIB now.

## 2017-10-15 ENCOUNTER — Ambulatory Visit: Payer: Federal, State, Local not specified - PPO

## 2017-10-15 ENCOUNTER — Other Ambulatory Visit: Payer: Self-pay | Admitting: *Deleted

## 2017-10-15 MED ORDER — FLUCONAZOLE 100 MG PO TABS: 100.0000 mg | ORAL_TABLET | Freq: Every day | ORAL | 0 refills | Status: DC

## 2017-10-16 ENCOUNTER — Ambulatory Visit: Payer: Federal, State, Local not specified - PPO

## 2017-10-16 ENCOUNTER — Inpatient Hospital Stay: Payer: Federal, State, Local not specified - PPO

## 2017-10-16 NOTE — Progress Notes (Signed)
Nutrition  Was planning to meet with patient after radiation therapy today but therapy cancelled.  Will plan follow-up on 11/29.    Arrionna Serena B. Zenia Resides, [place], [place] Registered Dietitian [phone] (pager)

## 2017-10-17 ENCOUNTER — Ambulatory Visit: Payer: Federal, State, Local not specified - PPO

## 2017-10-20 ENCOUNTER — Other Ambulatory Visit: Payer: Self-pay | Admitting: *Deleted

## 2017-10-20 ENCOUNTER — Ambulatory Visit
Admission: RE | Admit: 2017-10-20 | Discharge: 2017-10-20 | Disposition: A | Discharge status: Home / Self Care | Payer: Federal, State, Local not specified - PPO | Source: Ambulatory Visit | Attending: Radiation Oncology | Admitting: Radiation Oncology

## 2017-10-20 DIAGNOSIS — C090 Malignant neoplasm of tonsillar fossa: Secondary | ICD-10-CM | POA: Diagnosis not present

## 2017-10-20 MED ORDER — FLUCONAZOLE 100 MG PO TABS: 100.0000 mg | ORAL_TABLET | Freq: Every day | ORAL | 0 refills | Status: DC

## 2017-10-20 NOTE — Progress Notes (Signed)
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 Patient is a 61 year old male who was found to have a right neck mass about 1 year ago when he underwent morphine pump placement. At that time he was seen by ENT and also went through antibiotic course but was not found to have a malignancy back then. Patient recently underwent a hemicolectomy by Dr. Jamal Collin as he was found to have a colonic polyp which was not resectable on colonoscopy. Shortly following the surgery patient again noticed right sided neck mass. He wasttreated with empiric antibiotics by Dr. Doy Hutching but the mass did not go away and he was referred to Dr. Tami Ribas. Patient was found to have tonsillar asymmetry on NPL exam and underwent CT neck  2. CT soft tissue of the neck. Multiple right-sided lymph nodes at level IIA and 2B between 1.5-1.8 cm in size which were suspicious in appearance.also noted to have started asymmetric soft tissue fullness in the oropharynx or on the right lateral wall in the right palatine tonsil region measuring up to 2.7 cm.  3. 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 This was followed by a PET CT scan which showed right palatine tonsillar mass with a maximum SUV of 8.5 compatible with  malignancy. Several right level IIA lymph nodes are present and are hypermetabolic, larger node measuring 1.6 cm in short axis. No evidence of metastatic spread to the chest abdomen and pelvis or visualized skeleton  5. Patient has been referred to [place] for further management. Currently patient reports some pain and discomfort in his right neck. Denies any difficulty swallowing. His appetite is good and he does not report any unintentional weight loss. He does have chronic pain for which she has a morphine pump in place    Interval history-chemo was held last week due to atrial flutter that was not controlled.  Patient saw Dr. Nehemiah Massed and currently his heart rate is under control with Cardizem.  Also his radiation was held last week due to worsening mucositis and has been restarted this week.  He continues to have mucositis associated pain but it is overall better.  He uses as needed hydrocodone needed hydrocodone for the same.  Fatigue is better  ECOG PS- 1 Pain scale- 5 Opioid associated constipation- no  Review of systems- Review of Systems  Constitutional: Positive for malaise/fatigue. Negative for chills, fever and weight loss.  HENT: Negative for congestion, ear discharge and nosebleeds.        Throat pain  Eyes: Negative for blurred vision.  Respiratory: Negative for cough, hemoptysis, sputum production, shortness of breath and wheezing.   Cardiovascular: Negative for chest pain, palpitations, orthopnea and claudication.  Gastrointestinal: Negative for abdominal pain, blood in stool, constipation, diarrhea, heartburn, melena, nausea and vomiting.  Genitourinary: Negative for dysuria, flank pain, frequency, hematuria and urgency.  Musculoskeletal: Negative for back pain, joint pain and myalgias.  Skin: Negative  for rash.  Neurological: Negative for dizziness, tingling, focal weakness, seizures, weakness and headaches.  Endo/Heme/Allergies: Does not bruise/bleed easily.    Psychiatric/Behavioral: Negative for depression and suicidal ideas. The patient does not have insomnia.       Allergies  Allergen Reactions  . Biaxin [Clarithromycin] Other (See Comments)    Severe hiccups  . Ceftin [Cefuroxime Axetil] Other (See Comments)    Severe hiccups  . Penicillins Hives    Has patient had a PCN reaction causing immediate rash, facial/tongue/throat swelling, SOB or lightheadedness with hypotension: No Has patient had a PCN reaction causing severe rash involving mucus membranes or skin necrosis: unknown Has patient had a PCN reaction that required hospitalization: No Has patient had a PCN reaction occurring within the last 10 years: No If all of the above answers are "NO", then may proceed with Cephalosporin use.      Past Medical History:  Diagnosis Date  . Anxiety   . Arthritis   . Cancer ([place]) 08/21/2017  . Chronic pain 1996   due to work injury  . Colon polyp    adenomatous polyp ascending colon  . Cough    THAT WONT GO AWAY-PT TO SEE DR Doy Hutching ON 06-10-17 @ 4 PM  . Depression   . Diabetes mellitus without complication ([place])   . Dysrhythmia    AFIB X 1  . GERD (gastroesophageal reflux disease)   . Herniated lumbar intervertebral disc   . Hypertension   . Hypothyroidism   . Neck pain   . Neuromuscular disorder ([place])   . Presence of intrathecal pump    Dr. Mechele Dawley in [place], [place]     Past Surgical History:  Procedure Laterality Date  . COLONOSCOPY  2013   [place]  . COLONOSCOPY WITH PROPOFOL N/A 04/22/2017   Performed by Lollie Sails, MD at [facility]  . HERNIA REPAIR UMBILICAL ADULT  05/05/5408   Performed by Christene Lye, MD at [facility] ORS  . IMAGE GUIDED SINUS SURGERY    . implanted morphine pump  2016  . IR FLUORO GUIDE PORT INSERTION LEFT  09/26/2017  . LAPAROSCOPIC CHOLECYSTECTOMY  2013  . PARTIAL COLECTOMY Right 06/17/2017   Performed by Christene Lye, MD at [facility] ORS  . UMBILICAL  HERNIA REPAIR  2013  . VASECTOMY      Social History   Socioeconomic History  . Marital status: Married    Spouse name: Not on file  . Number of children: Not on file  . Years of education: Not on file  . Highest education level: Not on file  Social Needs  . Financial resource strain: Not on file  . Food insecurity - worry: Not on file  . Food insecurity - inability: Not on file  . Transportation needs - medical: Not on file  . Transportation needs - non-medical: Not on file  Occupational History  . Not on file  Tobacco Use  . Smoking status: Former Smoker    Years: 20.00    Types: Cigarettes    Last attempt to quit: 12/03/1999    Years since quitting: 17.8  . Smokeless tobacco: Never Used  Substance and Sexual Activity  . Alcohol use: No  . Drug use: No  . Sexual activity: Yes  Other Topics Concern  . Not on file  Social History Narrative  . Not on file    Family History  Problem Relation Age of Onset  . Breast cancer Cousin 78  . Cancer -  Colon Neg Hx   . Prostate cancer Neg Hx      Current Outpatient Medications:  .  amitriptyline (ELAVIL) 100 MG tablet, Take 50 mg by mouth at bedtime as needed for sleep. , Disp: , Rfl:  .  apixaban (ELIQUIS) 5 MG TABS tablet, Take 1 tablet 2 (two) times daily by mouth., Disp: , Rfl:  .  Baclofen 5 MG TABS, TAKE 1 TABLET BY MOUTH THREE TIMES DAILY FOR HICCUPS, Disp: 270 tablet, Rfl: 0 .  Cholecalciferol (VITAMIN D3) 5000 units TABS, Take 5,000 Units by mouth daily. , Disp: , Rfl:  .  citalopram (CELEXA) 40 MG tablet, Take 40 mg by mouth every morning. , Disp: , Rfl:  .  dexamethasone (DECADRON) 4 MG tablet, Take 2 tablets by mouth once a day on the day after chemotherapy and then take 2 tablets two times a day for 2 days. Take with food., Disp: 30 tablet, Rfl: 1 .  diltiazem (CARDIZEM CD) 240 MG 24 hr capsule, Take 1 capsule daily by mouth., Disp: , Rfl:  .  esomeprazole (NEXIUM) 40 MG capsule, Take 40 mg by mouth daily as  needed. , Disp: , Rfl:  .  fluconazole (DIFLUCAN) 100 MG tablet, Take 1 tablet (100 mg total) daily by mouth., Disp: 7 tablet, Rfl: 0 .  glyBURIDE-metformin (GLUCOVANCE) 5-500 MG tablet, Take 0.5 tablets by mouth See admin instructions. Takes 0.5 tablet twice daily in morning and bedtime and takes a 3rd dose only if needed for elevated blood sugar, Disp: , Rfl:  .  HYDROcodone-acetaminophen (NORCO) 7.5-325 MG tablet, Take 1 tablet by mouth every 6 (six) hours as needed for moderate pain. , Disp: , Rfl:  .  Insulin Glargine (BASAGLAR KWIKPEN) 100 UNIT/ML SOPN, Inject 26 Units 2 (two) times daily into the skin. , Disp: , Rfl:  .  KRILL OIL PO, Take 1 capsule by mouth daily. , Disp: , Rfl:  .  lidocaine-prilocaine (EMLA) cream, Apply to affected area once, Disp: 30 g, Rfl: 3 .  LORazepam (ATIVAN) 0.5 MG tablet, Take 1 tablet (0.5 mg total) by mouth every 6 (six) hours as needed (Nausea or vomiting)., Disp: 30 tablet, Rfl: 0 .  MORPHINE SULFATE IJ, Inject as directed. Morphine 4mg /ml epidural pump. 1.4897 mg/day, Disp: , Rfl:  .  niacin 500 MG tablet, Take 500 mg by mouth at bedtime., Disp: , Rfl:  .  OLANZapine (ZYPREXA) 10 MG tablet, Take 1 tablet (10 mg total) at bedtime by mouth., Disp: 30 tablet, Rfl: 0 .  ondansetron (ZOFRAN) 8 MG tablet, Take 1 tablet (8 mg total) by mouth 2 (two) times daily as needed. Start on the third day after chemotherapy., Disp: 30 tablet, Rfl: 1 .  oxymetazoline (AFRIN) 0.05 % nasal spray, Place 1 spray into both nostrils 2 (two) times daily as needed for congestion., Disp: , Rfl:  .  pregabalin (LYRICA) 150 MG capsule, Take 150 mg by mouth 2 (two) times daily., Disp: , Rfl:  .  prochlorperazine (COMPAZINE) 10 MG tablet, Take 1 tablet (10 mg total) by mouth every 6 (six) hours as needed (Nausea or vomiting)., Disp: 30 tablet, Rfl: 1 .  sucralfate (CARAFATE) 1 g tablet, Take 1 tablet (1 g total) 3 (three) times daily by mouth. Dissolve in 2-3 tbsp warm water, swish and  swallow., Disp: 90 tablet, Rfl: 3 .  thyroid (ARMOUR) 32.5 MG tablet, Take 32.5 mg by mouth every morning. , Disp: , Rfl:  .  tiZANidine (ZANAFLEX) 4 MG tablet, Take  4 mg by mouth every 8 (eight) hours as needed for muscle spasms., Disp: , Rfl:  .  vitamin E (VITAMIN E) 400 UNIT capsule, Take 400 Units by mouth daily., Disp: , Rfl:  .  zinc gluconate 50 MG tablet, Take 50 mg by mouth daily., Disp: , Rfl:  .  magic mouthwash w/lidocaine SOLN, Take 5 mLs by mouth 4 (four) times daily as needed for up to 10 days for mouth pain., Disp: 200 mL, Rfl: 0 No current facility-administered medications for this visit.   Facility-Administered Medications Ordered in Other Visits:  .  CISplatin (PLATINOL) 92 mg in sodium chloride 0.9 % 250 mL chemo infusion, 40 mg/m2 (Treatment Plan Recorded), Intravenous, Once, Sindy Guadeloupe, MD .  dextrose 5 % and 0.45% NaCl 1,000 mL with potassium chloride 20 mEq, magnesium sulfate 12 mEq infusion, , Intravenous, Once, Sindy Guadeloupe, MD .  fosaprepitant (EMEND) 150 mg, dexamethasone (DECADRON) 12 mg in sodium chloride 0.9 % 145 mL IVPB, , Intravenous, Once, Sindy Guadeloupe, MD .  heparin lock flush 100 unit/mL, 500 Units, Intravenous, Once, Sindy Guadeloupe, MD .  palonosetron (ALOXI) injection 0.25 mg, 0.25 mg, Intravenous, Once, Sindy Guadeloupe, MD .  sodium chloride flush (NS) 0.9 % injection 10 mL, 10 mL, Intravenous, PRN, Sindy Guadeloupe, MD, 10 mL at 10/21/17 0846  Physical exam:  Vitals:   10/21/17 0917  BP: (!) 154/81  Pulse: 74  Temp: (!) 97.3 F (36.3 C)  TempSrc: Tympanic  Weight: 225 lb 9.6 oz (102.3 kg)   Physical Exam  Constitutional: He is oriented to person, place, and time and well-developed, well-nourished, and in no distress.  HENT:  Head: Normocephalic and atraumatic.  Right-sided cervical adenopathy significantly improved but palpable about 1 cm  Evidence of grade 1 mucositis noted  Eyes: EOM are normal. Pupils are equal, round, and reactive to  light.  Neck: Normal range of motion.  Cardiovascular: Normal rate, regular rhythm and normal heart sounds.  Pulmonary/Chest: Effort normal and breath sounds normal.  Abdominal: Soft. Bowel sounds are normal.  Neurological: He is alert and oriented to person, place, and time.  Skin: Skin is warm and dry.     CMP Latest Ref Rng & Units 10/21/2017  Glucose 65 - 99 mg/dL 151(H)  BUN 6 - 20 mg/dL 17  Creatinine 0.61 - 1.24 mg/dL 1.02  Sodium 135 - 145 mmol/L 136  Potassium 3.5 - 5.1 mmol/L 3.9  Chloride 101 - 111 mmol/L 103  CO2 22 - 32 mmol/L 27  Calcium 8.9 - 10.3 mg/dL 8.8(L)   CBC Latest Ref Rng & Units 10/21/2017  WBC 3.8 - 10.6 K/uL 3.9  Hemoglobin 13.0 - 18.0 g/dL 12.0(L)  Hematocrit 40.0 - 52.0 % 35.9(L)  Platelets 150 - 440 K/uL 177    No images are attached to the encounter.  Ir Fluoro Guide Port Insertion Left  Result Date: 09/26/2017 INDICATION: 61 year old with squamous cell carcinoma of the neck. Port-A-Cath needed for chemotherapy. EXAM: FLUOROSCOPIC AND ULTRASOUND GUIDED PLACEMENT OF A SUBCUTANEOUS PORT. Physician: Stephan Minister. Anselm Pancoast, MD MEDICATIONS: Vancomycin 1 gm IV; As antibiotic prophylaxis, Ancef 1 gm was ordered pre-procedure and administered intravenously within one hour of incision. ANESTHESIA/SEDATION: Versed 3.0 mg IV; Fentanyl 75 mcg IV; Moderate Sedation Time:  33 minutes The patient was continuously monitored during the procedure by the interventional radiology nurse under my direct supervision. FLUOROSCOPY TIME:  36 seconds, 9 mGy COMPLICATIONS: None immediate. PROCEDURE: The risks of the procedure were explained  to the patient. Informed consent was obtained. Patient was placed supine on the interventional table. Right cervical lymphadenopathy was evaluated with ultrasound. Based on the location of the lymphadenopathy and upcoming radiation, decided to evaluate the left side of the neck for port placement. Ultrasound confirmed a patent left internal jugular vein.  The left chest and neck were cleaned with a skin antiseptic and a sterile drape was placed. Maximal barrier sterile technique was utilized including caps, mask, sterile gowns, sterile gloves, sterile drape, hand hygiene and skin antiseptic. The left neck was anesthetized with 1% lidocaine. Small incision was made in the left neck with a blade. Micropuncture set was placed in the left IJ with ultrasound guidance. The micropuncture wire was used for measurement purposes. The left chest was anesthetized with 1% lidocaine with epinephrine. #15 blade was used to make an incision and a subcutaneous port pocket was formed. [place] was assembled. Subcutaneous tunnel was formed with a stiff tunneling device. The port catheter was brought through the subcutaneous tunnel. The port was placed in the subcutaneous pocket. The micropuncture set was exchanged for a peel-away sheath. The catheter was placed through the peel-away sheath and the tip was positioned at the superior cavoatrial junction. Catheter placement was confirmed with fluoroscopy. The port was accessed and flushed with heparinized saline. The port pocket was closed using two layers of absorbable sutures and Dermabond. The vein skin site was closed using a single layer of absorbable suture and Dermabond. Sterile dressings were applied. Patient tolerated the procedure well without an immediate complication. Ultrasound and fluoroscopic images were taken and saved for this procedure. IMPRESSION: Placement of a subcutaneous port device. The catheter tip at the superior cavoatrial junction and ready to be used. Electronically Signed   By: Markus Daft M.D.   On: 09/26/2017 14:50     Assessment and plan- Patient is a 61 y.o. male with SCC of oropharynx Stage I T2N1M0 here for on treatment assessment prior to cycle3of weekly cisplatin/RT   Counts okay to proceed with cycle #3 of weekly cisplatin today.  Patient will proceed for cycle #4 of cisplatin  next week (cbc, cmp) and I will see him in 2 weeks time with CBC CMP for cycle #5 of cisplatin.  Paroxysmal atrial fibrillation: Rate controlled with Cardizem  Radiation-induced mucositis: I have prescribed Magic mouthwash for him today.  He will also continue to use his as needed hydrocodone for pain and call if he has any questions or concerns  Chemoradiation induced weight loss: Currently his weight has stabilized around 225 pounds over the last couple of weeks.  I have again reemphasized the importance of oral intake and hydration especially since he had some evidence of acute kidney injury from cisplatin which resolved after IV fluids   Visit Diagnosis 1. Oropharyngeal cancer ([place])   2. Encounter for antineoplastic chemotherapy      Dr. Randa Evens, MD, MPH [facility] at [facility] Pager- [phone] 10/21/2017 10:09 AM

## 2017-10-21 ENCOUNTER — Inpatient Hospital Stay (HOSPITAL_BASED_OUTPATIENT_CLINIC_OR_DEPARTMENT_OTHER): Payer: Federal, State, Local not specified - PPO | Admitting: Oncology

## 2017-10-21 ENCOUNTER — Other Ambulatory Visit: Payer: Self-pay | Admitting: *Deleted

## 2017-10-21 ENCOUNTER — Inpatient Hospital Stay: Payer: Federal, State, Local not specified - PPO

## 2017-10-21 ENCOUNTER — Encounter: Payer: Self-pay | Admitting: Oncology

## 2017-10-21 ENCOUNTER — Ambulatory Visit
Admission: RE | Admit: 2017-10-21 | Discharge: 2017-10-21 | Disposition: A | Discharge status: Home / Self Care | Payer: Federal, State, Local not specified - PPO | Source: Ambulatory Visit | Attending: Radiation Oncology | Admitting: Radiation Oncology

## 2017-10-21 ENCOUNTER — Other Ambulatory Visit: Payer: Self-pay

## 2017-10-21 VITALS — BP 154/81 | HR 74 | Temp 97.3°F | Wt 225.6 lb

## 2017-10-21 DIAGNOSIS — R5383 Other fatigue: Secondary | ICD-10-CM

## 2017-10-21 DIAGNOSIS — R634 Abnormal weight loss: Secondary | ICD-10-CM | POA: Diagnosis not present

## 2017-10-21 DIAGNOSIS — E878 Other disorders of electrolyte and fluid balance, not elsewhere classified: Secondary | ICD-10-CM | POA: Diagnosis not present

## 2017-10-21 DIAGNOSIS — E119 Type 2 diabetes mellitus without complications: Secondary | ICD-10-CM

## 2017-10-21 DIAGNOSIS — I2510 Atherosclerotic heart disease of native coronary artery without angina pectoris: Secondary | ICD-10-CM

## 2017-10-21 DIAGNOSIS — R531 Weakness: Secondary | ICD-10-CM

## 2017-10-21 DIAGNOSIS — I480 Paroxysmal atrial fibrillation: Secondary | ICD-10-CM

## 2017-10-21 DIAGNOSIS — C109 Malignant neoplasm of oropharynx, unspecified: Secondary | ICD-10-CM

## 2017-10-21 DIAGNOSIS — Z86010 Personal history of colonic polyps: Secondary | ICD-10-CM

## 2017-10-21 DIAGNOSIS — R197 Diarrhea, unspecified: Secondary | ICD-10-CM

## 2017-10-21 DIAGNOSIS — N179 Acute kidney failure, unspecified: Secondary | ICD-10-CM | POA: Diagnosis not present

## 2017-10-21 DIAGNOSIS — F419 Anxiety disorder, unspecified: Secondary | ICD-10-CM

## 2017-10-21 DIAGNOSIS — G8929 Other chronic pain: Secondary | ICD-10-CM | POA: Diagnosis not present

## 2017-10-21 DIAGNOSIS — K760 Fatty (change of) liver, not elsewhere classified: Secondary | ICD-10-CM

## 2017-10-21 DIAGNOSIS — F329 Major depressive disorder, single episode, unspecified: Secondary | ICD-10-CM

## 2017-10-21 DIAGNOSIS — N281 Cyst of kidney, acquired: Secondary | ICD-10-CM

## 2017-10-21 DIAGNOSIS — C090 Malignant neoplasm of tonsillar fossa: Secondary | ICD-10-CM | POA: Diagnosis not present

## 2017-10-21 DIAGNOSIS — E1165 Type 2 diabetes mellitus with hyperglycemia: Secondary | ICD-10-CM

## 2017-10-21 DIAGNOSIS — K1230 Oral mucositis (ulcerative), unspecified: Secondary | ICD-10-CM | POA: Diagnosis not present

## 2017-10-21 DIAGNOSIS — I10 Essential (primary) hypertension: Secondary | ICD-10-CM

## 2017-10-21 DIAGNOSIS — E039 Hypothyroidism, unspecified: Secondary | ICD-10-CM

## 2017-10-21 DIAGNOSIS — R110 Nausea: Secondary | ICD-10-CM | POA: Diagnosis not present

## 2017-10-21 DIAGNOSIS — Z5111 Encounter for antineoplastic chemotherapy: Secondary | ICD-10-CM

## 2017-10-21 DIAGNOSIS — K219 Gastro-esophageal reflux disease without esophagitis: Secondary | ICD-10-CM

## 2017-10-21 DIAGNOSIS — E871 Hypo-osmolality and hyponatremia: Secondary | ICD-10-CM

## 2017-10-21 DIAGNOSIS — Z87891 Personal history of nicotine dependence: Secondary | ICD-10-CM

## 2017-10-21 LAB — COMPREHENSIVE METABOLIC PANEL
ALT: 59 U/L (ref 17–63)
AST: 33 U/L (ref 15–41)
Albumin: 3.3 g/dL — ABNORMAL LOW (ref 3.5–5.0)
Alkaline Phosphatase: 55 U/L (ref 38–126)
Anion gap: 8 (ref 5–15)
BUN: 16 mg/dL (ref 6–20)
CO2: 26 mmol/L (ref 22–32)
Calcium: 8.7 mg/dL — ABNORMAL LOW (ref 8.9–10.3)
Chloride: 102 mmol/L (ref 101–111)
Creatinine, Ser: 1.08 mg/dL (ref 0.61–1.24)
GFR calc Af Amer: >60 mL/min (ref >60)
GFR calc non Af Amer: >60 mL/min (ref >60)
Glucose, Bld: 148 mg/dL — ABNORMAL HIGH (ref 65–99)
Potassium: 3.9 mmol/L (ref 3.5–5.1)
Sodium: 136 mmol/L (ref 135–145)
Total Bilirubin: 0.5 mg/dL (ref 0.3–1.2)
Total Protein: 6.7 g/dL (ref 6.5–8.1)

## 2017-10-21 LAB — CBC WITH DIFFERENTIAL/PLATELET
Basophils Absolute: 0 10*3/uL (ref 0–0.1)
Basophils Relative: 1 %
Eosinophils Absolute: 0.1 10*3/uL (ref 0–0.7)
Eosinophils Relative: 2 %
HCT: 35.9 % — ABNORMAL LOW (ref 40.0–52.0)
Hemoglobin: 12 g/dL — ABNORMAL LOW (ref 13.0–18.0)
Lymphocytes Relative: 22 %
Lymphs Abs: 0.8 10*3/uL — ABNORMAL LOW (ref 1.0–3.6)
MCH: 28.2 pg (ref 26.0–34.0)
MCHC: 33.4 g/dL (ref 32.0–36.0)
MCV: 84.4 fL (ref 80.0–100.0)
Monocytes Absolute: 0.5 10*3/uL (ref 0.2–1.0)
Monocytes Relative: 12 %
Neutro Abs: 2.5 10*3/uL (ref 1.4–6.5)
Neutrophils Relative %: 63 %
Platelets: 177 10*3/uL (ref 150–440)
RBC: 4.26 MIL/uL — ABNORMAL LOW (ref 4.40–5.90)
RDW: 14.5 % (ref 11.5–14.5)
WBC: 3.9 10*3/uL (ref 3.8–10.6)

## 2017-10-21 LAB — BASIC METABOLIC PANEL
Anion gap: 6 (ref 5–15)
BUN: 17 mg/dL (ref 6–20)
CO2: 27 mmol/L (ref 22–32)
Calcium: 8.8 mg/dL — ABNORMAL LOW (ref 8.9–10.3)
Chloride: 103 mmol/L (ref 101–111)
Creatinine, Ser: 1.02 mg/dL (ref 0.61–1.24)
GFR calc Af Amer: >60 mL/min (ref >60)
GFR calc non Af Amer: >60 mL/min (ref >60)
Glucose, Bld: 151 mg/dL — ABNORMAL HIGH (ref 65–99)
Potassium: 3.9 mmol/L (ref 3.5–5.1)
Sodium: 136 mmol/L (ref 135–145)

## 2017-10-21 MED ORDER — POTASSIUM CHLORIDE 2 MEQ/ML IV SOLN
Freq: Once | INTRAVENOUS | Status: AC
  Administered 2017-10-21: 10:00:00 via INTRAVENOUS
  Filled 2017-10-21: qty 1000

## 2017-10-21 MED ORDER — SODIUM CHLORIDE 0.9 % IV SOLN
40.0000 mg/m2 | Freq: Once | INTRAVENOUS | Status: AC
  Administered 2017-10-21: 92 mg via INTRAVENOUS
  Filled 2017-10-21: qty 92

## 2017-10-21 MED ORDER — MAGIC MOUTHWASH W/LIDOCAINE: 5.0000 mL | Freq: Four times a day (QID) | ORAL | 0 refills | Status: AC | PRN

## 2017-10-21 MED ORDER — FOSAPREPITANT DIMEGLUMINE INJECTION 150 MG
Freq: Once | INTRAVENOUS | Status: AC
  Administered 2017-10-21: 12:00:00 via INTRAVENOUS
  Filled 2017-10-21: qty 5

## 2017-10-21 MED ORDER — PALONOSETRON HCL INJECTION 0.25 MG/5ML
0.2500 mg | Freq: Once | INTRAVENOUS | Status: AC
  Administered 2017-10-21: 0.25 mg via INTRAVENOUS
  Filled 2017-10-21: qty 5

## 2017-10-21 MED ORDER — SODIUM CHLORIDE 0.9 % IV SOLN
Freq: Once | INTRAVENOUS | Status: AC
  Administered 2017-10-21: 10:00:00 via INTRAVENOUS
  Filled 2017-10-21: qty 1000

## 2017-10-21 MED ORDER — SODIUM CHLORIDE 0.9% FLUSH
10.0000 mL | INTRAVENOUS | Status: DC | PRN
  Administered 2017-10-21: 10 mL via INTRAVENOUS
  Filled 2017-10-21: qty 10

## 2017-10-21 MED ORDER — HEPARIN SOD (PORK) LOCK FLUSH 100 UNIT/ML IV SOLN
500.0000 [IU] | Freq: Once | INTRAVENOUS | Status: AC
  Administered 2017-10-21: 500 [IU] via INTRAVENOUS
  Filled 2017-10-21: qty 5

## 2017-10-22 ENCOUNTER — Ambulatory Visit
Admission: RE | Admit: 2017-10-22 | Discharge: 2017-10-22 | Disposition: A | Discharge status: Home / Self Care | Payer: Federal, State, Local not specified - PPO | Source: Ambulatory Visit | Attending: Radiation Oncology | Admitting: Radiation Oncology

## 2017-10-22 DIAGNOSIS — C090 Malignant neoplasm of tonsillar fossa: Secondary | ICD-10-CM | POA: Diagnosis not present

## 2017-10-27 ENCOUNTER — Ambulatory Visit
Admission: RE | Admit: 2017-10-27 | Discharge: 2017-10-27 | Disposition: A | Discharge status: Home / Self Care | Payer: Federal, State, Local not specified - PPO | Source: Ambulatory Visit | Attending: Radiation Oncology | Admitting: Radiation Oncology

## 2017-10-27 DIAGNOSIS — C090 Malignant neoplasm of tonsillar fossa: Secondary | ICD-10-CM | POA: Diagnosis not present

## 2017-10-28 ENCOUNTER — Inpatient Hospital Stay: Payer: Federal, State, Local not specified - PPO

## 2017-10-28 ENCOUNTER — Ambulatory Visit: Payer: Federal, State, Local not specified - PPO | Admitting: Oncology

## 2017-10-28 ENCOUNTER — Ambulatory Visit
Admission: RE | Admit: 2017-10-28 | Discharge: 2017-10-28 | Disposition: A | Discharge status: Home / Self Care | Payer: Federal, State, Local not specified - PPO | Source: Ambulatory Visit | Attending: Radiation Oncology | Admitting: Radiation Oncology

## 2017-10-28 VITALS — BP 135/79 | HR 73 | Temp 98.1°F | Resp 20 | Wt 222.4 lb

## 2017-10-28 DIAGNOSIS — C090 Malignant neoplasm of tonsillar fossa: Secondary | ICD-10-CM | POA: Diagnosis not present

## 2017-10-28 DIAGNOSIS — C109 Malignant neoplasm of oropharynx, unspecified: Secondary | ICD-10-CM

## 2017-10-28 LAB — CBC WITH DIFFERENTIAL/PLATELET
Basophils Absolute: 0.1 10*3/uL (ref 0–0.1)
Basophils Relative: 1 %
Eosinophils Absolute: 0.1 10*3/uL (ref 0–0.7)
Eosinophils Relative: 1 %
HCT: 40.8 % (ref 40.0–52.0)
Hemoglobin: 13.7 g/dL (ref 13.0–18.0)
Lymphocytes Relative: 21 %
Lymphs Abs: 2 10*3/uL (ref 1.0–3.6)
MCH: 28.4 pg (ref 26.0–34.0)
MCHC: 33.4 g/dL (ref 32.0–36.0)
MCV: 84.9 fL (ref 80.0–100.0)
Monocytes Absolute: 0.8 10*3/uL (ref 0.2–1.0)
Monocytes Relative: 8 %
Neutro Abs: 7 10*3/uL — ABNORMAL HIGH (ref 1.4–6.5)
Neutrophils Relative %: 69 %
Platelets: 272 10*3/uL (ref 150–440)
RBC: 4.81 MIL/uL (ref 4.40–5.90)
RDW: 15.1 % — ABNORMAL HIGH (ref 11.5–14.5)
WBC: 9.9 10*3/uL (ref 3.8–10.6)

## 2017-10-28 LAB — COMPREHENSIVE METABOLIC PANEL
ALT: 41 U/L (ref 17–63)
AST: 19 U/L (ref 15–41)
Albumin: 3.7 g/dL (ref 3.5–5.0)
Alkaline Phosphatase: 53 U/L (ref 38–126)
Anion gap: 9 (ref 5–15)
BUN: 29 mg/dL — ABNORMAL HIGH (ref 6–20)
CO2: 25 mmol/L (ref 22–32)
Calcium: 8.6 mg/dL — ABNORMAL LOW (ref 8.9–10.3)
Chloride: 102 mmol/L (ref 101–111)
Creatinine, Ser: 1.18 mg/dL (ref 0.61–1.24)
GFR calc Af Amer: >60 mL/min (ref >60)
GFR calc non Af Amer: >60 mL/min (ref >60)
Glucose, Bld: 284 mg/dL — ABNORMAL HIGH (ref 65–99)
Potassium: 3.8 mmol/L (ref 3.5–5.1)
Sodium: 136 mmol/L (ref 135–145)
Total Bilirubin: 0.4 mg/dL (ref 0.3–1.2)
Total Protein: 6.6 g/dL (ref 6.5–8.1)

## 2017-10-28 MED ORDER — POTASSIUM CHLORIDE 2 MEQ/ML IV SOLN
Freq: Once | INTRAVENOUS | Status: AC
  Administered 2017-10-28: 10:00:00 via INTRAVENOUS
  Filled 2017-10-28: qty 1000

## 2017-10-28 MED ORDER — SODIUM CHLORIDE 0.9% FLUSH
10.0000 mL | INTRAVENOUS | Status: DC | PRN
  Filled 2017-10-28: qty 10

## 2017-10-28 MED ORDER — SODIUM CHLORIDE 0.9 % IV SOLN
Freq: Once | INTRAVENOUS | Status: AC
  Administered 2017-10-28: 12:00:00 via INTRAVENOUS
  Filled 2017-10-28: qty 5

## 2017-10-28 MED ORDER — SODIUM CHLORIDE 0.9% FLUSH
10.0000 mL | INTRAVENOUS | Status: DC | PRN
  Administered 2017-10-28: 10 mL via INTRAVENOUS
  Filled 2017-10-28: qty 10

## 2017-10-28 MED ORDER — HEPARIN SOD (PORK) LOCK FLUSH 100 UNIT/ML IV SOLN
500.0000 [IU] | Freq: Once | INTRAVENOUS | Status: AC
  Administered 2017-10-28: 500 [IU] via INTRAVENOUS
  Filled 2017-10-28: qty 5

## 2017-10-28 MED ORDER — SODIUM CHLORIDE 0.9 % IV SOLN
Freq: Once | INTRAVENOUS | Status: AC
  Administered 2017-10-28: 10:00:00 via INTRAVENOUS
  Filled 2017-10-28: qty 1000

## 2017-10-28 MED ORDER — PALONOSETRON HCL INJECTION 0.25 MG/5ML
0.2500 mg | Freq: Once | INTRAVENOUS | Status: AC
  Administered 2017-10-28: 0.25 mg via INTRAVENOUS
  Filled 2017-10-28: qty 5

## 2017-10-28 MED ORDER — HEPARIN SOD (PORK) LOCK FLUSH 100 UNIT/ML IV SOLN
500.0000 [IU] | Freq: Once | INTRAVENOUS | Status: AC | PRN
  Administered 2017-10-28: 500 [IU]

## 2017-10-28 MED ORDER — SODIUM CHLORIDE 0.9 % IV SOLN
40.0000 mg/m2 | Freq: Once | INTRAVENOUS | Status: AC
  Administered 2017-10-28: 92 mg via INTRAVENOUS
  Filled 2017-10-28: qty 92

## 2017-10-29 ENCOUNTER — Telehealth: Payer: Self-pay | Admitting: *Deleted

## 2017-10-29 ENCOUNTER — Other Ambulatory Visit: Payer: Self-pay | Admitting: *Deleted

## 2017-10-29 ENCOUNTER — Ambulatory Visit
Admission: RE | Admit: 2017-10-29 | Discharge: 2017-10-29 | Disposition: A | Discharge status: Home / Self Care | Payer: Federal, State, Local not specified - PPO | Source: Ambulatory Visit | Attending: Radiation Oncology | Admitting: Radiation Oncology

## 2017-10-29 DIAGNOSIS — I10 Essential (primary) hypertension: Secondary | ICD-10-CM

## 2017-10-29 DIAGNOSIS — E039 Hypothyroidism, unspecified: Secondary | ICD-10-CM

## 2017-10-29 DIAGNOSIS — Z7189 Other specified counseling: Secondary | ICD-10-CM

## 2017-10-29 DIAGNOSIS — Z125 Encounter for screening for malignant neoplasm of prostate: Secondary | ICD-10-CM

## 2017-10-29 DIAGNOSIS — E7800 Pure hypercholesterolemia, unspecified: Secondary | ICD-10-CM

## 2017-10-29 DIAGNOSIS — C109 Malignant neoplasm of oropharynx, unspecified: Secondary | ICD-10-CM

## 2017-10-29 DIAGNOSIS — C090 Malignant neoplasm of tonsillar fossa: Secondary | ICD-10-CM | POA: Diagnosis not present

## 2017-10-29 DIAGNOSIS — E1142 Type 2 diabetes mellitus with diabetic polyneuropathy: Secondary | ICD-10-CM

## 2017-10-29 NOTE — Telephone Encounter (Signed)
Pt brought in labs that are ordered from dr sparks and he would like them done on 12/18.  Pt gets labs once a week while on chemo and wanted to have labs drawn through port for the labs that dr sparks wanted for his yearly physical.  Dr. Janese Banks is agreeable and I have entered the labs in . Pt was called back to let him know she was agreeable to doing them. Also he knows to be npo 8-12 hours before labs done on 12/18 for the lipid panel and the hgb a1c.

## 2017-10-30 ENCOUNTER — Inpatient Hospital Stay: Payer: Federal, State, Local not specified - PPO

## 2017-10-30 ENCOUNTER — Ambulatory Visit
Admission: RE | Admit: 2017-10-30 | Discharge: 2017-10-30 | Disposition: A | Discharge status: Home / Self Care | Payer: Federal, State, Local not specified - PPO | Source: Ambulatory Visit | Attending: Radiation Oncology | Admitting: Radiation Oncology

## 2017-10-30 DIAGNOSIS — C090 Malignant neoplasm of tonsillar fossa: Secondary | ICD-10-CM | POA: Diagnosis not present

## 2017-10-30 NOTE — Progress Notes (Signed)
Nutrition  Was planning to meet with patient following radiation therapy today.  Called radiation and patient had already completed treatment and left the building so was unable to see patient today.  Kohana Amble B. Zenia Resides, [place], [place] Registered Dietitian [phone] (pager)

## 2017-10-31 ENCOUNTER — Ambulatory Visit
Admission: RE | Admit: 2017-10-31 | Discharge: 2017-10-31 | Disposition: A | Discharge status: Home / Self Care | Payer: Federal, State, Local not specified - PPO | Source: Ambulatory Visit | Attending: Radiation Oncology | Admitting: Radiation Oncology

## 2017-10-31 DIAGNOSIS — C090 Malignant neoplasm of tonsillar fossa: Secondary | ICD-10-CM | POA: Diagnosis not present

## 2017-11-03 ENCOUNTER — Ambulatory Visit
Admission: RE | Admit: 2017-11-03 | Discharge: 2017-11-03 | Disposition: A | Discharge status: Home / Self Care | Payer: Federal, State, Local not specified - PPO | Source: Ambulatory Visit | Attending: Radiation Oncology | Admitting: Radiation Oncology

## 2017-11-03 DIAGNOSIS — C090 Malignant neoplasm of tonsillar fossa: Secondary | ICD-10-CM | POA: Diagnosis not present

## 2017-11-03 NOTE — Progress Notes (Signed)
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 Patient is a 61 year old male who was found to have a right neck mass about 1 year ago when he underwent morphine pump placement. At that time he was seen by ENT and also went through antibiotic course but was not found to have a malignancy back then. Patient recently underwent a hemicolectomy by Dr. Jamal Collin as he was found to have a colonic polyp which was not resectable on colonoscopy. Shortly following the surgery patient again noticed right sided neck mass. He wasttreated with empiric antibiotics by Dr. Doy Hutching but the mass did not go away and he was referred to Dr. Tami Ribas. Patient was found to have tonsillar asymmetry on NPL exam and underwent CT neck  2. CT soft tissue of the neck. Multiple right-sided lymph nodes at level IIA and 2B between 1.5-1.8 cm in size which were suspicious in appearance.also noted to have started asymmetric soft tissue fullness in the oropharynx or on the right lateral wall in the right palatine tonsil region measuring up to 2.7 cm.  3. 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 This was followed by a PET CT scan which showed right palatine tonsillar mass with a maximum SUV of 8.5 compatible with  malignancy. Several right level IIA lymph nodes are present and are hypermetabolic, larger node measuring 1.6 cm in short axis. No evidence of metastatic spread to the chest abdomen and pelvis or visualized skeleton  5. Patient has been referred to [place] for further management. Currently patient reports some pain and discomfort in his right neck. Denies any difficulty swallowing. His appetite is good and he does not report any unintentional weight loss. He does have chronic pain for which she has a morphine pump in place   Interval history-reports no significant nausea or vomiting.  He is trying to eat and keep up with his fluid intake.  His heart rate has been well controlled.  He does report pain from his mucositis which is controlled with his pain medications and mouthwash.  ECOG PS- 1 Pain scale- 5 Opioid associated constipation- no  Review of systems- Review of Systems  Constitutional: Positive for malaise/fatigue. Negative for chills, fever and weight loss.  HENT: Negative for congestion, ear discharge and nosebleeds.        Throat pain positive  Eyes: Negative for blurred vision.  Respiratory: Negative for cough, hemoptysis, sputum production, shortness of breath and wheezing.   Cardiovascular: Negative for chest pain, palpitations, orthopnea and claudication.  Gastrointestinal: Negative for abdominal pain, blood in stool, constipation, diarrhea, heartburn, melena, nausea and vomiting.  Genitourinary: Negative for dysuria, flank pain, frequency, hematuria and urgency.  Musculoskeletal: Negative for back pain, joint pain and myalgias.  Skin: Negative for rash.  Neurological: Positive for weakness. Negative for dizziness, tingling, focal weakness, seizures and headaches.  Endo/Heme/Allergies: Does not bruise/bleed easily.  Psychiatric/Behavioral: Negative for depression and suicidal ideas.  The patient does not have insomnia.       Allergies  Allergen Reactions  . Biaxin  [Clarithromycin] Other (See Comments)    Severe hiccups  . Ceftin [Cefuroxime Axetil] Other (See Comments)    Severe hiccups  . Penicillins Hives    Has patient had a PCN reaction causing immediate rash, facial/tongue/throat swelling, SOB or lightheadedness with hypotension: No Has patient had a PCN reaction causing severe rash involving mucus membranes or skin necrosis: unknown Has patient had a PCN reaction that required hospitalization: No Has patient had a PCN reaction occurring within the last 10 years: No If all of the above answers are "NO", then may proceed with Cephalosporin use.      Past Medical History:  Diagnosis Date  . Anxiety   . Arthritis   . Cancer ([place]) 08/21/2017  . Chronic pain 1996   due to work injury  . Colon polyp    adenomatous polyp ascending colon  . Cough    THAT WONT GO AWAY-PT TO SEE DR Doy Hutching ON 06-10-17 @ 4 PM  . Depression   . Diabetes mellitus without complication ([place])   . Dysrhythmia    AFIB X 1  . GERD (gastroesophageal reflux disease)   . Herniated lumbar intervertebral disc   . Hypertension   . Hypothyroidism   . Neck pain   . Neuromuscular disorder ([place])   . Presence of intrathecal pump    Dr. Mechele Dawley in [place], [place]     Past Surgical History:  Procedure Laterality Date  . COLONOSCOPY  2013   [place]  . COLONOSCOPY WITH PROPOFOL N/A 04/22/2017   Procedure: COLONOSCOPY WITH PROPOFOL;  Surgeon: Lollie Sails, MD;  Location: [facility] ENDOSCOPY;  Service: Endoscopy;  Laterality: N/A;  . IMAGE GUIDED SINUS SURGERY    . implanted morphine pump  2016  . IR FLUORO GUIDE PORT INSERTION LEFT  09/26/2017  . LAPAROSCOPIC CHOLECYSTECTOMY  2013  . PARTIAL COLECTOMY Right 06/17/2017   Procedure: PARTIAL COLECTOMY;  Surgeon: Christene Lye, MD;  Location: [facility] ORS;  Service: General;  Laterality: Right;  . UMBILICAL HERNIA REPAIR  2013  . UMBILICAL HERNIA REPAIR  06/17/2017   Procedure: HERNIA REPAIR UMBILICAL  ADULT;  Surgeon: Christene Lye, MD;  Location: [facility] ORS;  Service: General;;  . VASECTOMY      Social History   Socioeconomic History  . Marital status: Married    Spouse name: Not on file  . Number of children: Not on file  . Years of education: Not on file  . Highest education level: Not on file  Social Needs  . Financial resource strain: Not on file  . Food insecurity - worry: Not on file  . Food insecurity - inability: Not on file  . Transportation needs - medical: Not on file  . Transportation needs - non-medical: Not on file  Occupational History  . Not on file  Tobacco Use  . Smoking status: Former Smoker    Years: 20.00    Types: Cigarettes    Last attempt to quit: 12/03/1999    Years since quitting: 17.9  . Smokeless tobacco: Never Used  Substance and Sexual Activity  . Alcohol use: No  . Drug use: No  . Sexual activity: Yes  Other Topics Concern  . Not on file  Social History Narrative  . Not on file    Family History  Problem Relation Age of Onset  . Breast cancer Cousin 60  . Cancer -  Colon Neg Hx   . Prostate cancer Neg Hx      Current Outpatient Medications:  .  amitriptyline (ELAVIL) 100 MG tablet, Take 50 mg by mouth at bedtime as needed for sleep. , Disp: , Rfl:  .  apixaban (ELIQUIS) 5 MG TABS tablet, Take 1 tablet 2 (two) times daily by mouth., Disp: , Rfl:  .  Baclofen 5 MG TABS, TAKE 1 TABLET BY MOUTH THREE TIMES DAILY FOR HICCUPS, Disp: 270 tablet, Rfl: 0 .  Cholecalciferol (VITAMIN D3) 5000 units TABS, Take 5,000 Units by mouth daily. , Disp: , Rfl:  .  citalopram (CELEXA) 40 MG tablet, Take 40 mg by mouth every morning. , Disp: , Rfl:  .  dexamethasone (DECADRON) 4 MG tablet, Take 2 tablets by mouth once a day on the day after chemotherapy and then take 2 tablets two times a day for 2 days. Take with food., Disp: 30 tablet, Rfl: 1 .  diltiazem (CARDIZEM CD) 240 MG 24 hr capsule, Take 1 capsule daily by mouth., Disp: , Rfl:  .   esomeprazole (NEXIUM) 40 MG capsule, Take 40 mg by mouth daily as needed. , Disp: , Rfl:  .  fluconazole (DIFLUCAN) 100 MG tablet, Take 1 tablet (100 mg total) daily by mouth., Disp: 7 tablet, Rfl: 0 .  glyBURIDE-metformin (GLUCOVANCE) 5-500 MG tablet, Take 0.5 tablets by mouth See admin instructions. Takes 0.5 tablet twice daily in morning and bedtime and takes a 3rd dose only if needed for elevated blood sugar, Disp: , Rfl:  .  HYDROcodone-acetaminophen (NORCO) 7.5-325 MG tablet, Take 1 tablet by mouth every 6 (six) hours as needed for moderate pain. , Disp: , Rfl:  .  Insulin Glargine (BASAGLAR KWIKPEN) 100 UNIT/ML SOPN, Inject 26 Units 2 (two) times daily into the skin. , Disp: , Rfl:  .  KRILL OIL PO, Take 1 capsule by mouth daily. , Disp: , Rfl:  .  lidocaine-prilocaine (EMLA) cream, Apply to affected area once, Disp: 30 g, Rfl: 3 .  LORazepam (ATIVAN) 0.5 MG tablet, Take 1 tablet (0.5 mg total) by mouth every 6 (six) hours as needed (Nausea or vomiting)., Disp: 30 tablet, Rfl: 0 .  MORPHINE SULFATE IJ, Inject as directed. Morphine 4mg /ml epidural pump. 1.4897 mg/day, Disp: , Rfl:  .  niacin 500 MG tablet, Take 500 mg by mouth at bedtime., Disp: , Rfl:  .  ondansetron (ZOFRAN) 8 MG tablet, Take 1 tablet (8 mg total) by mouth 2 (two) times daily as needed. Start on the third day after chemotherapy., Disp: 30 tablet, Rfl: 1 .  oxymetazoline (AFRIN) 0.05 % nasal spray, Place 1 spray into both nostrils 2 (two) times daily as needed for congestion., Disp: , Rfl:  .  pregabalin (LYRICA) 150 MG capsule, Take 150 mg by mouth 2 (two) times daily., Disp: , Rfl:  .  prochlorperazine (COMPAZINE) 10 MG tablet, Take 1 tablet (10 mg total) by mouth every 6 (six) hours as needed (Nausea or vomiting)., Disp: 30 tablet, Rfl: 1 .  sucralfate (CARAFATE) 1 g tablet, Take 1 tablet (1 g total) 3 (three) times daily by mouth. Dissolve in 2-3 tbsp warm water, swish and swallow., Disp: 90 tablet, Rfl: 3 .  thyroid  (ARMOUR) 32.5 MG tablet, Take 32.5 mg by mouth every morning. , Disp: , Rfl:  .  tiZANidine (ZANAFLEX) 4 MG tablet, Take 4 mg by mouth every 8 (eight) hours as needed for muscle spasms., Disp: , Rfl:  .  vitamin E (VITAMIN  E) 400 UNIT capsule, Take 400 Units by mouth daily., Disp: , Rfl:  .  zinc gluconate 50 MG tablet, Take 50 mg by mouth daily., Disp: , Rfl:   Physical exam:  Vitals:   11/04/17 0932  BP: 119/86  Pulse: 97  Resp: 16  Temp: (!) 96.4 F (35.8 C)  TempSrc: Tympanic  Weight: 212 lb 11.9 oz (96.5 kg)   Physical Exam  Constitutional: He is oriented to person, place, and time and well-developed, well-nourished, and in no distress.  HENT:  Head: Normocephalic and atraumatic.  Grade 1 mucositis noted  Eyes: EOM are normal. Pupils are equal, round, and reactive to light.  Neck: Normal range of motion.  Cardiovascular: Normal rate, regular rhythm and normal heart sounds.  Pulmonary/Chest: Effort normal and breath sounds normal.  Abdominal: Soft. Bowel sounds are normal.  Neurological: He is alert and oriented to person, place, and time.  Skin: Skin is warm and dry.     CMP Latest Ref Rng & Units 10/28/2017  Glucose 65 - 99 mg/dL 284(H)  BUN 6 - 20 mg/dL 29(H)  Creatinine 0.61 - 1.24 mg/dL 1.18  Sodium 135 - 145 mmol/L 136  Potassium 3.5 - 5.1 mmol/L 3.8  Chloride 101 - 111 mmol/L 102  CO2 22 - 32 mmol/L 25  Calcium 8.9 - 10.3 mg/dL 8.6(L)  Total Protein 6.5 - 8.1 g/dL 6.6  Total Bilirubin 0.3 - 1.2 mg/dL 0.4  Alkaline Phos 38 - 126 U/L 53  AST 15 - 41 U/L 19  ALT 17 - 63 U/L 41   CBC Latest Ref Rng & Units 10/28/2017  WBC 3.8 - 10.6 K/uL 9.9  Hemoglobin 13.0 - 18.0 g/dL 13.7  Hematocrit 40.0 - 52.0 % 40.8  Platelets 150 - 440 K/uL 272     Assessment and plan- Patient is a 61 y.o. male with SCC of oropharynx Stage I T2N1M0 here for on treatment assessment prior to cycle5of weekly cisplatin/RT   Counts okay to proceed with cycle #5 of weekly cisplatin  today.  Patient will proceed for cycle #6 of cisplatin next week (cbc, cmp) and I will see him in 2 weeks time with CBC CMP for cycle # 7 of cisplatin.  Paroxysmal atrial fibrillation: Rate controlled with Cardizem  Radiation-induced mucositis: Continue Magic mouthwash and hydrocodone.  Patient also has a morphine pump in place  Chemoradiation induced weight loss: Encourage p.o. intake.  No need for IV fluids today    Visit Diagnosis 1. Oropharyngeal cancer ([place])   2. Encounter for antineoplastic chemotherapy   3. Mucositis due to radiation therapy      Dr. Randa Evens, MD, MPH [facility] Pager- [phone] 11/04/2017 12:52 PM

## 2017-11-04 ENCOUNTER — Ambulatory Visit
Admission: RE | Admit: 2017-11-04 | Discharge: 2017-11-04 | Disposition: A | Discharge status: Home / Self Care | Payer: Federal, State, Local not specified - PPO | Source: Ambulatory Visit | Attending: Radiation Oncology | Admitting: Radiation Oncology

## 2017-11-04 ENCOUNTER — Encounter: Payer: Self-pay | Admitting: Oncology

## 2017-11-04 ENCOUNTER — Inpatient Hospital Stay (HOSPITAL_BASED_OUTPATIENT_CLINIC_OR_DEPARTMENT_OTHER): Payer: Federal, State, Local not specified - PPO | Admitting: Oncology

## 2017-11-04 ENCOUNTER — Inpatient Hospital Stay: Payer: Federal, State, Local not specified - PPO | Attending: Oncology

## 2017-11-04 ENCOUNTER — Inpatient Hospital Stay: Payer: Federal, State, Local not specified - PPO

## 2017-11-04 VITALS — BP 119/86 | HR 97 | Temp 96.4°F | Resp 16 | Wt 212.7 lb

## 2017-11-04 DIAGNOSIS — Z923 Personal history of irradiation: Secondary | ICD-10-CM

## 2017-11-04 DIAGNOSIS — R531 Weakness: Secondary | ICD-10-CM | POA: Insufficient documentation

## 2017-11-04 DIAGNOSIS — Z5111 Encounter for antineoplastic chemotherapy: Secondary | ICD-10-CM

## 2017-11-04 DIAGNOSIS — E039 Hypothyroidism, unspecified: Secondary | ICD-10-CM

## 2017-11-04 DIAGNOSIS — K1233 Oral mucositis (ulcerative) due to radiation: Secondary | ICD-10-CM

## 2017-11-04 DIAGNOSIS — C109 Malignant neoplasm of oropharynx, unspecified: Secondary | ICD-10-CM

## 2017-11-04 DIAGNOSIS — C090 Malignant neoplasm of tonsillar fossa: Secondary | ICD-10-CM | POA: Diagnosis not present

## 2017-11-04 DIAGNOSIS — R5383 Other fatigue: Secondary | ICD-10-CM | POA: Diagnosis not present

## 2017-11-04 DIAGNOSIS — R112 Nausea with vomiting, unspecified: Secondary | ICD-10-CM | POA: Insufficient documentation

## 2017-11-04 DIAGNOSIS — F419 Anxiety disorder, unspecified: Secondary | ICD-10-CM

## 2017-11-04 DIAGNOSIS — I480 Paroxysmal atrial fibrillation: Secondary | ICD-10-CM | POA: Insufficient documentation

## 2017-11-04 DIAGNOSIS — G8929 Other chronic pain: Secondary | ICD-10-CM | POA: Diagnosis not present

## 2017-11-04 DIAGNOSIS — R197 Diarrhea, unspecified: Secondary | ICD-10-CM | POA: Insufficient documentation

## 2017-11-04 DIAGNOSIS — E871 Hypo-osmolality and hyponatremia: Secondary | ICD-10-CM | POA: Diagnosis not present

## 2017-11-04 DIAGNOSIS — Y842 Radiological procedure and radiotherapy as the cause of abnormal reaction of the patient, or of later complication, without mention of misadventure at the time of the procedure: Secondary | ICD-10-CM | POA: Insufficient documentation

## 2017-11-04 DIAGNOSIS — I10 Essential (primary) hypertension: Secondary | ICD-10-CM | POA: Diagnosis not present

## 2017-11-04 DIAGNOSIS — G893 Neoplasm related pain (acute) (chronic): Secondary | ICD-10-CM | POA: Diagnosis not present

## 2017-11-04 DIAGNOSIS — K219 Gastro-esophageal reflux disease without esophagitis: Secondary | ICD-10-CM

## 2017-11-04 DIAGNOSIS — E860 Dehydration: Secondary | ICD-10-CM | POA: Insufficient documentation

## 2017-11-04 DIAGNOSIS — Z86010 Personal history of colonic polyps: Secondary | ICD-10-CM

## 2017-11-04 DIAGNOSIS — D696 Thrombocytopenia, unspecified: Secondary | ICD-10-CM | POA: Insufficient documentation

## 2017-11-04 DIAGNOSIS — H6691 Otitis media, unspecified, right ear: Secondary | ICD-10-CM | POA: Insufficient documentation

## 2017-11-04 DIAGNOSIS — I4891 Unspecified atrial fibrillation: Secondary | ICD-10-CM

## 2017-11-04 DIAGNOSIS — R21 Rash and other nonspecific skin eruption: Secondary | ICD-10-CM | POA: Diagnosis not present

## 2017-11-04 DIAGNOSIS — J029 Acute pharyngitis, unspecified: Secondary | ICD-10-CM | POA: Insufficient documentation

## 2017-11-04 DIAGNOSIS — R630 Anorexia: Secondary | ICD-10-CM | POA: Insufficient documentation

## 2017-11-04 DIAGNOSIS — F329 Major depressive disorder, single episode, unspecified: Secondary | ICD-10-CM

## 2017-11-04 DIAGNOSIS — C770 Secondary and unspecified malignant neoplasm of lymph nodes of head, face and neck: Secondary | ICD-10-CM | POA: Diagnosis not present

## 2017-11-04 DIAGNOSIS — E119 Type 2 diabetes mellitus without complications: Secondary | ICD-10-CM | POA: Diagnosis not present

## 2017-11-04 DIAGNOSIS — R634 Abnormal weight loss: Secondary | ICD-10-CM | POA: Diagnosis not present

## 2017-11-04 DIAGNOSIS — Z87891 Personal history of nicotine dependence: Secondary | ICD-10-CM | POA: Insufficient documentation

## 2017-11-04 LAB — COMPREHENSIVE METABOLIC PANEL
ALT: 88 U/L — ABNORMAL HIGH (ref 17–63)
AST: 29 U/L (ref 15–41)
Albumin: 3.6 g/dL (ref 3.5–5.0)
Alkaline Phosphatase: 55 U/L (ref 38–126)
Anion gap: 8 (ref 5–15)
BUN: 31 mg/dL — ABNORMAL HIGH (ref 6–20)
CO2: 25 mmol/L (ref 22–32)
Calcium: 8.9 mg/dL (ref 8.9–10.3)
Chloride: 100 mmol/L — ABNORMAL LOW (ref 101–111)
Creatinine, Ser: 1.13 mg/dL (ref 0.61–1.24)
GFR calc Af Amer: >60 mL/min (ref >60)
GFR calc non Af Amer: >60 mL/min (ref >60)
Glucose, Bld: 247 mg/dL — ABNORMAL HIGH (ref 65–99)
Potassium: 4.6 mmol/L (ref 3.5–5.1)
Sodium: 133 mmol/L — ABNORMAL LOW (ref 135–145)
Total Bilirubin: 0.7 mg/dL (ref 0.3–1.2)
Total Protein: 6.9 g/dL (ref 6.5–8.1)

## 2017-11-04 LAB — CBC WITH DIFFERENTIAL/PLATELET
Basophils Absolute: 0 10*3/uL (ref 0–0.1)
Basophils Relative: 0 %
Eosinophils Absolute: 0 10*3/uL (ref 0–0.7)
Eosinophils Relative: 0 %
HCT: 45.5 % (ref 40.0–52.0)
Hemoglobin: 15.2 g/dL (ref 13.0–18.0)
Lymphocytes Relative: 8 %
Lymphs Abs: 1.1 10*3/uL (ref 1.0–3.6)
MCH: 28.4 pg (ref 26.0–34.0)
MCHC: 33.3 g/dL (ref 32.0–36.0)
MCV: 85.1 fL (ref 80.0–100.0)
Monocytes Absolute: 1.3 10*3/uL — ABNORMAL HIGH (ref 0.2–1.0)
Monocytes Relative: 10 %
Neutro Abs: 10.4 10*3/uL — ABNORMAL HIGH (ref 1.4–6.5)
Neutrophils Relative %: 82 %
Platelets: 194 10*3/uL (ref 150–440)
RBC: 5.35 MIL/uL (ref 4.40–5.90)
RDW: 15 % — ABNORMAL HIGH (ref 11.5–14.5)
WBC: 12.8 10*3/uL — ABNORMAL HIGH (ref 3.8–10.6)

## 2017-11-04 MED ORDER — POTASSIUM CHLORIDE 2 MEQ/ML IV SOLN
Freq: Once | INTRAVENOUS | Status: AC
  Administered 2017-11-04: 11:00:00 via INTRAVENOUS
  Filled 2017-11-04: qty 1000

## 2017-11-04 MED ORDER — SODIUM CHLORIDE 0.9 % IV SOLN
40.0000 mg/m2 | Freq: Once | INTRAVENOUS | Status: AC
  Administered 2017-11-04: 92 mg via INTRAVENOUS
  Filled 2017-11-04: qty 92

## 2017-11-04 MED ORDER — SODIUM CHLORIDE 0.9% FLUSH
10.0000 mL | Freq: Once | INTRAVENOUS | Status: AC
  Administered 2017-11-04: 10 mL via INTRAVENOUS
  Filled 2017-11-04: qty 10

## 2017-11-04 MED ORDER — SODIUM CHLORIDE 0.9 % IV SOLN
Freq: Once | INTRAVENOUS | Status: AC
  Administered 2017-11-04: 13:00:00 via INTRAVENOUS
  Filled 2017-11-04: qty 5

## 2017-11-04 MED ORDER — HEPARIN SOD (PORK) LOCK FLUSH 100 UNIT/ML IV SOLN
500.0000 [IU] | Freq: Once | INTRAVENOUS | Status: AC
  Administered 2017-11-04: 500 [IU] via INTRAVENOUS

## 2017-11-04 MED ORDER — PALONOSETRON HCL INJECTION 0.25 MG/5ML
0.2500 mg | Freq: Once | INTRAVENOUS | Status: AC
  Administered 2017-11-04: 0.25 mg via INTRAVENOUS
  Filled 2017-11-04: qty 5

## 2017-11-04 MED ORDER — SODIUM CHLORIDE 0.9 % IV SOLN
Freq: Once | INTRAVENOUS | Status: AC
  Administered 2017-11-04: 10:00:00 via INTRAVENOUS
  Filled 2017-11-04: qty 1000

## 2017-11-04 MED ORDER — HEPARIN SOD (PORK) LOCK FLUSH 100 UNIT/ML IV SOLN: 500.0000 [IU] | Freq: Once | INTRAVENOUS | Status: DC | PRN

## 2017-11-04 NOTE — Progress Notes (Signed)
Patient here for follow with labs and treatment today. He states that his low back and hips are hurting, but that this is chronic. He states that nothing has changed since his last visit.

## 2017-11-05 ENCOUNTER — Ambulatory Visit
Admission: RE | Admit: 2017-11-05 | Discharge: 2017-11-05 | Disposition: A | Discharge status: Home / Self Care | Payer: Federal, State, Local not specified - PPO | Source: Ambulatory Visit | Attending: Radiation Oncology | Admitting: Radiation Oncology

## 2017-11-05 DIAGNOSIS — C090 Malignant neoplasm of tonsillar fossa: Secondary | ICD-10-CM | POA: Diagnosis not present

## 2017-11-06 ENCOUNTER — Inpatient Hospital Stay: Payer: Federal, State, Local not specified - PPO

## 2017-11-06 ENCOUNTER — Ambulatory Visit
Admission: RE | Admit: 2017-11-06 | Discharge: 2017-11-06 | Disposition: A | Discharge status: Home / Self Care | Payer: Federal, State, Local not specified - PPO | Source: Ambulatory Visit | Attending: Radiation Oncology | Admitting: Radiation Oncology

## 2017-11-06 DIAGNOSIS — C090 Malignant neoplasm of tonsillar fossa: Secondary | ICD-10-CM | POA: Diagnosis not present

## 2017-11-06 NOTE — Progress Notes (Signed)
Nutrition Follow-up:  Patient with squamous cell carcinoma of oropharynx (right tonsil stage I), HPV positive.  Patient receiving chemotherapy and radiation therapy.    Met with patient and wife following radiation therapy.  Patient reports he is eating fine.  Some sore mouth but better. Reports no taste but still trying to eat.  Reports he is eating 3 meals per day. Reports ate chicken with mashed potatoes and gravy and macaroni and cheese last night for dinner.  Drinking glucerna shakes 1-2 per day.    No other nutrition impact symptoms reported at this time  Medications: reviewed  Labs: reviewed  Anthropometrics:   Weight decreased to 212 lb 11.9 oz from 236 lb on 11/5 initial assessment  10% weight loss in the last month, significant for time frame   NUTRITION DIAGNOSIS: Predicted suboptimal energy intake continues   MALNUTRITION DIAGNOSIS: likely with significant weight loss.  Patient did not give full 24 hour recall   INTERVENTION:   Discussed ways to increase calories and protein with significant weight loss with patient and wife.   Provided fact sheet on ways to help sore mouth Encouraged patient to continue drinking glucerna shakes for added calories and protein.   Offered appointment with patient and declined at this time.  Will call with questions or concerns    MONITORING, EVALUATION, GOAL: weight trends, intake   NEXT VISIT: patient declined and will notify me with questions or concerns  Johnye Kist B. Zenia Resides, [place], [place] Registered Dietitian [phone] (pager)

## 2017-11-07 ENCOUNTER — Ambulatory Visit
Admission: RE | Admit: 2017-11-07 | Discharge: 2017-11-07 | Disposition: A | Discharge status: Home / Self Care | Payer: Federal, State, Local not specified - PPO | Source: Ambulatory Visit | Attending: Radiation Oncology | Admitting: Radiation Oncology

## 2017-11-07 DIAGNOSIS — C090 Malignant neoplasm of tonsillar fossa: Secondary | ICD-10-CM | POA: Diagnosis not present

## 2017-11-10 ENCOUNTER — Ambulatory Visit: Payer: Federal, State, Local not specified - PPO

## 2017-11-11 ENCOUNTER — Ambulatory Visit: Payer: Federal, State, Local not specified - PPO

## 2017-11-11 ENCOUNTER — Inpatient Hospital Stay: Payer: Federal, State, Local not specified - PPO

## 2017-11-11 ENCOUNTER — Inpatient Hospital Stay (HOSPITAL_BASED_OUTPATIENT_CLINIC_OR_DEPARTMENT_OTHER): Payer: Federal, State, Local not specified - PPO | Admitting: Oncology

## 2017-11-11 ENCOUNTER — Other Ambulatory Visit: Payer: Self-pay

## 2017-11-11 ENCOUNTER — Other Ambulatory Visit: Payer: Self-pay | Admitting: *Deleted

## 2017-11-11 VITALS — BP 117/82 | HR 95 | Temp 98.4°F | Resp 18

## 2017-11-11 DIAGNOSIS — R21 Rash and other nonspecific skin eruption: Secondary | ICD-10-CM

## 2017-11-11 DIAGNOSIS — R112 Nausea with vomiting, unspecified: Secondary | ICD-10-CM

## 2017-11-11 DIAGNOSIS — C109 Malignant neoplasm of oropharynx, unspecified: Secondary | ICD-10-CM

## 2017-11-11 DIAGNOSIS — R5383 Other fatigue: Secondary | ICD-10-CM

## 2017-11-11 DIAGNOSIS — R531 Weakness: Secondary | ICD-10-CM

## 2017-11-11 DIAGNOSIS — R197 Diarrhea, unspecified: Secondary | ICD-10-CM | POA: Diagnosis not present

## 2017-11-11 DIAGNOSIS — R893 Abnormal level of substances chiefly nonmedicinal as to source in specimens from other organs, systems and tissues: Secondary | ICD-10-CM

## 2017-11-11 DIAGNOSIS — T451X5A Adverse effect of antineoplastic and immunosuppressive drugs, initial encounter: Secondary | ICD-10-CM

## 2017-11-11 DIAGNOSIS — D696 Thrombocytopenia, unspecified: Secondary | ICD-10-CM | POA: Diagnosis not present

## 2017-11-11 DIAGNOSIS — E860 Dehydration: Secondary | ICD-10-CM

## 2017-11-11 DIAGNOSIS — Y842 Radiological procedure and radiotherapy as the cause of abnormal reaction of the patient, or of later complication, without mention of misadventure at the time of the procedure: Secondary | ICD-10-CM

## 2017-11-11 DIAGNOSIS — Z86010 Personal history of colonic polyps: Secondary | ICD-10-CM

## 2017-11-11 DIAGNOSIS — Z87891 Personal history of nicotine dependence: Secondary | ICD-10-CM

## 2017-11-11 DIAGNOSIS — L589 Radiodermatitis, unspecified: Secondary | ICD-10-CM

## 2017-11-11 DIAGNOSIS — I480 Paroxysmal atrial fibrillation: Secondary | ICD-10-CM

## 2017-11-11 DIAGNOSIS — I4891 Unspecified atrial fibrillation: Secondary | ICD-10-CM

## 2017-11-11 DIAGNOSIS — J029 Acute pharyngitis, unspecified: Secondary | ICD-10-CM

## 2017-11-11 DIAGNOSIS — Z923 Personal history of irradiation: Secondary | ICD-10-CM

## 2017-11-11 DIAGNOSIS — K219 Gastro-esophageal reflux disease without esophagitis: Secondary | ICD-10-CM

## 2017-11-11 DIAGNOSIS — C770 Secondary and unspecified malignant neoplasm of lymph nodes of head, face and neck: Secondary | ICD-10-CM | POA: Diagnosis not present

## 2017-11-11 DIAGNOSIS — K1233 Oral mucositis (ulcerative) due to radiation: Secondary | ICD-10-CM

## 2017-11-11 DIAGNOSIS — E119 Type 2 diabetes mellitus without complications: Secondary | ICD-10-CM

## 2017-11-11 DIAGNOSIS — F419 Anxiety disorder, unspecified: Secondary | ICD-10-CM

## 2017-11-11 DIAGNOSIS — F329 Major depressive disorder, single episode, unspecified: Secondary | ICD-10-CM

## 2017-11-11 DIAGNOSIS — R634 Abnormal weight loss: Secondary | ICD-10-CM

## 2017-11-11 DIAGNOSIS — E871 Hypo-osmolality and hyponatremia: Secondary | ICD-10-CM

## 2017-11-11 DIAGNOSIS — I10 Essential (primary) hypertension: Secondary | ICD-10-CM

## 2017-11-11 DIAGNOSIS — Z95828 Presence of other vascular implants and grafts: Secondary | ICD-10-CM

## 2017-11-11 DIAGNOSIS — G8929 Other chronic pain: Secondary | ICD-10-CM

## 2017-11-11 DIAGNOSIS — E039 Hypothyroidism, unspecified: Secondary | ICD-10-CM

## 2017-11-11 LAB — CBC WITH DIFFERENTIAL/PLATELET
Basophils Absolute: 0 10*3/uL (ref 0–0.1)
Basophils Relative: 0 %
Eosinophils Absolute: 0 10*3/uL (ref 0–0.7)
Eosinophils Relative: 0 %
HCT: 44.3 % (ref 40.0–52.0)
Hemoglobin: 14.8 g/dL (ref 13.0–18.0)
Lymphocytes Relative: 6 %
Lymphs Abs: 0.6 10*3/uL — ABNORMAL LOW (ref 1.0–3.6)
MCH: 28.2 pg (ref 26.0–34.0)
MCHC: 33.4 g/dL (ref 32.0–36.0)
MCV: 84.7 fL (ref 80.0–100.0)
Monocytes Absolute: 0.8 10*3/uL (ref 0.2–1.0)
Monocytes Relative: 8 %
Neutro Abs: 8.6 10*3/uL — ABNORMAL HIGH (ref 1.4–6.5)
Neutrophils Relative %: 86 %
Platelets: 137 10*3/uL — ABNORMAL LOW (ref 150–440)
RBC: 5.23 MIL/uL (ref 4.40–5.90)
RDW: 15.7 % — ABNORMAL HIGH (ref 11.5–14.5)
WBC: 10 10*3/uL (ref 3.8–10.6)

## 2017-11-11 LAB — COMPREHENSIVE METABOLIC PANEL
ALT: 155 U/L — ABNORMAL HIGH (ref 17–63)
AST: 27 U/L (ref 15–41)
Albumin: 3.6 g/dL (ref 3.5–5.0)
Alkaline Phosphatase: 67 U/L (ref 38–126)
Anion gap: 10 (ref 5–15)
BUN: 21 mg/dL — ABNORMAL HIGH (ref 6–20)
CO2: 23 mmol/L (ref 22–32)
Calcium: 8.8 mg/dL — ABNORMAL LOW (ref 8.9–10.3)
Chloride: 99 mmol/L — ABNORMAL LOW (ref 101–111)
Creatinine, Ser: 0.98 mg/dL (ref 0.61–1.24)
GFR calc Af Amer: >60 mL/min (ref >60)
GFR calc non Af Amer: >60 mL/min (ref >60)
Glucose, Bld: 272 mg/dL — ABNORMAL HIGH (ref 65–99)
Potassium: 4.4 mmol/L (ref 3.5–5.1)
Sodium: 132 mmol/L — ABNORMAL LOW (ref 135–145)
Total Bilirubin: 1.6 mg/dL — ABNORMAL HIGH (ref 0.3–1.2)
Total Protein: 7.2 g/dL (ref 6.5–8.1)

## 2017-11-11 MED ORDER — SILVER SULFADIAZINE 1 % EX CREA: 1.0000 "application " | TOPICAL_CREAM | Freq: Two times a day (BID) | CUTANEOUS | 0 refills | Status: DC

## 2017-11-11 MED ORDER — DEXAMETHASONE SODIUM PHOSPHATE 10 MG/ML IJ SOLN
10.0000 mg | Freq: Once | INTRAMUSCULAR | Status: AC
  Administered 2017-11-11: 10 mg via INTRAVENOUS
  Filled 2017-11-11: qty 1

## 2017-11-11 MED ORDER — SODIUM CHLORIDE 0.9 % IV SOLN
INTRAVENOUS | Status: AC
  Administered 2017-11-11: 13:00:00 via INTRAVENOUS
  Filled 2017-11-11 (×2): qty 1000

## 2017-11-11 MED ORDER — HEPARIN SOD (PORK) LOCK FLUSH 100 UNIT/ML IV SOLN: 500.0000 [IU] | Freq: Once | INTRAVENOUS | Status: AC

## 2017-11-11 MED ORDER — SODIUM CHLORIDE 0.9 % IV SOLN: Freq: Once | INTRAVENOUS | Status: DC

## 2017-11-11 MED ORDER — ONDANSETRON HCL 4 MG/2ML IJ SOLN
8.0000 mg | Freq: Once | INTRAMUSCULAR | Status: AC
Start: 2017-11-11 — End: 2017-11-11
  Administered 2017-11-11: 8 mg via INTRAVENOUS
  Filled 2017-11-11: qty 4

## 2017-11-11 MED ORDER — DIPHENOXYLATE-ATROPINE 2.5-0.025 MG PO TABS: 1.0000 | ORAL_TABLET | Freq: Four times a day (QID) | ORAL | 0 refills | Status: DC | PRN

## 2017-11-11 NOTE — Progress Notes (Signed)
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 Patient is a 61 year old male who was found to have a right neck mass about 1 year ago when he underwent morphine pump placement. At that time he was seen by ENT and also went through antibiotic course but was not found to have a malignancy back then. Patient recently underwent a hemicolectomy by Dr. Jamal Collin as he was found to have a colonic polyp which was not resectable on colonoscopy. Shortly following the surgery patient again noticed right sided neck mass. He wasttreated with empiric antibiotics by Dr. Doy Hutching but the mass did not go away and he was referred to Dr. Tami Ribas. Patient was found to have tonsillar asymmetry on NPL exam and underwent CT neck  2. CT soft tissue of the neck. Multiple right-sided lymph nodes at level IIA and 2B between 1.5-1.8 cm in size which were suspicious in appearance.also noted to have started asymmetric soft tissue fullness in the oropharynx or on the right lateral wall in the right palatine tonsil region measuring up to 2.7 cm.  3. 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 This was followed by a PET CT scan which showed right palatine tonsillar mass with a maximum SUV of 8.5 compatible with malignancy. Several right level IIA lymph nodes are  present and are hypermetabolic, larger node measuring 1.6 cm in short axis. No evidence of metastatic spread to the chest abdomen and pelvis or visualized skeleton  5. Patient has been referred to [place] for further management. Currently patient reports some pain and discomfort in his right neck. Denies any difficulty swallowing. His appetite is good and he does not report any unintentional weight loss. He does have chronic pain for which she has a morphine pump in place  Interval history- Patient was last seen by Dr. Janese Banks on 11/04/2017 for cycle 5 of weekly cisplatin. Patient was doing well but continued to complain of radiation-induced mucositis and he continued his Magic mouthwash and hydrocodone. Patient also continued his morphine pump. He continues to have weight loss. He was given IV fluids and encouraged to take PO supplements.   Patient presents today with worsening diarrhea,weakness, fatigue and sore throat. Patient states that beginning 2 days after chemotherapy he develops diarrhea. It is becoming progressively worse each weekly chemo and he is now having 6-7 loose stools per day. He denies blood in stools or any distinct smell. Additionally he complains of a worsening rash due to radiation on the left side of his neck. He has not used any medicated lotions/ointments. He denies any fevers but admits to occasionally feeling "clammy". He notes a sore throat that began yesterday. He continues to have pain in his lower back but states this is "much better" with the use of the morphine pump.  ECOG FS:0 - Asymptomatic  Review of systems- Review of Systems  Constitutional: Positive for chills, malaise/fatigue and weight loss. Negative for fever.  HENT: Negative.  Eyes: Negative.   Respiratory: Negative for cough, sputum production, shortness of breath and wheezing.   Cardiovascular: Negative for chest pain and leg swelling.  Gastrointestinal: Positive for diarrhea. Negative for abdominal pain,  nausea and vomiting.  Genitourinary: Negative.   Musculoskeletal: Positive for back pain.  Skin: Positive for rash.  Neurological: Positive for weakness.  Endo/Heme/Allergies: Negative.   Psychiatric/Behavioral: Negative.      Current treatment- Cycle 5 weekly Cisplatin on 11/04/17. Cycle 5 scheduled for tomorrow.   Allergies  Allergen Reactions  . Biaxin [Clarithromycin] Other (See Comments)    Severe hiccups  . Ceftin [Cefuroxime Axetil] Other (See Comments)    Severe hiccups  . Penicillins Hives    Has patient had a PCN reaction causing immediate rash, facial/tongue/throat swelling, SOB or lightheadedness with hypotension: No Has patient had a PCN reaction causing severe rash involving mucus membranes or skin necrosis: unknown Has patient had a PCN reaction that required hospitalization: No Has patient had a PCN reaction occurring within the last 10 years: No If all of the above answers are "NO", then may proceed with Cephalosporin use.      Past Medical History:  Diagnosis Date  . Anxiety   . Arthritis   . Cancer ([place]) 08/21/2017  . Chronic pain 1996   due to work injury  . Colon polyp    adenomatous polyp ascending colon  . Cough    THAT WONT GO AWAY-PT TO SEE DR Doy Hutching ON 06-10-17 @ 4 PM  . Depression   . Diabetes mellitus without complication ([place])   . Dysrhythmia    AFIB X 1  . GERD (gastroesophageal reflux disease)   . Herniated lumbar intervertebral disc   . Hypertension   . Hypothyroidism   . Neck pain   . Neuromuscular disorder ([place])   . Presence of intrathecal pump    Dr. Mechele Dawley in [place], [place]     Past Surgical History:  Procedure Laterality Date  . COLONOSCOPY  2013   [place]  . COLONOSCOPY WITH PROPOFOL N/A 04/22/2017   Procedure: COLONOSCOPY WITH PROPOFOL;  Surgeon: Lollie Sails, MD;  Location: [facility] ENDOSCOPY;  Service: Endoscopy;  Laterality: N/A;  . IMAGE GUIDED SINUS SURGERY    . implanted morphine pump  2016  .  IR FLUORO GUIDE PORT INSERTION LEFT  09/26/2017  . LAPAROSCOPIC CHOLECYSTECTOMY  2013  . PARTIAL COLECTOMY Right 06/17/2017   Procedure: PARTIAL COLECTOMY;  Surgeon: Christene Lye, MD;  Location: [facility] ORS;  Service: General;  Laterality: Right;  . UMBILICAL HERNIA REPAIR  2013  . UMBILICAL HERNIA REPAIR  06/17/2017   Procedure: HERNIA REPAIR UMBILICAL ADULT;  Surgeon: Christene Lye, MD;  Location: [facility] ORS;  Service: General;;  . VASECTOMY      Social History   Socioeconomic History  . Marital status: Married    Spouse name: Not on file  . Number of children: Not on file  . Years of education: Not on file  . Highest education level: Not on file  Social Needs  . Financial resource strain: Not on file  . Food insecurity - worry: Not on file  . Food insecurity - inability: Not on file  . Transportation needs - medical: Not on file  . Transportation needs - non-medical: Not on file  Occupational History  . Not on file  Tobacco Use  . Smoking status: Former Smoker    Years: 20.00    Types: Cigarettes    Last attempt to quit: 12/03/1999  Years since quitting: 17.9  . Smokeless tobacco: Never Used  Substance and Sexual Activity  . Alcohol use: No  . Drug use: No  . Sexual activity: Yes  Other Topics Concern  . Not on file  Social History Narrative  . Not on file    Family History  Problem Relation Age of Onset  . Breast cancer Cousin 57  . Cancer - Colon Neg Hx   . Prostate cancer Neg Hx      Current Outpatient Medications:  .  amitriptyline (ELAVIL) 100 MG tablet, Take 50 mg by mouth at bedtime as needed for sleep. , Disp: , Rfl:  .  apixaban (ELIQUIS) 5 MG TABS tablet, Take 1 tablet 2 (two) times daily by mouth., Disp: , Rfl:  .  Baclofen 5 MG TABS, TAKE 1 TABLET BY MOUTH THREE TIMES DAILY FOR HICCUPS, Disp: 270 tablet, Rfl: 0 .  Cholecalciferol (VITAMIN D3) 5000 units TABS, Take 5,000 Units by mouth daily. , Disp: , Rfl:  .  citalopram (CELEXA) 40  MG tablet, Take 40 mg by mouth every morning. , Disp: , Rfl:  .  dexamethasone (DECADRON) 4 MG tablet, Take 2 tablets by mouth once a day on the day after chemotherapy and then take 2 tablets two times a day for 2 days. Take with food., Disp: 30 tablet, Rfl: 1 .  diltiazem (CARDIZEM CD) 240 MG 24 hr capsule, Take 1 capsule daily by mouth., Disp: , Rfl:  .  esomeprazole (NEXIUM) 40 MG capsule, Take 40 mg by mouth daily as needed. , Disp: , Rfl:  .  fluconazole (DIFLUCAN) 100 MG tablet, Take 1 tablet (100 mg total) daily by mouth., Disp: 7 tablet, Rfl: 0 .  glyBURIDE-metformin (GLUCOVANCE) 5-500 MG tablet, Take 0.5 tablets by mouth See admin instructions. Takes 0.5 tablet twice daily in morning and bedtime and takes a 3rd dose only if needed for elevated blood sugar, Disp: , Rfl:  .  HYDROcodone-acetaminophen (NORCO) 7.5-325 MG tablet, Take 1 tablet by mouth every 6 (six) hours as needed for moderate pain. , Disp: , Rfl:  .  Insulin Glargine (BASAGLAR KWIKPEN) 100 UNIT/ML SOPN, Inject 26 Units 2 (two) times daily into the skin. , Disp: , Rfl:  .  KRILL OIL PO, Take 1 capsule by mouth daily. , Disp: , Rfl:  .  lidocaine-prilocaine (EMLA) cream, Apply to affected area once, Disp: 30 g, Rfl: 3 .  LORazepam (ATIVAN) 0.5 MG tablet, Take 1 tablet (0.5 mg total) by mouth every 6 (six) hours as needed (Nausea or vomiting)., Disp: 30 tablet, Rfl: 0 .  MORPHINE SULFATE IJ, Inject as directed. Morphine 4mg /ml epidural pump. 1.4897 mg/day, Disp: , Rfl:  .  niacin 500 MG tablet, Take 500 mg by mouth at bedtime., Disp: , Rfl:  .  ondansetron (ZOFRAN) 8 MG tablet, Take 1 tablet (8 mg total) by mouth 2 (two) times daily as needed. Start on the third day after chemotherapy., Disp: 30 tablet, Rfl: 1 .  oxymetazoline (AFRIN) 0.05 % nasal spray, Place 1 spray into both nostrils 2 (two) times daily as needed for congestion., Disp: , Rfl:  .  pregabalin (LYRICA) 150 MG capsule, Take 150 mg by mouth 2 (two) times daily.,  Disp: , Rfl:  .  prochlorperazine (COMPAZINE) 10 MG tablet, Take 1 tablet (10 mg total) by mouth every 6 (six) hours as needed (Nausea or vomiting)., Disp: 30 tablet, Rfl: 1 .  sucralfate (CARAFATE) 1 g tablet, Take 1 tablet (1 g total)  3 (three) times daily by mouth. Dissolve in 2-3 tbsp warm water, swish and swallow., Disp: 90 tablet, Rfl: 3 .  thyroid (ARMOUR) 32.5 MG tablet, Take 32.5 mg by mouth every morning. , Disp: , Rfl:  .  tiZANidine (ZANAFLEX) 4 MG tablet, Take 4 mg by mouth every 8 (eight) hours as needed for muscle spasms., Disp: , Rfl:  .  vitamin E (VITAMIN E) 400 UNIT capsule, Take 400 Units by mouth daily., Disp: , Rfl:  .  zinc gluconate 50 MG tablet, Take 50 mg by mouth daily., Disp: , Rfl:  .  dexamethasone (DECADRON) 4 MG tablet, Take 1 tablet (4 mg total) by mouth daily., Disp: 30 tablet, Rfl: 0 .  diphenoxylate-atropine (LOMOTIL) 2.5-0.025 MG tablet, Take 1 tablet by mouth 4 (four) times daily as needed for diarrhea or loose stools., Disp: 60 tablet, Rfl: 0 .  silver sulfADIAZINE (SILVADENE) 1 % cream, Apply 1 application topically 2 (two) times daily., Disp: 50 g, Rfl: 0 No current facility-administered medications for this visit.   Facility-Administered Medications Ordered in Other Visits:  .  0.9 %  sodium chloride infusion, , Intravenous, Continuous, Burns, Wandra Feinstein, NP, Stopped at 11/11/17 1336 .  0.9 %  sodium chloride infusion, , Intravenous, Once, Sindy Guadeloupe, MD .  CISplatin (PLATINOL) 92 mg in sodium chloride 0.9 % 250 mL chemo infusion, 40 mg/m2 (Treatment Plan Recorded), Intravenous, Once, Sindy Guadeloupe, MD .  fosaprepitant (EMEND) 150 mg, dexamethasone (DECADRON) 12 mg in sodium chloride 0.9 % 145 mL IVPB, , Intravenous, Once, Sindy Guadeloupe, MD .  heparin lock flush 100 unit/mL, 500 Units, Intravenous, Once, Sindy Guadeloupe, MD .  heparin lock flush 100 unit/mL, 500 Units, Intravenous, Once, Sindy Guadeloupe, MD .  palonosetron (ALOXI) injection 0.25 mg,  0.25 mg, Intravenous, Once, Sindy Guadeloupe, MD .  sodium chloride flush (NS) 0.9 % injection 10 mL, 10 mL, Intravenous, PRN, Sindy Guadeloupe, MD, 10 mL at 11/12/17 4235  Physical exam:  Vitals:   11/11/17 1157  BP: 117/82  Pulse: 95  Resp: 18  Temp: 98.4 F (36.9 C)  TempSrc: Tympanic   Physical Exam  Constitutional: He is oriented to person, place, and time and well-developed, well-nourished, and in no distress. Vital signs are normal. He appears dehydrated.  HENT:  Head: Normocephalic and atraumatic.  Eyes: Pupils are equal, round, and reactive to light.  Neck: Normal range of motion. Neck supple. Erythema present.    Cardiovascular: Normal rate, regular rhythm and normal heart sounds.  Pulmonary/Chest: Effort normal and breath sounds normal.  Abdominal: Soft. Normal appearance. Bowel sounds are hyperactive.  Musculoskeletal: Normal range of motion.  Neurological: He is alert and oriented to person, place, and time.  Skin: Skin is warm and dry. Burn noted. There is erythema.        CMP Latest Ref Rng & Units 11/11/2017  Glucose 65 - 99 mg/dL 272(H)  BUN 6 - 20 mg/dL 21(H)  Creatinine 0.61 - 1.24 mg/dL 0.98  Sodium 135 - 145 mmol/L 132(L)  Potassium 3.5 - 5.1 mmol/L 4.4  Chloride 101 - 111 mmol/L 99(L)  CO2 22 - 32 mmol/L 23  Calcium 8.9 - 10.3 mg/dL 8.8(L)  Total Protein 6.5 - 8.1 g/dL 7.2  Total Bilirubin 0.3 - 1.2 mg/dL 1.6(H)  Alkaline Phos 38 - 126 U/L 67  AST 15 - 41 U/L 27  ALT 17 - 63 U/L 155(H)   CBC Latest Ref Rng & Units 11/12/2017  WBC 3.8 - 10.6 K/uL 9.0  Hemoglobin 13.0 - 18.0 g/dL 13.3  Hematocrit 40.0 - 52.0 % 39.9(L)  Platelets 150 - 440 K/uL 113(L)    No images are attached to the encounter.  No results found.  Assessment and plan- Patient is a 61 y.o. male he presents for fatigue, weakness, sore throat and diarrhea. On examination patient is pale and appears dehydrated. Vital signs are stable. Patient is afebrile. Patient has hyperactive  bowel sounds. No organomegaly. Rash noted on left side of neck. Erythema and Xerosis with several open areas noted. No peripheral edema. Lung sounds clear with normal heart sounds. No sinus pressure/congestion. Dry mucous membranes. Labs reveal hyponatremia (132), thrombocytopenia (137) and slightly elevated kidney function (BUN 21).   1. Dehydration/hyponatremia/Elevated BUN: Give 1 liter NaCl. Encouraged patient to drink plenty of fluids 2. Diarrhea: Rx Lomotil every 4 hours when necessary. Encourage patient to rotate Lomotil and Imodium as needed. 3. Nausea: Give 8 mg Zofran IV and 10 mg Decadron.  4. Weakness/fatigue: Iv Decadron. Encouraged patient to stay active and to continue to push fluids and oral supplements as able. 5. Sore throat: Patient is afebrile at this time. Labs appear okay. Educated patient on neutropenic precautions. Warm salt water gargles. 6. 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  7. Thrombocytopenia: Chemo induced. Follow. 8. RTC as scheduled tomorrow to see Dr. Janese Banks with labs, radiation and treatment.    Visit Diagnosis 1. Oropharyngeal cancer (HCC)   2. Dehydration   3. Chemotherapy induced nausea and vomiting   4. Hyponatremia   5. Radiation dermatitis     Patient expressed understanding and was in agreement with this plan. He also understands that He can call clinic at any time with any questions, concerns, or complaints.   Greater than 50% was spent in counseling and coordination of care with this patient including but not limited to discussion of the relevant topics above (See A&P) including, but not limited to diagnosis and management of acute and chronic medical conditions.    Faythe Casa, AGNP-C [facility]- [phone] Pager- [phone] 11/12/2017 10:44 AM

## 2017-11-11 NOTE — Progress Notes (Signed)
Patient here as an acute add on for diarrhea and weakness. He states that he has been having diarrhea on and off since his last chemo treatment. He is feeling very weak and fatigued today.

## 2017-11-12 ENCOUNTER — Inpatient Hospital Stay: Payer: Federal, State, Local not specified - PPO

## 2017-11-12 ENCOUNTER — Ambulatory Visit
Admission: RE | Admit: 2017-11-12 | Discharge: 2017-11-12 | Disposition: A | Discharge status: Home / Self Care | Payer: Federal, State, Local not specified - PPO | Source: Ambulatory Visit | Attending: Radiation Oncology | Admitting: Radiation Oncology

## 2017-11-12 ENCOUNTER — Other Ambulatory Visit: Payer: Self-pay | Admitting: *Deleted

## 2017-11-12 VITALS — BP 120/80 | HR 85 | Temp 98.1°F | Resp 18

## 2017-11-12 DIAGNOSIS — C090 Malignant neoplasm of tonsillar fossa: Secondary | ICD-10-CM | POA: Diagnosis not present

## 2017-11-12 DIAGNOSIS — C109 Malignant neoplasm of oropharynx, unspecified: Secondary | ICD-10-CM

## 2017-11-12 LAB — CBC WITH DIFFERENTIAL/PLATELET
Basophils Absolute: 0 10*3/uL (ref 0–0.1)
Basophils Relative: 0 %
Eosinophils Absolute: 0 10*3/uL (ref 0–0.7)
Eosinophils Relative: 0 %
HCT: 39.9 % — ABNORMAL LOW (ref 40.0–52.0)
Hemoglobin: 13.3 g/dL (ref 13.0–18.0)
Lymphocytes Relative: 7 %
Lymphs Abs: 0.6 10*3/uL — ABNORMAL LOW (ref 1.0–3.6)
MCH: 28.5 pg (ref 26.0–34.0)
MCHC: 33.2 g/dL (ref 32.0–36.0)
MCV: 85.9 fL (ref 80.0–100.0)
Monocytes Absolute: 0.9 10*3/uL (ref 0.2–1.0)
Monocytes Relative: 10 %
Neutro Abs: 7.5 10*3/uL — ABNORMAL HIGH (ref 1.4–6.5)
Neutrophils Relative %: 83 %
Platelets: 113 10*3/uL — ABNORMAL LOW (ref 150–440)
RBC: 4.65 MIL/uL (ref 4.40–5.90)
RDW: 15.7 % — ABNORMAL HIGH (ref 11.5–14.5)
WBC: 9 10*3/uL (ref 3.8–10.6)

## 2017-11-12 MED ORDER — FOSAPREPITANT DIMEGLUMINE INJECTION 150 MG
Freq: Once | INTRAVENOUS | Status: AC
  Administered 2017-11-12: 12:00:00 via INTRAVENOUS
  Filled 2017-11-12: qty 5

## 2017-11-12 MED ORDER — DEXAMETHASONE 4 MG PO TABS: 4.0000 mg | ORAL_TABLET | Freq: Every day | ORAL | 0 refills | Status: DC

## 2017-11-12 MED ORDER — POTASSIUM CHLORIDE 2 MEQ/ML IV SOLN
Freq: Once | INTRAVENOUS | Status: AC
  Administered 2017-11-12: 10:00:00 via INTRAVENOUS
  Filled 2017-11-12: qty 1000

## 2017-11-12 MED ORDER — SODIUM CHLORIDE 0.9% FLUSH
10.0000 mL | INTRAVENOUS | Status: DC | PRN
  Administered 2017-11-12: 10 mL via INTRAVENOUS
  Filled 2017-11-12: qty 10

## 2017-11-12 MED ORDER — HEPARIN SOD (PORK) LOCK FLUSH 100 UNIT/ML IV SOLN
500.0000 [IU] | Freq: Once | INTRAVENOUS | Status: AC
  Administered 2017-11-12: 500 [IU] via INTRAVENOUS
  Filled 2017-11-12: qty 5

## 2017-11-12 MED ORDER — SODIUM CHLORIDE 0.9 % IV SOLN
40.0000 mg/m2 | Freq: Once | INTRAVENOUS | Status: AC
  Administered 2017-11-12: 92 mg via INTRAVENOUS
  Filled 2017-11-12: qty 92

## 2017-11-12 MED ORDER — PALONOSETRON HCL INJECTION 0.25 MG/5ML
0.2500 mg | Freq: Once | INTRAVENOUS | Status: AC
  Administered 2017-11-12: 0.25 mg via INTRAVENOUS
  Filled 2017-11-12: qty 5

## 2017-11-12 MED ORDER — SODIUM CHLORIDE 0.9 % IV SOLN
Freq: Once | INTRAVENOUS | Status: AC
Start: 2017-11-12 — End: 2017-11-12
  Administered 2017-11-12: 10:00:00 via INTRAVENOUS
  Filled 2017-11-12: qty 1000

## 2017-11-13 ENCOUNTER — Ambulatory Visit
Admission: RE | Admit: 2017-11-13 | Discharge: 2017-11-13 | Disposition: A | Discharge status: Home / Self Care | Payer: Federal, State, Local not specified - PPO | Source: Ambulatory Visit | Attending: Radiation Oncology | Admitting: Radiation Oncology

## 2017-11-13 DIAGNOSIS — C090 Malignant neoplasm of tonsillar fossa: Secondary | ICD-10-CM | POA: Diagnosis not present

## 2017-11-14 ENCOUNTER — Ambulatory Visit
Admission: RE | Admit: 2017-11-14 | Discharge: 2017-11-14 | Disposition: A | Discharge status: Home / Self Care | Payer: Federal, State, Local not specified - PPO | Source: Ambulatory Visit | Attending: Radiation Oncology | Admitting: Radiation Oncology

## 2017-11-14 DIAGNOSIS — C090 Malignant neoplasm of tonsillar fossa: Secondary | ICD-10-CM | POA: Diagnosis not present

## 2017-11-17 ENCOUNTER — Ambulatory Visit: Payer: Federal, State, Local not specified - PPO

## 2017-11-17 ENCOUNTER — Ambulatory Visit
Admission: RE | Admit: 2017-11-17 | Discharge: 2017-11-17 | Disposition: A | Discharge status: Home / Self Care | Payer: Federal, State, Local not specified - PPO | Source: Ambulatory Visit | Attending: Radiation Oncology | Admitting: Radiation Oncology

## 2017-11-17 ENCOUNTER — Other Ambulatory Visit: Payer: Self-pay | Admitting: *Deleted

## 2017-11-17 DIAGNOSIS — C090 Malignant neoplasm of tonsillar fossa: Secondary | ICD-10-CM | POA: Diagnosis not present

## 2017-11-17 MED ORDER — AZITHROMYCIN 250 MG PO TABS: ORAL_TABLET | ORAL | 0 refills | Status: DC

## 2017-11-17 NOTE — Progress Notes (Signed)
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 Patient is a 61 year old male who was found to have a right neck mass about 1 year ago when he underwent morphine pump placement. At that time he was seen by ENT and also went through antibiotic course but was not found to have a malignancy back then. Patient recently underwent a hemicolectomy by Dr. Jamal Collin as he was found to have a colonic polyp which was not resectable on colonoscopy. Shortly following the surgery patient again noticed right sided neck mass. He wasttreated with empiric antibiotics by Dr. Doy Hutching but the mass did not go away and he was referred to Dr. Tami Ribas. Patient was found to have tonsillar asymmetry on NPL exam and underwent CT neck  2. CT soft tissue of the neck. Multiple right-sided lymph nodes at level IIA and 2B between 1.5-1.8 cm in size which were suspicious in appearance.also noted to have started asymmetric soft tissue fullness in the oropharynx or on the right lateral wall in the right palatine tonsil region measuring up to 2.7 cm.  3. 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 This was followed by a PET CT scan which showed right palatine tonsillar mass with a maximum SUV of 8.5 compatible with  malignancy. Several right level IIA lymph nodes are present and are hypermetabolic, larger node measuring 1.6 cm in short axis. No evidence of metastatic spread to the chest abdomen and pelvis or visualized skeleton  5. Patient has been referred to [place] for further management. Currently patient reports some pain and discomfort in his right neck. Denies any difficulty swallowing. His appetite is good and he does not report any unintentional weight loss. He does have chronic pain for which he has a morphine pump in place   Interval history- Mouth pain is improving.  He does complain of some sinus congestion as well as redness in his left eye which started today.  Weight stable over the last 2 weeks.  He has been prescribed Z-Pak by radiation oncology.  He is continuing his oral intake without significant issues  ECOG PS- 1 Pain scale- 0 Opioid associated constipation- no  Review of systems- Review of Systems  Constitutional: Positive for malaise/fatigue. Negative for chills, fever and weight loss.  HENT: Negative for congestion, ear discharge and nosebleeds.   Eyes: Negative for blurred vision.  Respiratory: Negative for cough, hemoptysis, sputum production, shortness of breath and wheezing.   Cardiovascular: Negative for chest pain, palpitations, orthopnea and claudication.  Gastrointestinal: Negative for abdominal pain, blood in stool, constipation, diarrhea, heartburn, melena, nausea and vomiting.  Genitourinary: Negative for dysuria, flank pain, frequency, hematuria and urgency.  Musculoskeletal: Negative for back pain, joint pain and myalgias.  Skin: Negative for rash.  Neurological: Negative for dizziness, tingling, focal weakness, seizures, weakness and headaches.  Endo/Heme/Allergies: Does not bruise/bleed easily.  Psychiatric/Behavioral: Negative for depression and suicidal ideas. The patient does not  have insomnia.       Allergies  Allergen Reactions  . Biaxin [Clarithromycin]  Other (See Comments)    Severe hiccups  . Ceftin [Cefuroxime Axetil] Other (See Comments)    Severe hiccups  . Penicillins Hives    Has patient had a PCN reaction causing immediate rash, facial/tongue/throat swelling, SOB or lightheadedness with hypotension: No Has patient had a PCN reaction causing severe rash involving mucus membranes or skin necrosis: unknown Has patient had a PCN reaction that required hospitalization: No Has patient had a PCN reaction occurring within the last 10 years: No If all of the above answers are "NO", then may proceed with Cephalosporin use.      Past Medical History:  Diagnosis Date  . Anxiety   . Arthritis   . Cancer ([place]) 08/21/2017  . Chronic pain 1996   due to work injury  . Colon polyp    adenomatous polyp ascending colon  . Cough    THAT WONT GO AWAY-PT TO SEE DR Doy Hutching ON 06-10-17 @ 4 PM  . Depression   . Diabetes mellitus without complication ([place])   . Dysrhythmia    AFIB X 1  . GERD (gastroesophageal reflux disease)   . Herniated lumbar intervertebral disc   . Hypertension   . Hypothyroidism   . Neck pain   . Neuromuscular disorder ([place])   . Presence of intrathecal pump    Dr. Mechele Dawley in [place], [place]     Past Surgical History:  Procedure Laterality Date  . COLONOSCOPY  2013   [place]  . COLONOSCOPY WITH PROPOFOL N/A 04/22/2017   Procedure: COLONOSCOPY WITH PROPOFOL;  Surgeon: Lollie Sails, MD;  Location: [facility] ENDOSCOPY;  Service: Endoscopy;  Laterality: N/A;  . IMAGE GUIDED SINUS SURGERY    . implanted morphine pump  2016  . IR FLUORO GUIDE PORT INSERTION LEFT  09/26/2017  . LAPAROSCOPIC CHOLECYSTECTOMY  2013  . PARTIAL COLECTOMY Right 06/17/2017   Procedure: PARTIAL COLECTOMY;  Surgeon: Christene Lye, MD;  Location: [facility] ORS;  Service: General;  Laterality: Right;  . UMBILICAL HERNIA REPAIR  2013  . UMBILICAL HERNIA REPAIR  06/17/2017   Procedure: HERNIA REPAIR UMBILICAL ADULT;  Surgeon:  Christene Lye, MD;  Location: [facility] ORS;  Service: General;;  . VASECTOMY      Social History   Socioeconomic History  . Marital status: Married    Spouse name: Not on file  . Number of children: Not on file  . Years of education: Not on file  . Highest education level: Not on file  Social Needs  . Financial resource strain: Not on file  . Food insecurity - worry: Not on file  . Food insecurity - inability: Not on file  . Transportation needs - medical: Not on file  . Transportation needs - non-medical: Not on file  Occupational History  . Not on file  Tobacco Use  . Smoking status: Former Smoker    Years: 20.00    Types: Cigarettes    Last attempt to quit: 12/03/1999    Years since quitting: 17.9  . Smokeless tobacco: Never Used  Substance and Sexual Activity  . Alcohol use: No  . Drug use: No  . Sexual activity: Yes  Other Topics Concern  . Not on file  Social History Narrative  . Not on file    Family History  Problem Relation Age of Onset  . Breast cancer Cousin 12  . Cancer - Colon Neg Hx   .  Prostate cancer Neg Hx      Current Outpatient Medications:  .  amitriptyline (ELAVIL) 100 MG tablet, Take 50 mg by mouth at bedtime as needed for sleep. , Disp: , Rfl:  .  apixaban (ELIQUIS) 5 MG TABS tablet, Take 1 tablet 2 (two) times daily by mouth., Disp: , Rfl:  .  azithromycin (ZITHROMAX Z-PAK) 250 MG tablet, 1 Z-pack; follow package directions, Disp: 6 each, Rfl: 0 .  Baclofen 5 MG TABS, TAKE 1 TABLET BY MOUTH THREE TIMES DAILY FOR HICCUPS, Disp: 270 tablet, Rfl: 0 .  Cholecalciferol (VITAMIN D3) 5000 units TABS, Take 5,000 Units by mouth daily. , Disp: , Rfl:  .  citalopram (CELEXA) 40 MG tablet, Take 40 mg by mouth every morning. , Disp: , Rfl:  .  dexamethasone (DECADRON) 4 MG tablet, Take 2 tablets by mouth once a day on the day after chemotherapy and then take 2 tablets two times a day for 2 days. Take with food., Disp: 30 tablet, Rfl: 1 .   dexamethasone (DECADRON) 4 MG tablet, Take 1 tablet (4 mg total) by mouth daily., Disp: 30 tablet, Rfl: 0 .  diltiazem (CARDIZEM CD) 240 MG 24 hr capsule, Take 1 capsule daily by mouth., Disp: , Rfl:  .  diphenoxylate-atropine (LOMOTIL) 2.5-0.025 MG tablet, Take 1 tablet by mouth 4 (four) times daily as needed for diarrhea or loose stools., Disp: 60 tablet, Rfl: 0 .  esomeprazole (NEXIUM) 40 MG capsule, Take 40 mg by mouth daily as needed. , Disp: , Rfl:  .  fluconazole (DIFLUCAN) 100 MG tablet, Take 1 tablet (100 mg total) daily by mouth., Disp: 7 tablet, Rfl: 0 .  glyBURIDE-metformin (GLUCOVANCE) 5-500 MG tablet, Take 0.5 tablets by mouth See admin instructions. Takes 0.5 tablet twice daily in morning and bedtime and takes a 3rd dose only if needed for elevated blood sugar, Disp: , Rfl:  .  HYDROcodone-acetaminophen (NORCO) 7.5-325 MG tablet, Take 1 tablet by mouth every 6 (six) hours as needed for moderate pain. , Disp: , Rfl:  .  Insulin Glargine (BASAGLAR KWIKPEN) 100 UNIT/ML SOPN, Inject 26 Units 2 (two) times daily into the skin. , Disp: , Rfl:  .  KRILL OIL PO, Take 1 capsule by mouth daily. , Disp: , Rfl:  .  lidocaine-prilocaine (EMLA) cream, Apply to affected area once, Disp: 30 g, Rfl: 3 .  LORazepam (ATIVAN) 0.5 MG tablet, Take 1 tablet (0.5 mg total) by mouth every 6 (six) hours as needed (Nausea or vomiting)., Disp: 30 tablet, Rfl: 0 .  MORPHINE SULFATE IJ, Inject as directed. Morphine 4mg /ml epidural pump. 1.4897 mg/day, Disp: , Rfl:  .  niacin 500 MG tablet, Take 500 mg by mouth at bedtime., Disp: , Rfl:  .  ondansetron (ZOFRAN) 8 MG tablet, Take 1 tablet (8 mg total) by mouth 2 (two) times daily as needed. Start on the third day after chemotherapy., Disp: 30 tablet, Rfl: 1 .  oxymetazoline (AFRIN) 0.05 % nasal spray, Place 1 spray into both nostrils 2 (two) times daily as needed for congestion., Disp: , Rfl:  .  pregabalin (LYRICA) 150 MG capsule, Take 150 mg by mouth 2 (two) times  daily., Disp: , Rfl:  .  prochlorperazine (COMPAZINE) 10 MG tablet, Take 1 tablet (10 mg total) by mouth every 6 (six) hours as needed (Nausea or vomiting)., Disp: 30 tablet, Rfl: 1 .  silver sulfADIAZINE (SILVADENE) 1 % cream, Apply 1 application topically 2 (two) times daily., Disp: 50 g, Rfl: 0 .  sucralfate (CARAFATE) 1 g tablet, Take 1 tablet (1 g total) 3 (three) times daily by mouth. Dissolve in 2-3 tbsp warm water, swish and swallow., Disp: 90 tablet, Rfl: 3 .  thyroid (ARMOUR) 32.5 MG tablet, Take 32.5 mg by mouth every morning. , Disp: , Rfl:  .  tiZANidine (ZANAFLEX) 4 MG tablet, Take 4 mg by mouth every 8 (eight) hours as needed for muscle spasms., Disp: , Rfl:  .  vitamin E (VITAMIN E) 400 UNIT capsule, Take 400 Units by mouth daily., Disp: , Rfl:  .  zinc gluconate 50 MG tablet, Take 50 mg by mouth daily., Disp: , Rfl:  No current facility-administered medications for this visit.   Facility-Administered Medications Ordered in Other Visits:  .  0.9 %  sodium chloride infusion, , Intravenous, Continuous, Burns, Wandra Feinstein, NP, Stopped at 11/11/17 1336 .  heparin lock flush 100 unit/mL, 500 Units, Intravenous, Once, Sindy Guadeloupe, MD  Physical exam:  Vitals:   11/18/17 1036  BP: 117/82  Pulse: 87  Resp: 18  Temp: (!) 97.2 F (36.2 C)  TempSrc: Tympanic  Weight: 212 lb (96.2 kg)   Physical Exam  Constitutional: He is oriented to person, place, and time and well-developed, well-nourished, and in no distress.  HENT:  Head: Normocephalic and atraumatic.  Mouth/Throat: Oropharynx is clear and moist.  Eyes: EOM are normal. Pupils are equal, round, and reactive to light.  Neck: Normal range of motion.  Cardiovascular: Normal rate, regular rhythm and normal heart sounds.  Pulmonary/Chest: Effort normal and breath sounds normal.  Abdominal: Soft. Bowel sounds are normal.  Neurological: He is alert and oriented to person, place, and time.  Skin: Skin is warm and dry.     CMP  Latest Ref Rng & Units 11/18/2017  Glucose 65 - 99 mg/dL 110(H)  BUN 6 - 20 mg/dL 36(H)  Creatinine 0.61 - 1.24 mg/dL 0.91  Sodium 135 - 145 mmol/L 135  Potassium 3.5 - 5.1 mmol/L 3.9  Chloride 101 - 111 mmol/L 99(L)  CO2 22 - 32 mmol/L 27  Calcium 8.9 - 10.3 mg/dL 8.9  Total Protein 6.5 - 8.1 g/dL 6.7  Total Bilirubin 0.3 - 1.2 mg/dL 1.0  Alkaline Phos 38 - 126 U/L 57  AST 15 - 41 U/L 18  ALT 17 - 63 U/L 35   CBC Latest Ref Rng & Units 11/18/2017  WBC 3.8 - 10.6 K/uL 6.9  Hemoglobin 13.0 - 18.0 g/dL 13.2  Hematocrit 40.0 - 52.0 % 39.7(L)  Platelets 150 - 440 K/uL 148(L)      Assessment and plan- Patient is a 61 y.o. male with SCC of oropharynx Stage I T2N1M0 here for on treatment assessment prior to cycle7of weekly cisplatin/RT  Counts okay to proceed with cycle #7 of weekly cisplatin tomorrow.  This will be his last chemotherapy.  He completes his radiation on 11/28/2017.  I will plan to get repeat PET/CT scan on 01/27/2018 which would be 8 weeks after his radiation and discuss his case at the tumor board as well  I will see him back in 3 weeks time with CBC and CMP to see how he is doing in terms of his nutritional intake and weight loss     Visit Diagnosis 1. Oropharyngeal cancer ([place])   2. Encounter for antineoplastic chemotherapy      Dr. Randa Evens, MD, MPH [facility] Pager- [phone] 11/18/2017 10:59 AM

## 2017-11-18 ENCOUNTER — Inpatient Hospital Stay (HOSPITAL_BASED_OUTPATIENT_CLINIC_OR_DEPARTMENT_OTHER): Payer: Federal, State, Local not specified - PPO | Admitting: Oncology

## 2017-11-18 ENCOUNTER — Other Ambulatory Visit: Payer: Self-pay | Admitting: *Deleted

## 2017-11-18 ENCOUNTER — Encounter: Payer: Self-pay | Admitting: Oncology

## 2017-11-18 ENCOUNTER — Ambulatory Visit: Payer: Federal, State, Local not specified - PPO

## 2017-11-18 ENCOUNTER — Ambulatory Visit
Admission: RE | Admit: 2017-11-18 | Discharge: 2017-11-18 | Disposition: A | Discharge status: Home / Self Care | Payer: Federal, State, Local not specified - PPO | Source: Ambulatory Visit | Attending: Radiation Oncology | Admitting: Radiation Oncology

## 2017-11-18 ENCOUNTER — Inpatient Hospital Stay: Payer: Federal, State, Local not specified - PPO

## 2017-11-18 VITALS — BP 117/82 | HR 87 | Temp 97.2°F | Resp 18 | Wt 212.0 lb

## 2017-11-18 DIAGNOSIS — Z125 Encounter for screening for malignant neoplasm of prostate: Secondary | ICD-10-CM

## 2017-11-18 DIAGNOSIS — C770 Secondary and unspecified malignant neoplasm of lymph nodes of head, face and neck: Secondary | ICD-10-CM

## 2017-11-18 DIAGNOSIS — E119 Type 2 diabetes mellitus without complications: Secondary | ICD-10-CM | POA: Diagnosis not present

## 2017-11-18 DIAGNOSIS — I4891 Unspecified atrial fibrillation: Secondary | ICD-10-CM

## 2017-11-18 DIAGNOSIS — Z95828 Presence of other vascular implants and grafts: Secondary | ICD-10-CM

## 2017-11-18 DIAGNOSIS — I10 Essential (primary) hypertension: Secondary | ICD-10-CM

## 2017-11-18 DIAGNOSIS — K219 Gastro-esophageal reflux disease without esophagitis: Secondary | ICD-10-CM | POA: Diagnosis not present

## 2017-11-18 DIAGNOSIS — Z923 Personal history of irradiation: Secondary | ICD-10-CM

## 2017-11-18 DIAGNOSIS — Z5111 Encounter for antineoplastic chemotherapy: Secondary | ICD-10-CM

## 2017-11-18 DIAGNOSIS — C109 Malignant neoplasm of oropharynx, unspecified: Secondary | ICD-10-CM

## 2017-11-18 DIAGNOSIS — Z86010 Personal history of colonic polyps: Secondary | ICD-10-CM

## 2017-11-18 DIAGNOSIS — C090 Malignant neoplasm of tonsillar fossa: Secondary | ICD-10-CM | POA: Diagnosis not present

## 2017-11-18 DIAGNOSIS — E1142 Type 2 diabetes mellitus with diabetic polyneuropathy: Secondary | ICD-10-CM

## 2017-11-18 DIAGNOSIS — E039 Hypothyroidism, unspecified: Secondary | ICD-10-CM

## 2017-11-18 DIAGNOSIS — F419 Anxiety disorder, unspecified: Secondary | ICD-10-CM | POA: Diagnosis not present

## 2017-11-18 DIAGNOSIS — G8929 Other chronic pain: Secondary | ICD-10-CM | POA: Diagnosis not present

## 2017-11-18 DIAGNOSIS — E7800 Pure hypercholesterolemia, unspecified: Secondary | ICD-10-CM

## 2017-11-18 DIAGNOSIS — F329 Major depressive disorder, single episode, unspecified: Secondary | ICD-10-CM | POA: Diagnosis not present

## 2017-11-18 DIAGNOSIS — R634 Abnormal weight loss: Secondary | ICD-10-CM | POA: Diagnosis not present

## 2017-11-18 DIAGNOSIS — R21 Rash and other nonspecific skin eruption: Secondary | ICD-10-CM

## 2017-11-18 DIAGNOSIS — G893 Neoplasm related pain (acute) (chronic): Secondary | ICD-10-CM | POA: Diagnosis not present

## 2017-11-18 DIAGNOSIS — I480 Paroxysmal atrial fibrillation: Secondary | ICD-10-CM

## 2017-11-18 DIAGNOSIS — Z87891 Personal history of nicotine dependence: Secondary | ICD-10-CM

## 2017-11-18 LAB — URINALYSIS, COMPLETE (UACMP) WITH MICROSCOPIC
Bacteria, UA: NONE SEEN
Bilirubin Urine: NEGATIVE
Glucose, UA: 150 mg/dL — AB
Hgb urine dipstick: NEGATIVE
Ketones, ur: NEGATIVE mg/dL
Leukocytes, UA: NEGATIVE
Nitrite: NEGATIVE
Protein, ur: 100 mg/dL — AB
Specific Gravity, Urine: 1.02 (ref 1.005–1.030)
pH: 6 (ref 5.0–8.0)

## 2017-11-18 LAB — CBC WITH DIFFERENTIAL/PLATELET
Basophils Absolute: 0 10*3/uL (ref 0–0.1)
Basophils Relative: 1 %
Eosinophils Absolute: 0 10*3/uL (ref 0–0.7)
Eosinophils Relative: 0 %
HCT: 39.7 % — ABNORMAL LOW (ref 40.0–52.0)
Hemoglobin: 13.2 g/dL (ref 13.0–18.0)
Lymphocytes Relative: 13 %
Lymphs Abs: 0.9 10*3/uL — ABNORMAL LOW (ref 1.0–3.6)
MCH: 28.7 pg (ref 26.0–34.0)
MCHC: 33.3 g/dL (ref 32.0–36.0)
MCV: 86.2 fL (ref 80.0–100.0)
Monocytes Absolute: 0.4 10*3/uL (ref 0.2–1.0)
Monocytes Relative: 5 %
Neutro Abs: 5.6 10*3/uL (ref 1.4–6.5)
Neutrophils Relative %: 81 %
Platelets: 148 10*3/uL — ABNORMAL LOW (ref 150–440)
RBC: 4.61 MIL/uL (ref 4.40–5.90)
RDW: 15.8 % — ABNORMAL HIGH (ref 11.5–14.5)
WBC: 6.9 10*3/uL (ref 3.8–10.6)

## 2017-11-18 LAB — COMPREHENSIVE METABOLIC PANEL
ALT: 35 U/L (ref 17–63)
AST: 18 U/L (ref 15–41)
Albumin: 3.3 g/dL — ABNORMAL LOW (ref 3.5–5.0)
Alkaline Phosphatase: 57 U/L (ref 38–126)
Anion gap: 9 (ref 5–15)
BUN: 36 mg/dL — ABNORMAL HIGH (ref 6–20)
CO2: 27 mmol/L (ref 22–32)
Calcium: 8.9 mg/dL (ref 8.9–10.3)
Chloride: 99 mmol/L — ABNORMAL LOW (ref 101–111)
Creatinine, Ser: 0.91 mg/dL (ref 0.61–1.24)
GFR calc Af Amer: >60 mL/min (ref >60)
GFR calc non Af Amer: >60 mL/min (ref >60)
Glucose, Bld: 110 mg/dL — ABNORMAL HIGH (ref 65–99)
Potassium: 3.9 mmol/L (ref 3.5–5.1)
Sodium: 135 mmol/L (ref 135–145)
Total Bilirubin: 1 mg/dL (ref 0.3–1.2)
Total Protein: 6.7 g/dL (ref 6.5–8.1)

## 2017-11-18 LAB — LIPID PANEL
Cholesterol: 219 mg/dL — ABNORMAL HIGH (ref 0–200)
HDL: 41 mg/dL (ref >40)
LDL Cholesterol: 143 mg/dL — ABNORMAL HIGH (ref 0–99)
Total CHOL/HDL Ratio: 5.3 RATIO
Triglycerides: 177 mg/dL — ABNORMAL HIGH (ref <150)
VLDL: 35 mg/dL (ref 0–40)

## 2017-11-18 LAB — TSH: TSH: 0.704 u[IU]/mL (ref 0.350–4.500)

## 2017-11-18 LAB — PSA: Prostatic Specific Antigen: 0.2 ng/mL (ref 0.00–4.00)

## 2017-11-18 MED ORDER — HEPARIN SOD (PORK) LOCK FLUSH 100 UNIT/ML IV SOLN
500.0000 [IU] | INTRAVENOUS | Status: AC | PRN
  Administered 2017-11-18: 500 [IU]

## 2017-11-18 MED ORDER — SODIUM CHLORIDE 0.9% FLUSH
10.0000 mL | INTRAVENOUS | Status: AC | PRN
  Administered 2017-11-18: 10 mL
  Filled 2017-11-18: qty 10

## 2017-11-18 NOTE — Progress Notes (Signed)
Patient here for follow with labs today and is scheduled for treatment tomorrow. He states that he started a Z-Pack yesterday for a sinus infection. He is starting to feel better today.

## 2017-11-19 ENCOUNTER — Ambulatory Visit: Payer: Federal, State, Local not specified - PPO

## 2017-11-19 ENCOUNTER — Inpatient Hospital Stay: Payer: Federal, State, Local not specified - PPO

## 2017-11-19 ENCOUNTER — Ambulatory Visit
Admission: RE | Admit: 2017-11-19 | Discharge: 2017-11-19 | Disposition: A | Discharge status: Home / Self Care | Payer: Federal, State, Local not specified - PPO | Source: Ambulatory Visit | Attending: Radiation Oncology | Admitting: Radiation Oncology

## 2017-11-19 VITALS — BP 121/79 | HR 80 | Temp 98.7°F | Resp 18

## 2017-11-19 DIAGNOSIS — C109 Malignant neoplasm of oropharynx, unspecified: Secondary | ICD-10-CM | POA: Diagnosis not present

## 2017-11-19 DIAGNOSIS — C090 Malignant neoplasm of tonsillar fossa: Secondary | ICD-10-CM | POA: Diagnosis not present

## 2017-11-19 LAB — HEMOGLOBIN A1C
Hgb A1c MFr Bld: 10.8 % — ABNORMAL HIGH (ref 4.8–5.6)
Mean Plasma Glucose: 263 mg/dL

## 2017-11-19 MED ORDER — SODIUM CHLORIDE 0.9 % IV SOLN
Freq: Once | INTRAVENOUS | Status: AC
  Administered 2017-11-19: 09:00:00 via INTRAVENOUS
  Filled 2017-11-19: qty 1000

## 2017-11-19 MED ORDER — PALONOSETRON HCL INJECTION 0.25 MG/5ML
0.2500 mg | Freq: Once | INTRAVENOUS | Status: AC
  Administered 2017-11-19: 0.25 mg via INTRAVENOUS
  Filled 2017-11-19: qty 5

## 2017-11-19 MED ORDER — CISPLATIN CHEMO INJECTION 100MG/100ML
40.0000 mg/m2 | Freq: Once | INTRAVENOUS | Status: AC
  Administered 2017-11-19: 92 mg via INTRAVENOUS
  Filled 2017-11-19: qty 92

## 2017-11-19 MED ORDER — HEPARIN SOD (PORK) LOCK FLUSH 100 UNIT/ML IV SOLN
500.0000 [IU] | Freq: Once | INTRAVENOUS | Status: AC
  Administered 2017-11-19: 500 [IU] via INTRAVENOUS

## 2017-11-19 MED ORDER — POTASSIUM CHLORIDE 2 MEQ/ML IV SOLN
Freq: Once | INTRAVENOUS | Status: AC
  Administered 2017-11-19: 10:00:00 via INTRAVENOUS
  Filled 2017-11-19: qty 1000

## 2017-11-19 MED ORDER — SODIUM CHLORIDE 0.9 % IV SOLN
Freq: Once | INTRAVENOUS | Status: AC
  Administered 2017-11-19: 12:00:00 via INTRAVENOUS
  Filled 2017-11-19: qty 5

## 2017-11-20 ENCOUNTER — Ambulatory Visit
Admission: RE | Admit: 2017-11-20 | Discharge: 2017-11-20 | Disposition: A | Discharge status: Home / Self Care | Payer: Federal, State, Local not specified - PPO | Source: Ambulatory Visit | Attending: Radiation Oncology | Admitting: Radiation Oncology

## 2017-11-20 DIAGNOSIS — C090 Malignant neoplasm of tonsillar fossa: Secondary | ICD-10-CM | POA: Diagnosis not present

## 2017-11-21 ENCOUNTER — Ambulatory Visit
Admission: RE | Admit: 2017-11-21 | Discharge: 2017-11-21 | Disposition: A | Discharge status: Home / Self Care | Payer: Federal, State, Local not specified - PPO | Source: Ambulatory Visit | Attending: Radiation Oncology | Admitting: Radiation Oncology

## 2017-11-21 DIAGNOSIS — C090 Malignant neoplasm of tonsillar fossa: Secondary | ICD-10-CM | POA: Diagnosis not present

## 2017-11-24 ENCOUNTER — Ambulatory Visit: Payer: Federal, State, Local not specified - PPO

## 2017-11-26 ENCOUNTER — Ambulatory Visit: Payer: Federal, State, Local not specified - PPO

## 2017-11-26 ENCOUNTER — Ambulatory Visit
Admission: RE | Admit: 2017-11-26 | Discharge: 2017-11-26 | Disposition: A | Discharge status: Home / Self Care | Payer: Federal, State, Local not specified - PPO | Source: Ambulatory Visit | Attending: Radiation Oncology | Admitting: Radiation Oncology

## 2017-11-26 DIAGNOSIS — C090 Malignant neoplasm of tonsillar fossa: Secondary | ICD-10-CM | POA: Diagnosis not present

## 2017-11-27 ENCOUNTER — Ambulatory Visit: Payer: Federal, State, Local not specified - PPO

## 2017-11-27 ENCOUNTER — Ambulatory Visit
Admission: RE | Admit: 2017-11-27 | Discharge: 2017-11-27 | Disposition: A | Discharge status: Home / Self Care | Payer: Federal, State, Local not specified - PPO | Source: Ambulatory Visit | Attending: Radiation Oncology | Admitting: Radiation Oncology

## 2017-11-27 ENCOUNTER — Other Ambulatory Visit: Payer: Self-pay | Admitting: *Deleted

## 2017-11-27 DIAGNOSIS — C090 Malignant neoplasm of tonsillar fossa: Secondary | ICD-10-CM | POA: Diagnosis not present

## 2017-11-27 MED ORDER — SILVER SULFADIAZINE 1 % EX CREA: 1.0000 "application " | TOPICAL_CREAM | Freq: Every day | CUTANEOUS | 6 refills | Status: DC

## 2017-11-28 ENCOUNTER — Other Ambulatory Visit: Payer: Self-pay | Admitting: *Deleted

## 2017-11-28 ENCOUNTER — Ambulatory Visit
Admission: RE | Admit: 2017-11-28 | Discharge: 2017-11-28 | Disposition: A | Discharge status: Home / Self Care | Payer: Federal, State, Local not specified - PPO | Source: Ambulatory Visit | Attending: Radiation Oncology | Admitting: Radiation Oncology

## 2017-11-28 DIAGNOSIS — C090 Malignant neoplasm of tonsillar fossa: Secondary | ICD-10-CM | POA: Diagnosis not present

## 2017-11-28 MED ORDER — MAGIC MOUTHWASH: 5.0000 mL | Freq: Four times a day (QID) | ORAL | 0 refills | Status: DC | PRN

## 2017-12-01 ENCOUNTER — Inpatient Hospital Stay (HOSPITAL_BASED_OUTPATIENT_CLINIC_OR_DEPARTMENT_OTHER): Payer: Federal, State, Local not specified - PPO | Admitting: Nurse Practitioner

## 2017-12-01 ENCOUNTER — Other Ambulatory Visit: Payer: Self-pay | Admitting: Nurse Practitioner

## 2017-12-01 ENCOUNTER — Encounter: Payer: Self-pay | Admitting: Nurse Practitioner

## 2017-12-01 ENCOUNTER — Inpatient Hospital Stay: Payer: Federal, State, Local not specified - PPO

## 2017-12-01 ENCOUNTER — Other Ambulatory Visit: Payer: Self-pay

## 2017-12-01 VITALS — BP 134/88 | HR 103 | Temp 97.3°F | Resp 20 | Ht 69.5 in | Wt 199.4 lb

## 2017-12-01 VITALS — BP 120/80 | HR 80

## 2017-12-01 DIAGNOSIS — C770 Secondary and unspecified malignant neoplasm of lymph nodes of head, face and neck: Secondary | ICD-10-CM | POA: Diagnosis not present

## 2017-12-01 DIAGNOSIS — Z86010 Personal history of colonic polyps: Secondary | ICD-10-CM

## 2017-12-01 DIAGNOSIS — D696 Thrombocytopenia, unspecified: Secondary | ICD-10-CM | POA: Diagnosis not present

## 2017-12-01 DIAGNOSIS — R634 Abnormal weight loss: Secondary | ICD-10-CM | POA: Diagnosis not present

## 2017-12-01 DIAGNOSIS — R5383 Other fatigue: Secondary | ICD-10-CM | POA: Diagnosis not present

## 2017-12-01 DIAGNOSIS — E860 Dehydration: Secondary | ICD-10-CM

## 2017-12-01 DIAGNOSIS — G893 Neoplasm related pain (acute) (chronic): Secondary | ICD-10-CM

## 2017-12-01 DIAGNOSIS — R197 Diarrhea, unspecified: Secondary | ICD-10-CM | POA: Diagnosis not present

## 2017-12-01 DIAGNOSIS — G8929 Other chronic pain: Secondary | ICD-10-CM

## 2017-12-01 DIAGNOSIS — R112 Nausea with vomiting, unspecified: Secondary | ICD-10-CM | POA: Diagnosis not present

## 2017-12-01 DIAGNOSIS — F329 Major depressive disorder, single episode, unspecified: Secondary | ICD-10-CM

## 2017-12-01 DIAGNOSIS — R21 Rash and other nonspecific skin eruption: Secondary | ICD-10-CM

## 2017-12-01 DIAGNOSIS — R531 Weakness: Secondary | ICD-10-CM | POA: Diagnosis not present

## 2017-12-01 DIAGNOSIS — I480 Paroxysmal atrial fibrillation: Secondary | ICD-10-CM

## 2017-12-01 DIAGNOSIS — E039 Hypothyroidism, unspecified: Secondary | ICD-10-CM

## 2017-12-01 DIAGNOSIS — C109 Malignant neoplasm of oropharynx, unspecified: Secondary | ICD-10-CM | POA: Diagnosis not present

## 2017-12-01 DIAGNOSIS — Z923 Personal history of irradiation: Secondary | ICD-10-CM

## 2017-12-01 DIAGNOSIS — H6691 Otitis media, unspecified, right ear: Secondary | ICD-10-CM | POA: Diagnosis not present

## 2017-12-01 DIAGNOSIS — E871 Hypo-osmolality and hyponatremia: Secondary | ICD-10-CM | POA: Diagnosis not present

## 2017-12-01 DIAGNOSIS — F419 Anxiety disorder, unspecified: Secondary | ICD-10-CM

## 2017-12-01 DIAGNOSIS — I4891 Unspecified atrial fibrillation: Secondary | ICD-10-CM

## 2017-12-01 DIAGNOSIS — I10 Essential (primary) hypertension: Secondary | ICD-10-CM

## 2017-12-01 DIAGNOSIS — R630 Anorexia: Secondary | ICD-10-CM | POA: Diagnosis not present

## 2017-12-01 DIAGNOSIS — E119 Type 2 diabetes mellitus without complications: Secondary | ICD-10-CM

## 2017-12-01 DIAGNOSIS — Z87891 Personal history of nicotine dependence: Secondary | ICD-10-CM

## 2017-12-01 DIAGNOSIS — K1233 Oral mucositis (ulcerative) due to radiation: Secondary | ICD-10-CM

## 2017-12-01 DIAGNOSIS — H66001 Acute suppurative otitis media without spontaneous rupture of ear drum, right ear: Secondary | ICD-10-CM

## 2017-12-01 DIAGNOSIS — K219 Gastro-esophageal reflux disease without esophagitis: Secondary | ICD-10-CM

## 2017-12-01 DIAGNOSIS — Y842 Radiological procedure and radiotherapy as the cause of abnormal reaction of the patient, or of later complication, without mention of misadventure at the time of the procedure: Secondary | ICD-10-CM

## 2017-12-01 LAB — COMPREHENSIVE METABOLIC PANEL
ALT: 30 U/L (ref 17–63)
AST: 16 U/L (ref 15–41)
Albumin: 3.6 g/dL (ref 3.5–5.0)
Alkaline Phosphatase: 56 U/L (ref 38–126)
Anion gap: 11 (ref 5–15)
BUN: 16 mg/dL (ref 6–20)
CO2: 25 mmol/L (ref 22–32)
Calcium: 8.8 mg/dL — ABNORMAL LOW (ref 8.9–10.3)
Chloride: 97 mmol/L — ABNORMAL LOW (ref 101–111)
Creatinine, Ser: 0.84 mg/dL (ref 0.61–1.24)
GFR calc Af Amer: >60 mL/min (ref >60)
GFR calc non Af Amer: >60 mL/min (ref >60)
Glucose, Bld: 152 mg/dL — ABNORMAL HIGH (ref 65–99)
Potassium: 3.9 mmol/L (ref 3.5–5.1)
Sodium: 133 mmol/L — ABNORMAL LOW (ref 135–145)
Total Bilirubin: 1.4 mg/dL — ABNORMAL HIGH (ref 0.3–1.2)
Total Protein: 7 g/dL (ref 6.5–8.1)

## 2017-12-01 LAB — CBC WITH DIFFERENTIAL/PLATELET
Basophils Absolute: 0 10*3/uL (ref 0–0.1)
Basophils Relative: 1 %
Eosinophils Absolute: 0 10*3/uL (ref 0–0.7)
Eosinophils Relative: 0 %
HCT: 36.3 % — ABNORMAL LOW (ref 40.0–52.0)
Hemoglobin: 12.2 g/dL — ABNORMAL LOW (ref 13.0–18.0)
Lymphocytes Relative: 12 %
Lymphs Abs: 0.6 10*3/uL — ABNORMAL LOW (ref 1.0–3.6)
MCH: 29.5 pg (ref 26.0–34.0)
MCHC: 33.5 g/dL (ref 32.0–36.0)
MCV: 88 fL (ref 80.0–100.0)
Monocytes Absolute: 0.7 10*3/uL (ref 0.2–1.0)
Monocytes Relative: 14 %
Neutro Abs: 3.6 10*3/uL (ref 1.4–6.5)
Neutrophils Relative %: 73 %
Platelets: 131 10*3/uL — ABNORMAL LOW (ref 150–440)
RBC: 4.13 MIL/uL — ABNORMAL LOW (ref 4.40–5.90)
RDW: 18.5 % — ABNORMAL HIGH (ref 11.5–14.5)
WBC: 4.9 10*3/uL (ref 3.8–10.6)

## 2017-12-01 MED ORDER — SODIUM CHLORIDE 0.9% FLUSH
10.0000 mL | Freq: Once | INTRAVENOUS | Status: AC
  Administered 2017-12-01: 10 mL via INTRAVENOUS
  Filled 2017-12-01: qty 10

## 2017-12-01 MED ORDER — HEPARIN SOD (PORK) LOCK FLUSH 100 UNIT/ML IV SOLN
500.0000 [IU] | Freq: Once | INTRAVENOUS | Status: AC
  Administered 2017-12-01: 500 [IU]
  Filled 2017-12-01: qty 5

## 2017-12-01 MED ORDER — SODIUM CHLORIDE 0.9 % IV SOLN
INTRAVENOUS | Status: DC
  Administered 2017-12-01: 12:00:00 via INTRAVENOUS
  Filled 2017-12-01 (×2): qty 1000

## 2017-12-01 MED ORDER — PROCHLORPERAZINE MALEATE 10 MG PO TABS
10.0000 mg | ORAL_TABLET | Freq: Four times a day (QID) | ORAL | Status: DC | PRN
  Administered 2017-12-01: 10 mg via ORAL

## 2017-12-01 MED ORDER — SULFAMETHOXAZOLE-TRIMETHOPRIM 800-160 MG PO TABS: 1.0000 | ORAL_TABLET | Freq: Two times a day (BID) | ORAL | 0 refills | Status: AC

## 2017-12-01 MED ORDER — SODIUM CHLORIDE 0.9 % IV SOLN
Freq: Once | INTRAVENOUS | Status: AC
  Administered 2017-12-01: 10:00:00 via INTRAVENOUS
  Filled 2017-12-01: qty 1000

## 2017-12-01 MED ORDER — ONDANSETRON HCL 4 MG/2ML IJ SOLN
8.0000 mg | Freq: Once | INTRAMUSCULAR | Status: AC
  Administered 2017-12-01: 8 mg via INTRAVENOUS
  Filled 2017-12-01: qty 4

## 2017-12-01 NOTE — Progress Notes (Signed)
Pt reports persistant nausea w/o vomiting s/p IV zofran 8mg  IV once. Per v/o Lauren, NP- new orders given to hang 1000 ml -- 2nd bag of IV Fluids-NS 0.9%. Orthostatic vitals taken. Orders obtained for oral compazine 10 mg tablet once.

## 2017-12-01 NOTE — Progress Notes (Signed)
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 Patient was found to have a right neck mass when he underwent morphine pump placement. At that time he was seen by ENT and also went through antibiotic course but no malignancy was found.  Subsequent, patient underwent hemicolectomy by Dr. Jamal Collin after a non-resectable colonic polyp was discovered during colonoscopy.  Shortly after surgery, the neck mass reappeared.  PCP gave empiric antibiotics.  Mass did not improved.    2. Referred to Dr. Tami Ribas.  Tonsillar asymmetry on NPL exam.  CT of neckshowed multiple right-sided lymph nodes at level IIA and IIB (1.5-1.8 cm) suspicious in appearance with asymmetric soft tissue fullness of oropharynx measuring up to 2.7cm.   3. 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  PET CT scan showed right palatine tonsillar mass with a maximum SUV of 8.5 compatible with malignancy. Several right level IIA lymph nodes are present and were hypermetabolic, larger node measuring 1.6 cm in short axis. No evidence of metastatic spread to the chest abdomen and pelvis or visualized skeleton  5. Patient has been referred to [facility] for further management. Morphine pump placed for chronic pain control.  Interval history-patient presents to  symptom management clinic for complaints of diarrhea. Symptoms began approximately 1 day ago and symptoms occur 6-7 times per day.  He describes diarrhea as watery and loose.  Additionally complains of nausea, decreased appetite, weakness, and near syncope.  Patient denies falls, blood in stools or emesis.  Denies fever or chills.  Has had similar episodes in the past and was treated with IV fluids and p.o. medications.  Symptoms often occur after chemotherapy.  Pain has improved-controlled with pain pump.    ECOG FS:1 - Symptomatic but completely ambulatory  Review of systems- Review of Systems  Constitutional: Positive for malaise/fatigue and weight loss. Negative for chills and fever.  HENT: Positive for sinus pain and sore throat. Negative for congestion, ear discharge, ear pain and nosebleeds.   Eyes: Negative for pain and redness.  Respiratory: Negative for cough, hemoptysis, sputum production, shortness of breath and wheezing.   Cardiovascular: Negative for chest pain, palpitations and leg swelling.  Gastrointestinal: Positive for abdominal pain, diarrhea, nausea and vomiting. Negative for blood in stool, constipation, heartburn and melena.  Genitourinary: Negative for dysuria, flank pain, frequency, hematuria and urgency.  Musculoskeletal: Positive for back pain and neck pain. Negative for falls and joint pain.  Skin: Positive for rash. Negative for itching.  Neurological: Positive for dizziness and weakness. Negative for tingling, tremors, loss of consciousness and headaches.  Endo/Heme/Allergies: Negative for environmental allergies. Does not bruise/bleed easily.  Psychiatric/Behavioral: Negative for depression. The patient is nervous/anxious.      Current treatment- s/p cycle 7 of 7- weekly cisplatin (11/19/17) w/ concurrent RT. Completed XRT 11/28/17.  Allergies  Allergen Reactions  . Biaxin [Clarithromycin] Other (See Comments)    Severe hiccups  . Ceftin [Cefuroxime  Axetil]  Other (See Comments)    Severe hiccups  . Penicillins Hives    Has patient had a PCN reaction causing immediate rash, facial/tongue/throat swelling, SOB or lightheadedness with hypotension: No Has patient had a PCN reaction causing severe rash involving mucus membranes or skin necrosis: unknown Has patient had a PCN reaction that required hospitalization: No Has patient had a PCN reaction occurring within the last 10 years: No If all of the above answers are "NO", then may proceed with Cephalosporin use.      Past Medical History:  Diagnosis Date  . Anxiety   . Arthritis   . Cancer ([place]) 08/21/2017  . Chronic pain 1996   due to work injury  . Colon polyp    adenomatous polyp ascending colon  . Cough    THAT WONT GO AWAY-PT TO SEE DR Doy Hutching ON 06-10-17 @ 4 PM  . Depression   . Diabetes mellitus without complication ([place])   . Dysrhythmia    AFIB X 1  . GERD (gastroesophageal reflux disease)   . Herniated lumbar intervertebral disc   . Hypertension   . Hypothyroidism   . Neck pain   . Neuromuscular disorder ([place])   . Presence of intrathecal pump    Dr. Mechele Dawley in [place], [place]     Past Surgical History:  Procedure Laterality Date  . COLONOSCOPY  2013   [place]  . COLONOSCOPY WITH PROPOFOL N/A 04/22/2017   Procedure: COLONOSCOPY WITH PROPOFOL;  Surgeon: Lollie Sails, MD;  Location: [facility] ENDOSCOPY;  Service: Endoscopy;  Laterality: N/A;  . IMAGE GUIDED SINUS SURGERY    . implanted morphine pump  2016  . IR FLUORO GUIDE PORT INSERTION LEFT  09/26/2017  . LAPAROSCOPIC CHOLECYSTECTOMY  2013  . PARTIAL COLECTOMY Right 06/17/2017   Procedure: PARTIAL COLECTOMY;  Surgeon: Christene Lye, MD;  Location: [facility] ORS;  Service: General;  Laterality: Right;  . UMBILICAL HERNIA REPAIR  2013  . UMBILICAL HERNIA REPAIR  06/17/2017   Procedure: HERNIA REPAIR UMBILICAL ADULT;  Surgeon: Christene Lye, MD;  Location: [facility] ORS;  Service: General;;  .  VASECTOMY      Social History   Socioeconomic History  . Marital status: Married    Spouse name: Not on file  . Number of children: Not on file  . Years of education: Not on file  . Highest education level: Not on file  Social Needs  . Financial resource strain: Not on file  . Food insecurity - worry: Not on file  . Food insecurity - inability: Not on file  . Transportation needs - medical: Not on file  . Transportation needs - non-medical: Not on file  Occupational History  . Not on file  Tobacco Use  . Smoking status: Former Smoker    Years: 20.00    Types: Cigarettes    Last attempt to quit: 12/03/1999    Years since quitting: 18.0  . Smokeless tobacco: Never Used  Substance and Sexual Activity  . Alcohol use: No  . Drug use: No  . Sexual activity: Yes  Other Topics Concern  . Not on file  Social History Narrative  . Not on file    Family History  Problem Relation Age of Onset  . Breast cancer Cousin 30  . Cancer - Colon Neg Hx   . Prostate cancer Neg Hx      Current Outpatient Medications:  .  amitriptyline (ELAVIL) 100 MG tablet, Take 50 mg by mouth at  bedtime as needed for sleep. , Disp: , Rfl:  .  apixaban (ELIQUIS) 5 MG TABS tablet, Take 1 tablet 2 (two) times daily by mouth., Disp: , Rfl:  .  Baclofen 5 MG TABS, TAKE 1 TABLET BY MOUTH THREE TIMES DAILY FOR HICCUPS, Disp: 270 tablet, Rfl: 0 .  Cholecalciferol (VITAMIN D3) 5000 units TABS, Take 5,000 Units by mouth daily. , Disp: , Rfl:  .  citalopram (CELEXA) 40 MG tablet, Take 40 mg by mouth every morning. , Disp: , Rfl:  .  dexamethasone (DECADRON) 4 MG tablet, Take 1 tablet (4 mg total) by mouth daily., Disp: 30 tablet, Rfl: 0 .  diltiazem (CARDIZEM CD) 240 MG 24 hr capsule, Take 1 capsule daily by mouth., Disp: , Rfl:  .  diphenoxylate-atropine (LOMOTIL) 2.5-0.025 MG tablet, Take 1 tablet by mouth 4 (four) times daily as needed for diarrhea or loose stools., Disp: 60 tablet, Rfl: 0 .  esomeprazole  (NEXIUM) 40 MG capsule, Take 40 mg by mouth daily as needed. , Disp: , Rfl:  .  glyBURIDE-metformin (GLUCOVANCE) 5-500 MG tablet, Take 0.5 tablets by mouth See admin instructions. Takes 0.5 tablet twice daily in morning and bedtime and takes a 3rd dose only if needed for elevated blood sugar, Disp: , Rfl:  .  HYDROcodone-acetaminophen (NORCO) 7.5-325 MG tablet, Take 1 tablet by mouth every 6 (six) hours as needed for moderate pain. , Disp: , Rfl:  .  Insulin Glargine (BASAGLAR KWIKPEN) 100 UNIT/ML SOPN, Inject 26 Units 2 (two) times daily into the skin. , Disp: , Rfl:  .  KRILL OIL PO, Take 1 capsule by mouth daily. , Disp: , Rfl:  .  lidocaine-prilocaine (EMLA) cream, Apply to affected area once, Disp: 30 g, Rfl: 3 .  LORazepam (ATIVAN) 0.5 MG tablet, Take 1 tablet (0.5 mg total) by mouth every 6 (six) hours as needed (Nausea or vomiting)., Disp: 30 tablet, Rfl: 0 .  magic mouthwash SOLN, Take 5 mLs by mouth 4 (four) times daily as needed for mouth pain., Disp: 420 mL, Rfl: 0 .  MORPHINE SULFATE IJ, Inject as directed. Morphine 4mg /ml epidural pump. 1.4897 mg/day, Disp: , Rfl:  .  niacin 500 MG tablet, Take 500 mg by mouth at bedtime., Disp: , Rfl:  .  ondansetron (ZOFRAN) 8 MG tablet, Take 1 tablet (8 mg total) by mouth 2 (two) times daily as needed. Start on the third day after chemotherapy., Disp: 30 tablet, Rfl: 1 .  oxymetazoline (AFRIN) 0.05 % nasal spray, Place 1 spray into both nostrils 2 (two) times daily as needed for congestion., Disp: , Rfl:  .  pregabalin (LYRICA) 150 MG capsule, Take 150 mg by mouth 2 (two) times daily., Disp: , Rfl:  .  prochlorperazine (COMPAZINE) 10 MG tablet, Take 1 tablet (10 mg total) by mouth every 6 (six) hours as needed (Nausea or vomiting)., Disp: 30 tablet, Rfl: 1 .  silver sulfADIAZINE (SILVADENE) 1 % cream, Apply 1 application topically 2 (two) times daily., Disp: 50 g, Rfl: 0 .  sucralfate (CARAFATE) 1 g tablet, Take 1 tablet (1 g total) 3 (three) times  daily by mouth. Dissolve in 2-3 tbsp warm water, swish and swallow., Disp: 90 tablet, Rfl: 3 .  thyroid (ARMOUR) 32.5 MG tablet, Take 32.5 mg by mouth every morning. , Disp: , Rfl:  .  tiZANidine (ZANAFLEX) 4 MG tablet, Take 4 mg by mouth every 8 (eight) hours as needed for muscle spasms., Disp: , Rfl:  .  vitamin E (  VITAMIN E) 400 UNIT capsule, Take 400 Units by mouth daily., Disp: , Rfl:  .  zinc gluconate 50 MG tablet, Take 50 mg by mouth daily., Disp: , Rfl:  .  dexamethasone (DECADRON) 4 MG tablet, Take 2 tablets by mouth once a day on the day after chemotherapy and then take 2 tablets two times a day for 2 days. Take with food., Disp: 30 tablet, Rfl: 1 .  sulfamethoxazole-trimethoprim (BACTRIM DS,SEPTRA DS) 800-160 MG tablet, Take 1 tablet by mouth 2 (two) times daily for 10 days., Disp: 20 tablet, Rfl: 0 No current facility-administered medications for this visit.   Facility-Administered Medications Ordered in Other Visits:  .  0.9 %  sodium chloride infusion, , Intravenous, Continuous, Burns, Wandra Feinstein, NP, Stopped at 11/11/17 1336 .  heparin lock flush 100 unit/mL, 500 Units, Intravenous, Once, Sindy Guadeloupe, MD  Physical exam:  Vitals:   12/01/17 1012  BP: 134/88  Pulse: (!) 103  Resp: 20  Temp: (!) 97.3 F (36.3 C)  TempSrc: Tympanic  Weight: 199 lb 6.4 oz (90.4 kg)  Height: 5' 9.5" (1.765 m)   Physical Exam  Constitutional: He is oriented to person, place, and time and well-developed, well-nourished, and in no distress. No distress.  HENT:  Head: Normocephalic and atraumatic.  Right Ear: Hearing normal. There is tenderness. There is mastoid tenderness. Tympanic membrane is not erythematous and not bulging. A middle ear effusion is present.  Left Ear: Hearing, tympanic membrane, external ear and ear canal normal.  Mouth/Throat: Oropharynx is clear and moist.  Lips dry.  Eyes: Pupils are equal, round, and reactive to light.  Neck: Normal range of motion.  redness    Cardiovascular: Normal rate, regular rhythm and normal heart sounds.  Pulmonary/Chest: Effort normal and breath sounds normal. No respiratory distress. He has no wheezes. He has no rales.  Abdominal: Soft. Bowel sounds are normal. He exhibits no distension. There is tenderness. There is no rebound.  Musculoskeletal: Normal range of motion. He exhibits no deformity.  Neurological: He is alert and oriented to person, place, and time.  Skin: Skin is warm and dry.  Psychiatric: Affect normal.      CMP Latest Ref Rng & Units 12/01/2017  Glucose 65 - 99 mg/dL 152(H)  BUN 6 - 20 mg/dL 16  Creatinine 0.61 - 1.24 mg/dL 0.84  Sodium 135 - 145 mmol/L 133(L)  Potassium 3.5 - 5.1 mmol/L 3.9  Chloride 101 - 111 mmol/L 97(L)  CO2 22 - 32 mmol/L 25  Calcium 8.9 - 10.3 mg/dL 8.8(L)  Total Protein 6.5 - 8.1 g/dL 7.0  Total Bilirubin 0.3 - 1.2 mg/dL 1.4(H)  Alkaline Phos 38 - 126 U/L 56  AST 15 - 41 U/L 16  ALT 17 - 63 U/L 30   CBC Latest Ref Rng & Units 12/01/2017  WBC 3.8 - 10.6 K/uL 4.9  Hemoglobin 13.0 - 18.0 g/dL 12.2(L)  Hematocrit 40.0 - 52.0 % 36.3(L)  Platelets 150 - 440 K/uL 131(L)      Assessment and plan- Patient is a 61 y.o. male with history of SCC of oropharynx stage I T2N1M0 who presents to Symptom Management Clinic today for complaints of nausea, diarrhea, and decreased appetitie with weakness and near syncope.   1.  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  Completed concurrent radiation (11/28/17) plan for repeat PET/CT on 01/27/18 which is 8 weeks after completion of radiation.  Case to be discussed at tumor board.  Clinically no  evidence of recurrence.  2.  Right ear infection-symptoms consistent with right ear infection.  No rupture of TM. Will give Bactrim DS for 5 days for ear infection.  3.  Nausea and vomiting-nausea and vomiting may be related to sinus drainage?  Has not responded to home medications. Appears dehydrated on exam. Na  133.  Will give 1 L normal saline Zofran and Compazine.   F/u with Dr. Janese Banks as regularly scheduled in 1 week for re-evaluation   Visit Diagnosis 1. Oropharyngeal cancer ([place])   2. Acute suppurative otitis media of right ear without spontaneous rupture of tympanic membrane, recurrence not specified     Patient expressed understanding and was in agreement with this plan. He also understands that He can call clinic at any time with any questions, concerns, or complaints. Patient advised to notify the clinic if there is no improvement in symptoms or if symptoms worsen in next 3-4 days.   A total of (30) minutes of face-to-face time was spent with this patient with greater than 50% of that time in counseling and care-coordination.   Beckey Rutter, DNP, AGNP-C [place] [facility] [phone] (office) 12/08/17 9:46 AM

## 2017-12-08 ENCOUNTER — Encounter: Payer: Self-pay | Admitting: Nurse Practitioner

## 2017-12-09 ENCOUNTER — Inpatient Hospital Stay: Payer: Federal, State, Local not specified - PPO | Attending: Oncology | Admitting: Oncology

## 2017-12-09 ENCOUNTER — Inpatient Hospital Stay: Payer: Federal, State, Local not specified - PPO

## 2017-12-09 ENCOUNTER — Other Ambulatory Visit: Payer: Self-pay | Admitting: *Deleted

## 2017-12-09 ENCOUNTER — Encounter: Payer: Self-pay | Admitting: Oncology

## 2017-12-09 ENCOUNTER — Other Ambulatory Visit: Payer: Self-pay | Admitting: Oncology

## 2017-12-09 VITALS — BP 111/70 | HR 130 | Temp 97.3°F | Resp 20 | Wt 197.0 lb

## 2017-12-09 DIAGNOSIS — R531 Weakness: Secondary | ICD-10-CM | POA: Diagnosis not present

## 2017-12-09 DIAGNOSIS — C109 Malignant neoplasm of oropharynx, unspecified: Secondary | ICD-10-CM | POA: Diagnosis not present

## 2017-12-09 DIAGNOSIS — M5126 Other intervertebral disc displacement, lumbar region: Secondary | ICD-10-CM

## 2017-12-09 DIAGNOSIS — Z86010 Personal history of colonic polyps: Secondary | ICD-10-CM | POA: Diagnosis not present

## 2017-12-09 DIAGNOSIS — G8929 Other chronic pain: Secondary | ICD-10-CM | POA: Insufficient documentation

## 2017-12-09 DIAGNOSIS — R112 Nausea with vomiting, unspecified: Secondary | ICD-10-CM | POA: Diagnosis not present

## 2017-12-09 DIAGNOSIS — D649 Anemia, unspecified: Secondary | ICD-10-CM | POA: Diagnosis not present

## 2017-12-09 DIAGNOSIS — E119 Type 2 diabetes mellitus without complications: Secondary | ICD-10-CM | POA: Insufficient documentation

## 2017-12-09 DIAGNOSIS — R948 Abnormal results of function studies of other organs and systems: Secondary | ICD-10-CM | POA: Diagnosis not present

## 2017-12-09 DIAGNOSIS — M542 Cervicalgia: Secondary | ICD-10-CM | POA: Diagnosis not present

## 2017-12-09 DIAGNOSIS — E039 Hypothyroidism, unspecified: Secondary | ICD-10-CM | POA: Insufficient documentation

## 2017-12-09 DIAGNOSIS — R944 Abnormal results of kidney function studies: Secondary | ICD-10-CM

## 2017-12-09 DIAGNOSIS — E860 Dehydration: Secondary | ICD-10-CM | POA: Insufficient documentation

## 2017-12-09 DIAGNOSIS — I10 Essential (primary) hypertension: Secondary | ICD-10-CM | POA: Insufficient documentation

## 2017-12-09 DIAGNOSIS — K760 Fatty (change of) liver, not elsewhere classified: Secondary | ICD-10-CM | POA: Insufficient documentation

## 2017-12-09 DIAGNOSIS — G709 Myoneural disorder, unspecified: Secondary | ICD-10-CM | POA: Diagnosis not present

## 2017-12-09 DIAGNOSIS — K219 Gastro-esophageal reflux disease without esophagitis: Secondary | ICD-10-CM

## 2017-12-09 DIAGNOSIS — I4891 Unspecified atrial fibrillation: Secondary | ICD-10-CM

## 2017-12-09 DIAGNOSIS — Z87891 Personal history of nicotine dependence: Secondary | ICD-10-CM | POA: Diagnosis not present

## 2017-12-09 DIAGNOSIS — Z923 Personal history of irradiation: Secondary | ICD-10-CM | POA: Insufficient documentation

## 2017-12-09 DIAGNOSIS — F329 Major depressive disorder, single episode, unspecified: Secondary | ICD-10-CM | POA: Insufficient documentation

## 2017-12-09 DIAGNOSIS — Z95828 Presence of other vascular implants and grafts: Secondary | ICD-10-CM

## 2017-12-09 DIAGNOSIS — C770 Secondary and unspecified malignant neoplasm of lymph nodes of head, face and neck: Secondary | ICD-10-CM | POA: Diagnosis not present

## 2017-12-09 DIAGNOSIS — R5383 Other fatigue: Secondary | ICD-10-CM | POA: Insufficient documentation

## 2017-12-09 DIAGNOSIS — N179 Acute kidney failure, unspecified: Secondary | ICD-10-CM | POA: Diagnosis not present

## 2017-12-09 DIAGNOSIS — R7989 Other specified abnormal findings of blood chemistry: Secondary | ICD-10-CM

## 2017-12-09 DIAGNOSIS — F419 Anxiety disorder, unspecified: Secondary | ICD-10-CM | POA: Diagnosis not present

## 2017-12-09 DIAGNOSIS — Z79899 Other long term (current) drug therapy: Secondary | ICD-10-CM | POA: Insufficient documentation

## 2017-12-09 DIAGNOSIS — R945 Abnormal results of liver function studies: Secondary | ICD-10-CM

## 2017-12-09 LAB — COMPREHENSIVE METABOLIC PANEL
ALT: 334 U/L — ABNORMAL HIGH (ref 17–63)
AST: 329 U/L — ABNORMAL HIGH (ref 15–41)
Albumin: 4.2 g/dL (ref 3.5–5.0)
Alkaline Phosphatase: 90 U/L (ref 38–126)
Anion gap: 15 (ref 5–15)
BUN: 38 mg/dL — ABNORMAL HIGH (ref 6–20)
CO2: 21 mmol/L — ABNORMAL LOW (ref 22–32)
Calcium: 9.7 mg/dL (ref 8.9–10.3)
Chloride: 95 mmol/L — ABNORMAL LOW (ref 101–111)
Creatinine, Ser: 1.63 mg/dL — ABNORMAL HIGH (ref 0.61–1.24)
GFR calc Af Amer: 51 mL/min — ABNORMAL LOW (ref >60)
GFR calc non Af Amer: 44 mL/min — ABNORMAL LOW (ref >60)
Glucose, Bld: 250 mg/dL — ABNORMAL HIGH (ref 65–99)
Potassium: 4.2 mmol/L (ref 3.5–5.1)
Sodium: 131 mmol/L — ABNORMAL LOW (ref 135–145)
Total Bilirubin: 1.6 mg/dL — ABNORMAL HIGH (ref 0.3–1.2)
Total Protein: 7.5 g/dL (ref 6.5–8.1)

## 2017-12-09 LAB — CBC WITH DIFFERENTIAL/PLATELET
Basophils Absolute: 0 10*3/uL (ref 0–0.1)
Basophils Relative: 1 %
Eosinophils Absolute: 0 10*3/uL (ref 0–0.7)
Eosinophils Relative: 0 %
HCT: 40 % (ref 40.0–52.0)
Hemoglobin: 13.5 g/dL (ref 13.0–18.0)
Lymphocytes Relative: 8 %
Lymphs Abs: 0.4 10*3/uL — ABNORMAL LOW (ref 1.0–3.6)
MCH: 30.3 pg (ref 26.0–34.0)
MCHC: 33.6 g/dL (ref 32.0–36.0)
MCV: 90.1 fL (ref 80.0–100.0)
Monocytes Absolute: 0.4 10*3/uL (ref 0.2–1.0)
Monocytes Relative: 7 %
Neutro Abs: 4.5 10*3/uL (ref 1.4–6.5)
Neutrophils Relative %: 84 %
Platelets: 219 10*3/uL (ref 150–440)
RBC: 4.44 MIL/uL (ref 4.40–5.90)
RDW: 22.1 % — ABNORMAL HIGH (ref 11.5–14.5)
WBC: 5.3 10*3/uL (ref 3.8–10.6)

## 2017-12-09 MED ORDER — DEXAMETHASONE SODIUM PHOSPHATE 10 MG/ML IJ SOLN
10.0000 mg | Freq: Once | INTRAMUSCULAR | Status: AC
  Administered 2017-12-09: 10 mg via INTRAVENOUS

## 2017-12-09 MED ORDER — PROCHLORPERAZINE MALEATE 10 MG PO TABS: 10.0000 mg | ORAL_TABLET | Freq: Four times a day (QID) | ORAL | 1 refills | Status: DC | PRN

## 2017-12-09 MED ORDER — ONDANSETRON HCL 4 MG/2ML IJ SOLN
4.0000 mg | Freq: Once | INTRAMUSCULAR | Status: AC
  Administered 2017-12-09: 4 mg via INTRAVENOUS
  Filled 2017-12-09: qty 2

## 2017-12-09 MED ORDER — SODIUM CHLORIDE 0.9 % IV SOLN: Freq: Once | INTRAVENOUS | Status: DC

## 2017-12-09 MED ORDER — SODIUM CHLORIDE 0.9 % IV SOLN
Freq: Once | INTRAVENOUS | Status: AC
  Administered 2017-12-09: 15:00:00 via INTRAVENOUS
  Filled 2017-12-09: qty 1000

## 2017-12-09 MED ORDER — SODIUM CHLORIDE 0.9% FLUSH
10.0000 mL | INTRAVENOUS | Status: DC | PRN
  Administered 2017-12-09: 10 mL via INTRAVENOUS
  Filled 2017-12-09: qty 10

## 2017-12-09 MED ORDER — HEPARIN SOD (PORK) LOCK FLUSH 100 UNIT/ML IV SOLN
500.0000 [IU] | Freq: Once | INTRAVENOUS | Status: AC
  Administered 2017-12-09: 500 [IU] via INTRAVENOUS

## 2017-12-09 MED ORDER — DEXAMETHASONE SODIUM PHOSPHATE 10 MG/ML IJ SOLN
INTRAMUSCULAR | Status: AC
  Filled 2017-12-09: qty 1

## 2017-12-09 MED ORDER — ONDANSETRON HCL 4 MG PO TABS: 4.0000 mg | ORAL_TABLET | Freq: Once | ORAL | Status: DC

## 2017-12-09 NOTE — Progress Notes (Signed)
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 Patient is a 62 year old male who was found to have a right neck mass about 1 year ago when he underwent morphine pump placement. At that time he was seen by ENT and also went through antibiotic course but was not found to have a malignancy back then. Patient recently underwent a hemicolectomy by Dr. Jamal Collin as he was found to have a colonic polyp which was not resectable on colonoscopy. Shortly following the surgery patient again noticed right sided neck mass. He wasttreated with empiric antibiotics by Dr. Doy Hutching but the mass did not go away and he was referred to Dr. Tami Ribas. Patient was found to have tonsillar asymmetry on NPL exam and underwent CT neck  2. CT soft tissue of the neck. Multiple right-sided lymph nodes at level IIA and 2B between 1.5-1.8 cm in size which were suspicious in appearance.also noted to have started asymmetric soft tissue fullness in the oropharynx or on the right lateral wall in the right palatine tonsil region measuring up to 2.7 cm.  3. 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 This was followed by a PET CT scan which showed right palatine tonsillar mass with a maximum SUV of 8.5 compatible with malignancy. Several right level IIA lymph nodes are present  and are hypermetabolic, larger node measuring 1.6 cm in short axis. No evidence of metastatic spread to the chest abdomen and pelvis or visualized skeleton  5. Patient has been referred to [place] for further management. Currently patient reports some pain and discomfort in his right neck. Denies any difficulty swallowing. His appetite is good and he does not report any unintentional weight loss. He does have chronic pain for which he has a morphine pump in place  6. Cycle 7 of weekly cisplatin completed on 11/18/17   Interval history-patient had a bout of nausea and vomiting about a week ago and was seen at our clinic.  At that time he received IV fluids as well as antibiotics for possible urine infection.  Following that he says he felt better for about 4-5 days.  He has again had nausea and vomiting starting this morning.  He feels fatigued.  Denies any abdominal pain he is currently on a tapering course of steroids for possible mucositis prescribed by radiation oncology  ECOG PS- 1 Pain scale- 0 Opioid associated constipation- no  Review of systems- Review of Systems  Constitutional: Positive for malaise/fatigue. Negative for chills, fever and weight loss.  HENT: Negative for congestion, ear discharge and nosebleeds.   Eyes: Negative for blurred vision.  Respiratory: Negative for cough, hemoptysis, sputum production, shortness of breath and wheezing.   Cardiovascular: Negative for chest pain, palpitations, orthopnea and claudication.  Gastrointestinal: Positive for nausea and vomiting. Negative for abdominal pain, blood in stool, constipation, diarrhea, heartburn and melena.  Genitourinary: Negative for dysuria, flank pain, frequency, hematuria and urgency.  Musculoskeletal: Negative for  back pain, joint pain and myalgias.  Skin: Negative for rash.  Neurological: Negative for dizziness, tingling, focal weakness, seizures, weakness and headaches.  Endo/Heme/Allergies: Does not bruise/bleed  easily.  Psychiatric/Behavioral: Negative for depression and suicidal ideas. The patient does not have insomnia.        Allergies  Allergen Reactions  . Biaxin [Clarithromycin] Other (See Comments)    Severe hiccups  . Ceftin [Cefuroxime Axetil] Other (See Comments)    Severe hiccups  . Penicillins Hives    Has patient had a PCN reaction causing immediate rash, facial/tongue/throat swelling, SOB or lightheadedness with hypotension: No Has patient had a PCN reaction causing severe rash involving mucus membranes or skin necrosis: unknown Has patient had a PCN reaction that required hospitalization: No Has patient had a PCN reaction occurring within the last 10 years: No If all of the above answers are "NO", then may proceed with Cephalosporin use.      Past Medical History:  Diagnosis Date  . Anxiety   . Arthritis   . Cancer ([place]) 08/21/2017  . Chronic pain 1996   due to work injury  . Colon polyp    adenomatous polyp ascending colon  . Cough    THAT WONT GO AWAY-PT TO SEE DR Doy Hutching ON 06-10-17 @ 4 PM  . Depression   . Diabetes mellitus without complication ([place])   . Dysrhythmia    AFIB X 1  . GERD (gastroesophageal reflux disease)   . Herniated lumbar intervertebral disc   . Hypertension   . Hypothyroidism   . Neck pain   . Neuromuscular disorder ([place])   . Presence of intrathecal pump    Dr. Mechele Dawley in [place], [place]     Past Surgical History:  Procedure Laterality Date  . COLONOSCOPY  2013   [place]  . COLONOSCOPY WITH PROPOFOL N/A 04/22/2017   Procedure: COLONOSCOPY WITH PROPOFOL;  Surgeon: Lollie Sails, MD;  Location[facility]COPY;  Service: Endoscopy;  Laterality: N/A;  . IMAGE GUIDED SINUS SURGERY    . implanted morphine pump  2016  . IR FLUORO GUIDE PORT INSERTION LEFT  09/26/2017  . LAPAROSCOPIC CHOLECYSTECTOMY  2013  . PARTIAL COLECTOMY Right 06/17/2017   Procedure: PARTIAL COLECTOMY;  Surgeon: Christene Lye, MD;   Location: [facility] ORS;  Service: General;  Laterality: Right;  . UMBILICAL HERNIA REPAIR  2013  . UMBILICAL HERNIA REPAIR  06/17/2017   Procedure: HERNIA REPAIR UMBILICAL ADULT;  Surgeon: Christene Lye, MD;  Location: [facility] ORS;  Service: General;;  . VASECTOMY      Social History   Socioeconomic History  . Marital status: Married    Spouse name: Not on file  . Number of children: Not on file  . Years of education: Not on file  . Highest education level: Not on file  Social Needs  . Financial resource strain: Not on file  . Food insecurity - worry: Not on file  . Food insecurity - inability: Not on file  . Transportation needs - medical: Not on file  . Transportation needs - non-medical: Not on file  Occupational History  . Not on file  Tobacco Use  . Smoking status: Former Smoker    Years: 20.00    Types: Cigarettes    Last attempt to quit: 12/03/1999    Years since quitting: 18.0  . Smokeless tobacco: Never Used  Substance and Sexual Activity  . Alcohol use: No  . Drug use: No  . Sexual activity: Yes  Other Topics Concern  . Not on file  Social History Narrative  . Not on file    Family History  Problem Relation Age of Onset  . Breast cancer Cousin 15  . Cancer - Colon Neg Hx   . Prostate cancer Neg Hx      Current Outpatient Medications:  .  amitriptyline (ELAVIL) 100 MG tablet, Take 50 mg by mouth at bedtime as needed for sleep. , Disp: , Rfl:  .  apixaban (ELIQUIS) 5 MG TABS tablet, Take 1 tablet 2 (two) times daily by mouth., Disp: , Rfl:  .  Baclofen 5 MG TABS, TAKE 1 TABLET BY MOUTH THREE TIMES DAILY FOR HICCUPS, Disp: 270 tablet, Rfl: 0 .  Cholecalciferol (VITAMIN D3) 5000 units TABS, Take 5,000 Units by mouth daily. , Disp: , Rfl:  .  citalopram (CELEXA) 40 MG tablet, Take 40 mg by mouth every morning. , Disp: , Rfl:  .  dexamethasone (DECADRON) 4 MG tablet, Take 2 tablets by mouth once a day on the day after chemotherapy and then take 2 tablets two  times a day for 2 days. Take with food., Disp: 30 tablet, Rfl: 1 .  diltiazem (CARDIZEM CD) 240 MG 24 hr capsule, Take 1 capsule daily by mouth., Disp: , Rfl:  .  diphenoxylate-atropine (LOMOTIL) 2.5-0.025 MG tablet, Take 1 tablet by mouth 4 (four) times daily as needed for diarrhea or loose stools., Disp: 60 tablet, Rfl: 0 .  esomeprazole (NEXIUM) 40 MG capsule, Take 40 mg by mouth daily as needed. , Disp: , Rfl:  .  glyBURIDE-metformin (GLUCOVANCE) 5-500 MG tablet, Take 0.5 tablets by mouth See admin instructions. Takes 0.5 tablet twice daily in morning and bedtime and takes a 3rd dose only if needed for elevated blood sugar, Disp: , Rfl:  .  LORazepam (ATIVAN) 0.5 MG tablet, Take 1 tablet (0.5 mg total) by mouth every 6 (six) hours as needed (Nausea or vomiting)., Disp: 30 tablet, Rfl: 0 .  magic mouthwash SOLN, Take 5 mLs by mouth 4 (four) times daily as needed for mouth pain., Disp: 420 mL, Rfl: 0 .  MORPHINE SULFATE IJ, Inject as directed. Morphine 4mg /ml epidural pump. 1.4897 mg/day, Disp: , Rfl:  .  niacin 500 MG tablet, Take 500 mg by mouth at bedtime., Disp: , Rfl:  .  ondansetron (ZOFRAN) 8 MG tablet, Take 1 tablet (8 mg total) by mouth 2 (two) times daily as needed. Start on the third day after chemotherapy., Disp: 30 tablet, Rfl: 1 .  oxymetazoline (AFRIN) 0.05 % nasal spray, Place 1 spray into both nostrils 2 (two) times daily as needed for congestion., Disp: , Rfl:  .  pregabalin (LYRICA) 150 MG capsule, Take 150 mg by mouth 2 (two) times daily., Disp: , Rfl:  .  prochlorperazine (COMPAZINE) 10 MG tablet, Take 1 tablet (10 mg total) by mouth every 6 (six) hours as needed (Nausea or vomiting)., Disp: 30 tablet, Rfl: 1 .  silver sulfADIAZINE (SILVADENE) 1 % cream, Apply 1 application topically 2 (two) times daily., Disp: 50 g, Rfl: 0 .  sucralfate (CARAFATE) 1 g tablet, Take 1 tablet (1 g total) 3 (three) times daily by mouth. Dissolve in 2-3 tbsp warm water, swish and swallow., Disp: 90  tablet, Rfl: 3 .  sulfamethoxazole-trimethoprim (BACTRIM DS,SEPTRA DS) 800-160 MG tablet, Take 1 tablet by mouth 2 (two) times daily for 10 days., Disp: 20 tablet, Rfl: 0 .  thyroid (ARMOUR) 32.5 MG tablet, Take 32.5 mg by mouth every morning. ,  Disp: , Rfl:  .  tiZANidine (ZANAFLEX) 4 MG tablet, Take 4 mg by mouth every 8 (eight) hours as needed for muscle spasms., Disp: , Rfl:  .  vitamin E (VITAMIN E) 400 UNIT capsule, Take 400 Units by mouth daily., Disp: , Rfl:  .  zinc gluconate 50 MG tablet, Take 50 mg by mouth daily., Disp: , Rfl:  .  HYDROcodone-acetaminophen (NORCO) 7.5-325 MG tablet, Take 1 tablet by mouth every 6 (six) hours as needed for moderate pain. , Disp: , Rfl:  .  Insulin Glargine (BASAGLAR KWIKPEN) 100 UNIT/ML SOPN, Inject 26 Units 2 (two) times daily into the skin. , Disp: , Rfl:  .  KRILL OIL PO, Take 1 capsule by mouth daily. , Disp: , Rfl:  .  lidocaine-prilocaine (EMLA) cream, Apply to affected area once, Disp: 30 g, Rfl: 3 No current facility-administered medications for this visit.   Facility-Administered Medications Ordered in Other Visits:  .  0.9 %  sodium chloride infusion, , Intravenous, Continuous, Burns, Wandra Feinstein, NP, Stopped at 11/11/17 1336 .  heparin lock flush 100 unit/mL, 500 Units, Intravenous, Once, Sindy Guadeloupe, MD .  ondansetron [facility]) tablet 4 mg, 4 mg, Oral, Once, Sindy Guadeloupe, MD  Physical exam:  Vitals:   12/09/17 1406  BP: 111/70  Pulse: (!) 130  Resp: 20  Temp: (!) 97.3 F (36.3 C)  TempSrc: Tympanic  Weight: 197 lb (89.4 kg)   Physical Exam  Constitutional: He is oriented to person, place, and time.  Fatigued, appears dehydrated  HENT:  Head: Normocephalic and atraumatic.  Mucous membranes appear dry.  Grade 1 mucositis noted  Eyes: EOM are normal. Pupils are equal, round, and reactive to light.  Neck: Normal range of motion.  Cardiovascular: Regular rhythm and normal heart sounds.  tachycardic  Pulmonary/Chest: Effort  normal and breath sounds normal.  Abdominal: Soft. Bowel sounds are normal.  Lymphadenopathy:  No palpable cervical adenopathy  Neurological: He is alert and oriented to person, place, and time.  Skin: Skin is warm and dry.     CMP Latest Ref Rng & Units 12/09/2017  Glucose 65 - 99 mg/dL 250(H)  BUN 6 - 20 mg/dL 38(H)  Creatinine 0.61 - 1.24 mg/dL 1.63(H)  Sodium 135 - 145 mmol/L 131(L)  Potassium 3.5 - 5.1 mmol/L 4.2  Chloride 101 - 111 mmol/L 95(L)  CO2 22 - 32 mmol/L 21(L)  Calcium 8.9 - 10.3 mg/dL 9.7  Total Protein 6.5 - 8.1 g/dL 7.5  Total Bilirubin 0.3 - 1.2 mg/dL 1.6(H)  Alkaline Phos 38 - 126 U/L 90  AST 15 - 41 U/L 329(H)  ALT 17 - 63 U/L 334(H)   CBC Latest Ref Rng & Units 12/09/2017  WBC 3.8 - 10.6 K/uL 5.3  Hemoglobin 13.0 - 18.0 g/dL 13.5  Hematocrit 40.0 - 52.0 % 40.0  Platelets 150 - 440 K/uL 219     Assessment and plan- Patient is a 62 y.o. male with SCC of oropharynx Stage I T2N1M0 status post 7 cycles of weekly cisplatin and radiation here for ongoing nausea and vomiting  It has been more than 2 weeks since his last chemotherapy.  He did complete radiation about a week to 10 days ago.  Nausea/vomiting could be secondary to radiation as well.  Today I will give him 2 L of IV fluids along with 10 mg IV Decadron and 4 mg of IV Zofran.  Labs do reveal evidence of acute kidney injury given his creatinine is elevated at  1.63.  Also his AST and ALT is elevated in the 300s along with total bilirubin of 1.6.  He will come back for IV fluids again on 12/12/2017 as well as next week.  We will repeat his CMP at that time if his LFTs continue to be abnormal I will get a CT abdomen and pelvis with or without contrast to a certain any other etiology for his nausea vomiting and consider referring him to GI at that point   Patient is scheduled for PET/CT scan in February 2019.  I will tentatively see him back in 3 weeks time with a CBC and a CMP    Visit Diagnosis 1. Dehydration     2. Oropharyngeal cancer ([place])   3. Abnormal LFTs   4. Acute renal failure, unspecified acute renal failure type [facility])      Dr. Randa Evens, MD, MPH [facility] Pager- [phone] 12/09/2017 3:26 PM

## 2017-12-12 ENCOUNTER — Inpatient Hospital Stay: Payer: Federal, State, Local not specified - PPO

## 2017-12-12 DIAGNOSIS — C109 Malignant neoplasm of oropharynx, unspecified: Secondary | ICD-10-CM

## 2017-12-12 DIAGNOSIS — Z95828 Presence of other vascular implants and grafts: Secondary | ICD-10-CM

## 2017-12-12 DIAGNOSIS — E860 Dehydration: Secondary | ICD-10-CM

## 2017-12-12 MED ORDER — SODIUM CHLORIDE 0.9% FLUSH
10.0000 mL | INTRAVENOUS | Status: DC | PRN
  Administered 2017-12-12: 10 mL via INTRAVENOUS
  Filled 2017-12-12: qty 10

## 2017-12-12 MED ORDER — HEPARIN SOD (PORK) LOCK FLUSH 100 UNIT/ML IV SOLN
500.0000 [IU] | Freq: Once | INTRAVENOUS | Status: AC
  Administered 2017-12-12: 500 [IU] via INTRAVENOUS

## 2017-12-12 MED ORDER — SODIUM CHLORIDE 0.9 % IV SOLN
Freq: Once | INTRAVENOUS | Status: AC
  Administered 2017-12-12: 13:00:00 via INTRAVENOUS
  Filled 2017-12-12: qty 1000

## 2017-12-16 ENCOUNTER — Inpatient Hospital Stay: Payer: Federal, State, Local not specified - PPO

## 2017-12-16 ENCOUNTER — Other Ambulatory Visit: Payer: Self-pay | Admitting: *Deleted

## 2017-12-16 ENCOUNTER — Other Ambulatory Visit: Payer: Self-pay | Admitting: Oncology

## 2017-12-16 ENCOUNTER — Telehealth: Payer: Self-pay | Admitting: *Deleted

## 2017-12-16 VITALS — BP 121/78 | HR 80 | Temp 97.4°F | Resp 19

## 2017-12-16 DIAGNOSIS — C109 Malignant neoplasm of oropharynx, unspecified: Secondary | ICD-10-CM

## 2017-12-16 DIAGNOSIS — R945 Abnormal results of liver function studies: Secondary | ICD-10-CM

## 2017-12-16 DIAGNOSIS — E860 Dehydration: Secondary | ICD-10-CM

## 2017-12-16 DIAGNOSIS — R7989 Other specified abnormal findings of blood chemistry: Secondary | ICD-10-CM

## 2017-12-16 LAB — CBC WITH DIFFERENTIAL/PLATELET
Basophils Absolute: 0.1 10*3/uL (ref 0–0.1)
Basophils Relative: 4 %
Eosinophils Absolute: 0 10*3/uL (ref 0–0.7)
Eosinophils Relative: 1 %
HCT: 37 % — ABNORMAL LOW (ref 40.0–52.0)
Hemoglobin: 12.4 g/dL — ABNORMAL LOW (ref 13.0–18.0)
Lymphocytes Relative: 16 %
Lymphs Abs: 0.4 10*3/uL — ABNORMAL LOW (ref 1.0–3.6)
MCH: 30.9 pg (ref 26.0–34.0)
MCHC: 33.6 g/dL (ref 32.0–36.0)
MCV: 91.9 fL (ref 80.0–100.0)
Monocytes Absolute: 0.4 10*3/uL (ref 0.2–1.0)
Monocytes Relative: 15 %
Neutro Abs: 1.8 10*3/uL (ref 1.4–6.5)
Neutrophils Relative %: 64 %
Platelets: 296 10*3/uL (ref 150–440)
RBC: 4.02 MIL/uL — ABNORMAL LOW (ref 4.40–5.90)
RDW: 22 % — ABNORMAL HIGH (ref 11.5–14.5)
WBC: 2.9 10*3/uL — ABNORMAL LOW (ref 3.8–10.6)

## 2017-12-16 LAB — COMPREHENSIVE METABOLIC PANEL
ALT: 415 U/L — ABNORMAL HIGH (ref 17–63)
AST: 406 U/L — ABNORMAL HIGH (ref 15–41)
Albumin: 3.6 g/dL (ref 3.5–5.0)
Alkaline Phosphatase: 75 U/L (ref 38–126)
Anion gap: 10 (ref 5–15)
BUN: 20 mg/dL (ref 6–20)
CO2: 24 mmol/L (ref 22–32)
Calcium: 8.8 mg/dL — ABNORMAL LOW (ref 8.9–10.3)
Chloride: 98 mmol/L — ABNORMAL LOW (ref 101–111)
Creatinine, Ser: 1.04 mg/dL (ref 0.61–1.24)
GFR calc Af Amer: >60 mL/min (ref >60)
GFR calc non Af Amer: >60 mL/min (ref >60)
Glucose, Bld: 293 mg/dL — ABNORMAL HIGH (ref 65–99)
Potassium: 4.2 mmol/L (ref 3.5–5.1)
Sodium: 132 mmol/L — ABNORMAL LOW (ref 135–145)
Total Bilirubin: 1.1 mg/dL (ref 0.3–1.2)
Total Protein: 6.7 g/dL (ref 6.5–8.1)

## 2017-12-16 MED ORDER — HEPARIN SOD (PORK) LOCK FLUSH 100 UNIT/ML IV SOLN
500.0000 [IU] | Freq: Once | INTRAVENOUS | Status: AC
  Administered 2017-12-16: 500 [IU] via INTRAVENOUS
  Filled 2017-12-16: qty 5

## 2017-12-16 MED ORDER — SODIUM CHLORIDE 0.9 % IV SOLN
Freq: Once | INTRAVENOUS | Status: AC
  Administered 2017-12-16: 14:00:00 via INTRAVENOUS
  Filled 2017-12-16: qty 1000

## 2017-12-16 MED ORDER — SODIUM CHLORIDE 0.9% FLUSH
10.0000 mL | INTRAVENOUS | Status: DC | PRN
  Administered 2017-12-16: 10 mL via INTRAVENOUS
  Filled 2017-12-16: qty 10

## 2017-12-16 NOTE — Progress Notes (Signed)
Us/

## 2017-12-16 NOTE — Telephone Encounter (Signed)
Spoke to patient via telephone re: elevated liver functions and the need for an ultrasound. Patient is agreeable to go ahead with this.      dhs

## 2017-12-17 ENCOUNTER — Other Ambulatory Visit: Payer: Self-pay | Admitting: *Deleted

## 2017-12-17 ENCOUNTER — Other Ambulatory Visit: Payer: Self-pay | Admitting: Oncology

## 2017-12-17 DIAGNOSIS — C109 Malignant neoplasm of oropharynx, unspecified: Secondary | ICD-10-CM

## 2017-12-17 DIAGNOSIS — R748 Abnormal levels of other serum enzymes: Secondary | ICD-10-CM

## 2017-12-23 ENCOUNTER — Ambulatory Visit
Admission: RE | Admit: 2017-12-23 | Discharge: 2017-12-23 | Disposition: A | Discharge status: Home / Self Care | Payer: Federal, State, Local not specified - PPO | Source: Ambulatory Visit | Attending: Oncology | Admitting: Oncology

## 2017-12-23 DIAGNOSIS — R748 Abnormal levels of other serum enzymes: Secondary | ICD-10-CM | POA: Diagnosis present

## 2017-12-23 DIAGNOSIS — K760 Fatty (change of) liver, not elsewhere classified: Secondary | ICD-10-CM | POA: Insufficient documentation

## 2017-12-23 DIAGNOSIS — C109 Malignant neoplasm of oropharynx, unspecified: Secondary | ICD-10-CM | POA: Diagnosis present

## 2017-12-30 ENCOUNTER — Encounter: Payer: Self-pay | Admitting: Oncology

## 2017-12-30 ENCOUNTER — Inpatient Hospital Stay (HOSPITAL_BASED_OUTPATIENT_CLINIC_OR_DEPARTMENT_OTHER): Payer: Federal, State, Local not specified - PPO | Admitting: Oncology

## 2017-12-30 ENCOUNTER — Inpatient Hospital Stay: Payer: Federal, State, Local not specified - PPO

## 2017-12-30 VITALS — BP 127/84 | HR 105 | Temp 98.6°F | Resp 18 | Wt 219.0 lb

## 2017-12-30 DIAGNOSIS — C109 Malignant neoplasm of oropharynx, unspecified: Secondary | ICD-10-CM | POA: Diagnosis not present

## 2017-12-30 DIAGNOSIS — G8929 Other chronic pain: Secondary | ICD-10-CM

## 2017-12-30 DIAGNOSIS — F419 Anxiety disorder, unspecified: Secondary | ICD-10-CM | POA: Diagnosis not present

## 2017-12-30 DIAGNOSIS — F329 Major depressive disorder, single episode, unspecified: Secondary | ICD-10-CM

## 2017-12-30 DIAGNOSIS — R5383 Other fatigue: Secondary | ICD-10-CM | POA: Diagnosis not present

## 2017-12-30 DIAGNOSIS — E119 Type 2 diabetes mellitus without complications: Secondary | ICD-10-CM

## 2017-12-30 DIAGNOSIS — R112 Nausea with vomiting, unspecified: Secondary | ICD-10-CM

## 2017-12-30 DIAGNOSIS — R7989 Other specified abnormal findings of blood chemistry: Secondary | ICD-10-CM

## 2017-12-30 DIAGNOSIS — Z87891 Personal history of nicotine dependence: Secondary | ICD-10-CM

## 2017-12-30 DIAGNOSIS — C770 Secondary and unspecified malignant neoplasm of lymph nodes of head, face and neck: Secondary | ICD-10-CM

## 2017-12-30 DIAGNOSIS — D649 Anemia, unspecified: Secondary | ICD-10-CM | POA: Diagnosis not present

## 2017-12-30 DIAGNOSIS — M5126 Other intervertebral disc displacement, lumbar region: Secondary | ICD-10-CM

## 2017-12-30 DIAGNOSIS — E860 Dehydration: Secondary | ICD-10-CM

## 2017-12-30 DIAGNOSIS — K760 Fatty (change of) liver, not elsewhere classified: Secondary | ICD-10-CM | POA: Diagnosis not present

## 2017-12-30 DIAGNOSIS — K219 Gastro-esophageal reflux disease without esophagitis: Secondary | ICD-10-CM

## 2017-12-30 DIAGNOSIS — Z79899 Other long term (current) drug therapy: Secondary | ICD-10-CM

## 2017-12-30 DIAGNOSIS — M542 Cervicalgia: Secondary | ICD-10-CM

## 2017-12-30 DIAGNOSIS — R944 Abnormal results of kidney function studies: Secondary | ICD-10-CM

## 2017-12-30 DIAGNOSIS — R531 Weakness: Secondary | ICD-10-CM | POA: Diagnosis not present

## 2017-12-30 DIAGNOSIS — Z923 Personal history of irradiation: Secondary | ICD-10-CM

## 2017-12-30 DIAGNOSIS — R948 Abnormal results of function studies of other organs and systems: Secondary | ICD-10-CM

## 2017-12-30 DIAGNOSIS — I4891 Unspecified atrial fibrillation: Secondary | ICD-10-CM

## 2017-12-30 DIAGNOSIS — E039 Hypothyroidism, unspecified: Secondary | ICD-10-CM

## 2017-12-30 DIAGNOSIS — R945 Abnormal results of liver function studies: Secondary | ICD-10-CM

## 2017-12-30 DIAGNOSIS — G709 Myoneural disorder, unspecified: Secondary | ICD-10-CM

## 2017-12-30 DIAGNOSIS — Z86010 Personal history of colonic polyps: Secondary | ICD-10-CM

## 2017-12-30 DIAGNOSIS — I10 Essential (primary) hypertension: Secondary | ICD-10-CM

## 2017-12-30 LAB — COMPREHENSIVE METABOLIC PANEL
ALT: 24 U/L (ref 17–63)
AST: 25 U/L (ref 15–41)
Albumin: 3.7 g/dL (ref 3.5–5.0)
Alkaline Phosphatase: 56 U/L (ref 38–126)
Anion gap: 12 (ref 5–15)
BUN: 11 mg/dL (ref 6–20)
CO2: 21 mmol/L — ABNORMAL LOW (ref 22–32)
Calcium: 9 mg/dL (ref 8.9–10.3)
Chloride: 102 mmol/L (ref 101–111)
Creatinine, Ser: 1.09 mg/dL (ref 0.61–1.24)
GFR calc Af Amer: >60 mL/min (ref >60)
GFR calc non Af Amer: >60 mL/min (ref >60)
Glucose, Bld: 255 mg/dL — ABNORMAL HIGH (ref 65–99)
Potassium: 3.9 mmol/L (ref 3.5–5.1)
Sodium: 135 mmol/L (ref 135–145)
Total Bilirubin: 0.9 mg/dL (ref 0.3–1.2)
Total Protein: 6.7 g/dL (ref 6.5–8.1)

## 2017-12-30 LAB — CBC WITH DIFFERENTIAL/PLATELET
Basophils Absolute: 0 10*3/uL (ref 0–0.1)
Basophils Relative: 1 %
Eosinophils Absolute: 0.1 10*3/uL (ref 0–0.7)
Eosinophils Relative: 1 %
HCT: 31.4 % — ABNORMAL LOW (ref 40.0–52.0)
Hemoglobin: 10.8 g/dL — ABNORMAL LOW (ref 13.0–18.0)
Lymphocytes Relative: 19 %
Lymphs Abs: 0.9 10*3/uL — ABNORMAL LOW (ref 1.0–3.6)
MCH: 31.4 pg (ref 26.0–34.0)
MCHC: 34.3 g/dL (ref 32.0–36.0)
MCV: 91.7 fL (ref 80.0–100.0)
Monocytes Absolute: 0.7 10*3/uL (ref 0.2–1.0)
Monocytes Relative: 14 %
Neutro Abs: 3.1 10*3/uL (ref 1.4–6.5)
Neutrophils Relative %: 65 %
Platelets: 232 10*3/uL (ref 150–440)
RBC: 3.42 MIL/uL — ABNORMAL LOW (ref 4.40–5.90)
RDW: 20.2 % — ABNORMAL HIGH (ref 11.5–14.5)
WBC: 4.8 10*3/uL (ref 3.8–10.6)

## 2017-12-30 NOTE — Progress Notes (Signed)
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 Patient is a 62 year old male who was found to have a right neck mass about 1 year ago when he underwent morphine pump placement. At that time he was seen by ENT and also went through antibiotic course but was not found to have a malignancy back then. Patient recently underwent a hemicolectomy by Dr. Jamal Collin as he was found to have a colonic polyp which was not resectable on colonoscopy. Shortly following the surgery patient again noticed right sided neck mass. He wasttreated with empiric antibiotics by Dr. Doy Hutching but the mass did not go away and he was referred to Dr. Tami Ribas. Patient was found to have tonsillar asymmetry on NPL exam and underwent CT neck  2. CT soft tissue of the neck. Multiple right-sided lymph nodes at level IIA and 2B between 1.5-1.8 cm in size which were suspicious in appearance.also noted to have started asymmetric soft tissue fullness in the oropharynx or on the right lateral wall in the right palatine tonsil region measuring up to 2.7 cm.  3. 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 This was followed by a PET CT scan which showed right palatine tonsillar mass with a maximum SUV of 8.5 compatible with malignancy. Several right level IIA lymph nodes are present and are hypermetabolic,  larger node measuring 1.6 cm in short axis. No evidence of metastatic spread to the chest abdomen and pelvis or visualized skeleton  5. Patient has been referred to [place] for further management. Currently patient reports some pain and discomfort in his right neck. Denies any difficulty swallowing. His appetite is good and he does not report any unintentional weight loss. He does have chronic pain for which he has a morphine pump in place  6. Cycle 7 of weekly cisplatin completed on 11/18/17   Interval history- feels a lot better over last 10 days. Appetite is coming back and he is able to eat steak and ham sandwich. Denies any nausea or vomiting. Blood sugars are under better control.   ECOG PS- 0 Pain scale- 0 Opioid associated constipation- no  Review of systems- Review of Systems  Constitutional: Negative for chills, fever, malaise/fatigue and weight loss.  HENT: Negative for congestion, ear discharge and nosebleeds.   Eyes: Negative for blurred vision.  Respiratory: Negative for cough, hemoptysis, sputum production, shortness of breath and wheezing.   Cardiovascular: Negative for chest pain, palpitations, orthopnea and claudication.  Gastrointestinal: Negative for abdominal pain, blood in stool, constipation, diarrhea, heartburn, melena, nausea and vomiting.  Genitourinary: Negative for dysuria, flank pain, frequency, hematuria and urgency.  Musculoskeletal: Negative for back pain, joint pain and myalgias.  Skin: Negative for rash.  Neurological: Negative for dizziness, tingling, focal weakness, seizures, weakness and headaches.  Endo/Heme/Allergies: Does not bruise/bleed easily.  Psychiatric/Behavioral: Negative for depression and suicidal ideas. The patient does not have insomnia.       Allergies  Allergen Reactions  .  Biaxin [Clarithromycin] Other (See Comments)    Severe hiccups  . Ceftin [Cefuroxime Axetil] Other (See Comments)    Severe hiccups  . Penicillins Hives     Has patient had a PCN reaction causing immediate rash, facial/tongue/throat swelling, SOB or lightheadedness with hypotension: No Has patient had a PCN reaction causing severe rash involving mucus membranes or skin necrosis: unknown Has patient had a PCN reaction that required hospitalization: No Has patient had a PCN reaction occurring within the last 10 years: No If all of the above answers are "NO", then may proceed with Cephalosporin use.      Past Medical History:  Diagnosis Date  . Anxiety   . Arthritis   . Cancer ([place]) 08/21/2017  . Chronic pain 1996   due to work injury  . Colon polyp    adenomatous polyp ascending colon  . Cough    THAT WONT GO AWAY-PT TO SEE DR Doy Hutching ON 06-10-17 @ 4 PM  . Depression   . Diabetes mellitus without complication ([place])   . Dysrhythmia    AFIB X 1  . GERD (gastroesophageal reflux disease)   . Herniated lumbar intervertebral disc   . Hypertension   . Hypothyroidism   . Neck pain   . Neuromuscular disorder ([place])   . Presence of intrathecal pump    Dr. Mechele Dawley in [place], [place]     Past Surgical History:  Procedure Laterality Date  . COLONOSCOPY  2013   [place]  . COLONOSCOPY WITH PROPOFOL N/A 04/22/2017   Procedure: COLONOSCOPY WITH PROPOFOL;  Surgeon: Lollie Sails, MD;  Location: [facility] ENDOSCOPY;  Service: Endoscopy;  Laterality: N/A;  . IMAGE GUIDED SINUS SURGERY    . implanted morphine pump  2016  . IR FLUORO GUIDE PORT INSERTION LEFT  09/26/2017  . LAPAROSCOPIC CHOLECYSTECTOMY  2013  . PARTIAL COLECTOMY Right 06/17/2017   Procedure: PARTIAL COLECTOMY;  Surgeon: Christene Lye, MD;  Location: [facility] ORS;  Service: General;  Laterality: Right;  . UMBILICAL HERNIA REPAIR  2013  . UMBILICAL HERNIA REPAIR  06/17/2017   Procedure: HERNIA REPAIR UMBILICAL ADULT;  Surgeon: Christene Lye, MD;  Location: [facility] ORS;  Service: General;;  . VASECTOMY      Social History   Socioeconomic History  .  Marital status: Married    Spouse name: Not on file  . Number of children: Not on file  . Years of education: Not on file  . Highest education level: Not on file  Social Needs  . Financial resource strain: Not on file  . Food insecurity - worry: Not on file  . Food insecurity - inability: Not on file  . Transportation needs - medical: Not on file  . Transportation needs - non-medical: Not on file  Occupational History  . Not on file  Tobacco Use  . Smoking status: Former Smoker    Years: 20.00    Types: Cigarettes    Last attempt to quit: 12/03/1999    Years since quitting: 18.0  . Smokeless tobacco: Never Used  Substance and Sexual Activity  . Alcohol use: No  . Drug use: No  . Sexual activity: Yes  Other Topics Concern  . Not on file  Social History Narrative  . Not on file    Family History  Problem Relation Age of Onset  . Breast cancer Cousin 26  . Cancer - Colon Neg Hx   . Prostate cancer Neg Hx      Current Outpatient Medications:  .  amitriptyline (ELAVIL) 100 MG tablet, Take 50 mg by mouth at bedtime as needed for sleep. , Disp: , Rfl:  .  apixaban (ELIQUIS) 5 MG TABS tablet, Take 1 tablet 2 (two) times daily by mouth., Disp: , Rfl:  .  Baclofen 5 MG TABS, TAKE 1 TABLET BY MOUTH THREE TIMES DAILY FOR HICCUPS, Disp: 270 tablet, Rfl: 0 .  Cholecalciferol (VITAMIN D3) 5000 units TABS, Take 5,000 Units by mouth daily. , Disp: , Rfl:  .  citalopram (CELEXA) 40 MG tablet, Take 40 mg by mouth every morning. , Disp: , Rfl:  .  dexamethasone (DECADRON) 4 MG tablet, Take 2 tablets by mouth once a day on the day after chemotherapy and then take 2 tablets two times a day for 2 days. Take with food., Disp: 30 tablet, Rfl: 1 .  diltiazem (CARDIZEM CD) 240 MG 24 hr capsule, Take 1 capsule daily by mouth., Disp: , Rfl:  .  diphenoxylate-atropine (LOMOTIL) 2.5-0.025 MG tablet, Take 1 tablet by mouth 4 (four) times daily as needed for diarrhea or loose stools., Disp: 60 tablet,  Rfl: 0 .  esomeprazole (NEXIUM) 40 MG capsule, Take 40 mg by mouth daily as needed. , Disp: , Rfl:  .  glyBURIDE-metformin (GLUCOVANCE) 5-500 MG tablet, Take 0.5 tablets by mouth See admin instructions. Takes 0.5 tablet twice daily in morning and bedtime and takes a 3rd dose only if needed for elevated blood sugar, Disp: , Rfl:  .  HYDROcodone-acetaminophen (NORCO) 7.5-325 MG tablet, Take 1 tablet by mouth every 6 (six) hours as needed for moderate pain. , Disp: , Rfl:  .  Insulin Glargine (BASAGLAR KWIKPEN) 100 UNIT/ML SOPN, Inject 26 Units 2 (two) times daily into the skin. , Disp: , Rfl:  .  KRILL OIL PO, Take 1 capsule by mouth daily. , Disp: , Rfl:  .  lidocaine-prilocaine (EMLA) cream, Apply to affected area once, Disp: 30 g, Rfl: 3 .  LORazepam (ATIVAN) 0.5 MG tablet, Take 1 tablet (0.5 mg total) by mouth every 6 (six) hours as needed (Nausea or vomiting)., Disp: 30 tablet, Rfl: 0 .  magic mouthwash SOLN, Take 5 mLs by mouth 4 (four) times daily as needed for mouth pain., Disp: 420 mL, Rfl: 0 .  MORPHINE SULFATE IJ, Inject as directed. Morphine 4mg /ml epidural pump. 1.4897 mg/day, Disp: , Rfl:  .  niacin 500 MG tablet, Take 500 mg by mouth at bedtime., Disp: , Rfl:  .  ondansetron (ZOFRAN) 8 MG tablet, Take 1 tablet (8 mg total) by mouth 2 (two) times daily as needed. Start on the third day after chemotherapy., Disp: 30 tablet, Rfl: 1 .  oxymetazoline (AFRIN) 0.05 % nasal spray, Place 1 spray into both nostrils 2 (two) times daily as needed for congestion., Disp: , Rfl:  .  pregabalin (LYRICA) 150 MG capsule, Take 150 mg by mouth 2 (two) times daily., Disp: , Rfl:  .  prochlorperazine (COMPAZINE) 10 MG tablet, Take 1 tablet (10 mg total) by mouth every 6 (six) hours as needed (Nausea or vomiting)., Disp: 30 tablet, Rfl: 1 .  silver sulfADIAZINE (SILVADENE) 1 % cream, Apply 1 application topically 2 (two) times daily., Disp: 50 g, Rfl: 0 .  sucralfate (CARAFATE) 1 g tablet, Take 1 tablet (1 g  total) 3 (three) times daily by mouth. Dissolve in 2-3 tbsp warm water, swish and swallow., Disp: 90 tablet, Rfl: 3 .  thyroid (ARMOUR) 32.5 MG tablet, Take 32.5 mg by mouth every morning. , Disp: , Rfl:  .  tiZANidine (ZANAFLEX) 4 MG tablet, Take 4 mg by mouth every 8 (eight) hours as needed for muscle spasms., Disp: , Rfl:  .  vitamin E (VITAMIN E) 400 UNIT capsule, Take 400 Units by mouth daily., Disp: , Rfl:  .  zinc gluconate 50 MG tablet, Take 50 mg by mouth daily., Disp: , Rfl:  No current facility-administered medications for this visit.   Facility-Administered Medications Ordered in Other Visits:  .  0.9 %  sodium chloride infusion, , Intravenous, Continuous, Burns, Wandra Feinstein, NP, Stopped at 11/11/17 1336 .  heparin lock flush 100 unit/mL, 500 Units, Intravenous, Once, Sindy Guadeloupe, MD  Physical exam:  Vitals:   12/30/17 1031  BP: 127/84  Pulse: (!) 105  Resp: 18  Temp: 98.6 F (37 C)  TempSrc: Tympanic  Weight: 219 lb (99.3 kg)   Physical Exam  Constitutional: He is oriented to person, place, and time and well-developed, well-nourished, and in no distress.  HENT:  Head: Normocephalic and atraumatic.  Eyes: EOM are normal. Pupils are equal, round, and reactive to light.  Neck: Normal range of motion.  Cardiovascular: Regular rhythm and normal heart sounds.  tachycardic  Pulmonary/Chest: Effort normal and breath sounds normal.  Abdominal: Soft. Bowel sounds are normal.  Lymphadenopathy:  No palpable cervical adenopathy  Neurological: He is alert and oriented to person, place, and time.  Skin: Skin is warm and dry.     CMP Latest Ref Rng & Units 12/30/2017  Glucose 65 - 99 mg/dL PENDING  BUN 6 - 20 mg/dL 11  Creatinine 0.61 - 1.24 mg/dL 1.09  Sodium 135 - 145 mmol/L PENDING  Potassium 3.5 - 5.1 mmol/L PENDING  Chloride 101 - 111 mmol/L PENDING  CO2 22 - 32 mmol/L PENDING  Calcium 8.9 - 10.3 mg/dL PENDING  Total Protein 6.5 - 8.1 g/dL 6.7  Total Bilirubin 0.3 -  1.2 mg/dL 0.9  Alkaline Phos 38 - 126 U/L 56  AST 15 - 41 U/L 25  ALT 17 - 63 U/L 24   CBC Latest Ref Rng & Units 12/30/2017  WBC 3.8 - 10.6 K/uL 4.8  Hemoglobin 13.0 - 18.0 g/dL 10.8(L)  Hematocrit 40.0 - 52.0 % 31.4(L)  Platelets 150 - 440 K/uL 232    No images are attached to the encounter.  US Abdomen Limited Ruq  Result Date: 12/23/2017 CLINICAL DATA:  Abnormal LFTs EXAM: ULTRASOUND ABDOMEN LIMITED RIGHT UPPER QUADRANT COMPARISON:  09/10/2017 FINDINGS: Gallbladder: Surgically removed Common bile duct: Diameter: 5 mm Liver: Diffusely increased in echogenicity consistent with fatty infiltration. No focal mass lesion is noted. Portal vein is patent on color Doppler imaging with normal direction of blood flow towards the liver. IMPRESSION: Fatty liver. Electronically Signed   By: Inez Catalina M.D.   On: 12/23/2017 08:12     Assessment and plan- Patient is a 62 y.o. male with SCC of oropharynx Stage I T2N1M0 status post 7 cycles of weekly cisplatin and radiation   1. Abnormal LFT's: Patient had ultrasound of the right upper quadrant on 12/23/2088 which showed fatty liver but no other abnormalities. Repeat LFTs today are normal  2. Nausea/vomiting: resolved  3. Head and neck cancer- s/p chemo/RT. PET on 01/20/18. I will discuss his case at tumor board and see him thereafter with cbc, cmp. I will touch base with Dr. Con Memos regarding his follow up  4. Normocytic anemia- likely due to acute illness. Suspect his liver functions were also abnormal due to same reason. I expect his anemia  to improve in 4-6 weeks    Visit Diagnosis 1. Oropharyngeal cancer ([place])   2. Abnormal LFTs   3. Normocytic anemia      Dr. Randa Evens, MD, MPH [facility] at [facility] Pager- [phone] 12/30/2017 11:20 AM

## 2018-01-07 ENCOUNTER — Ambulatory Visit
Admission: RE | Admit: 2018-01-07 | Discharge: 2018-01-07 | Disposition: A | Discharge status: Home / Self Care | Payer: Federal, State, Local not specified - PPO | Source: Ambulatory Visit | Attending: Radiation Oncology | Admitting: Radiation Oncology

## 2018-01-07 ENCOUNTER — Other Ambulatory Visit: Payer: Self-pay

## 2018-01-07 ENCOUNTER — Encounter: Payer: Self-pay | Admitting: Radiation Oncology

## 2018-01-07 VITALS — BP 143/92 | HR 80 | Temp 97.0°F | Resp 20 | Wt 218.3 lb

## 2018-01-07 DIAGNOSIS — Z85818 Personal history of malignant neoplasm of other sites of lip, oral cavity, and pharynx: Secondary | ICD-10-CM | POA: Insufficient documentation

## 2018-01-07 DIAGNOSIS — Z87891 Personal history of nicotine dependence: Secondary | ICD-10-CM | POA: Insufficient documentation

## 2018-01-07 DIAGNOSIS — Z923 Personal history of irradiation: Secondary | ICD-10-CM | POA: Insufficient documentation

## 2018-01-07 DIAGNOSIS — C099 Malignant neoplasm of tonsil, unspecified: Secondary | ICD-10-CM

## 2018-01-07 NOTE — Progress Notes (Signed)
Radiation Oncology Follow up Note  Name: Stephen Yu   Date:   01/07/2018 MRN:  [id number] DOB: Jan 23, 1956    This 62 y.o. male presents to the clinic today for one-month follow-up status post concurrent chemoradiation therapy for P 16 positive locally advanced squamous cell carcinoma of the right tonsil.  REFERRING PROVIDER: Idelle Crouch, MD  HPI: Patient is a 62 year old male now out 1 month having completed concurrent chemoradiation for locally advanced squamous cell carcinoma P 16 positive of the right tonsillar fossa.. This was clinically stage IIIa (T3 N1 M0). He is seen today in routine follow-up and is improving. He is having very little dysphagia at this time and no head and neck pain. His weight has stabilized taste is still slightly off. He has not yet seen ENT.  COMPLICATIONS OF TREATMENT: none  FOLLOW UP COMPLIANCE: keeps appointments   PHYSICAL EXAM:  BP (!) 143/92   Pulse 80   Temp (!) 97 F (36.1 C)   Resp 20   Wt 218 lb 4.1 oz (99 kg)   BMI 31.77 kg/m  Oral cavity is clear no oral mucosal lesions are identified. Tonsillar regions within normal limits. Indirect mirror examination shows vallecula and base of tongue within normal limits. Neck is clear without evidence of subject gastric cervical or supraclavicular adenopathy. Well-developed well-nourished patient in NAD. HEENT reveals PERLA, EOMI, discs not visualized.  Oral cavity is clear. No oral mucosal lesions are identified. Neck is clear without evidence of cervical or supraclavicular adenopathy. Lungs are clear to A&P. Cardiac examination is essentially unremarkable with regular rate and rhythm without murmur rub or thrill. Abdomen is benign with no organomegaly or masses noted. Motor sensory and DTR levels are equal and symmetric in the upper and lower extremities. Cranial nerves II through XII are grossly intact. Proprioception is intact. No peripheral adenopathy or edema is identified. No motor or sensory  levels are noted. Crude visual fields are within normal range.  RADIOLOGY RESULTS: PET CT scan is being scheduled in one month  PLAN: Present time patient is doing well recovering nicely from his combined modality treatment. He has no evidence of disease at this time. I'm please was overall progress. I've asked to see him back in 3-4 months and will review his PET CT scan at that time. I've asked him to make follow-up appointments with ENT for close observation. Patient knows to call with any concerns.  I would like to take this opportunity to thank you for allowing me to participate in the care of your patient.Noreene Filbert, MD

## 2018-01-20 ENCOUNTER — Ambulatory Visit
Admission: RE | Admit: 2018-01-20 | Discharge: 2018-01-20 | Disposition: A | Discharge status: Home / Self Care | Payer: Federal, State, Local not specified - PPO | Source: Ambulatory Visit | Attending: Oncology | Admitting: Oncology

## 2018-01-20 DIAGNOSIS — N289 Disorder of kidney and ureter, unspecified: Secondary | ICD-10-CM | POA: Insufficient documentation

## 2018-01-20 DIAGNOSIS — Z9889 Other specified postprocedural states: Secondary | ICD-10-CM | POA: Diagnosis not present

## 2018-01-20 DIAGNOSIS — C109 Malignant neoplasm of oropharynx, unspecified: Secondary | ICD-10-CM | POA: Insufficient documentation

## 2018-01-20 DIAGNOSIS — I700 Atherosclerosis of aorta: Secondary | ICD-10-CM | POA: Diagnosis not present

## 2018-01-20 DIAGNOSIS — I2510 Atherosclerotic heart disease of native coronary artery without angina pectoris: Secondary | ICD-10-CM | POA: Diagnosis not present

## 2018-01-20 LAB — GLUCOSE, CAPILLARY: Glucose-Capillary: 172 mg/dL — ABNORMAL HIGH (ref 65–99)

## 2018-01-20 MED ORDER — FLUDEOXYGLUCOSE F - 18 (FDG) INJECTION
12.0000 | Freq: Once | INTRAVENOUS | Status: AC
  Administered 2018-01-20: 12.36 via INTRAVENOUS

## 2018-01-22 ENCOUNTER — Inpatient Hospital Stay: Payer: Federal, State, Local not specified - PPO | Attending: Oncology

## 2018-01-22 DIAGNOSIS — Z86010 Personal history of colonic polyps: Secondary | ICD-10-CM | POA: Insufficient documentation

## 2018-01-22 DIAGNOSIS — F329 Major depressive disorder, single episode, unspecified: Secondary | ICD-10-CM | POA: Insufficient documentation

## 2018-01-22 DIAGNOSIS — I4891 Unspecified atrial fibrillation: Secondary | ICD-10-CM | POA: Insufficient documentation

## 2018-01-22 DIAGNOSIS — Z9221 Personal history of antineoplastic chemotherapy: Secondary | ICD-10-CM | POA: Insufficient documentation

## 2018-01-22 DIAGNOSIS — I10 Essential (primary) hypertension: Secondary | ICD-10-CM | POA: Insufficient documentation

## 2018-01-22 DIAGNOSIS — G893 Neoplasm related pain (acute) (chronic): Secondary | ICD-10-CM | POA: Insufficient documentation

## 2018-01-22 DIAGNOSIS — I2510 Atherosclerotic heart disease of native coronary artery without angina pectoris: Secondary | ICD-10-CM | POA: Insufficient documentation

## 2018-01-22 DIAGNOSIS — Z803 Family history of malignant neoplasm of breast: Secondary | ICD-10-CM | POA: Insufficient documentation

## 2018-01-22 DIAGNOSIS — E119 Type 2 diabetes mellitus without complications: Secondary | ICD-10-CM | POA: Insufficient documentation

## 2018-01-22 DIAGNOSIS — C778 Secondary and unspecified malignant neoplasm of lymph nodes of multiple regions: Secondary | ICD-10-CM | POA: Insufficient documentation

## 2018-01-22 DIAGNOSIS — D6481 Anemia due to antineoplastic chemotherapy: Secondary | ICD-10-CM | POA: Insufficient documentation

## 2018-01-22 DIAGNOSIS — K219 Gastro-esophageal reflux disease without esophagitis: Secondary | ICD-10-CM | POA: Insufficient documentation

## 2018-01-22 DIAGNOSIS — Z87891 Personal history of nicotine dependence: Secondary | ICD-10-CM | POA: Insufficient documentation

## 2018-01-22 DIAGNOSIS — F419 Anxiety disorder, unspecified: Secondary | ICD-10-CM | POA: Insufficient documentation

## 2018-01-22 DIAGNOSIS — Z79899 Other long term (current) drug therapy: Secondary | ICD-10-CM | POA: Insufficient documentation

## 2018-01-22 DIAGNOSIS — E039 Hypothyroidism, unspecified: Secondary | ICD-10-CM | POA: Insufficient documentation

## 2018-01-22 DIAGNOSIS — C109 Malignant neoplasm of oropharynx, unspecified: Secondary | ICD-10-CM | POA: Insufficient documentation

## 2018-01-22 DIAGNOSIS — G8929 Other chronic pain: Secondary | ICD-10-CM | POA: Insufficient documentation

## 2018-01-23 ENCOUNTER — Other Ambulatory Visit: Payer: Self-pay

## 2018-01-23 ENCOUNTER — Encounter: Payer: Self-pay | Admitting: Oncology

## 2018-01-23 ENCOUNTER — Inpatient Hospital Stay (HOSPITAL_BASED_OUTPATIENT_CLINIC_OR_DEPARTMENT_OTHER): Payer: Federal, State, Local not specified - PPO | Admitting: Oncology

## 2018-01-23 ENCOUNTER — Inpatient Hospital Stay: Payer: Federal, State, Local not specified - PPO

## 2018-01-23 VITALS — BP 126/87 | HR 92 | Temp 97.1°F | Resp 12 | Ht 69.5 in | Wt 213.0 lb

## 2018-01-23 DIAGNOSIS — Z803 Family history of malignant neoplasm of breast: Secondary | ICD-10-CM | POA: Diagnosis not present

## 2018-01-23 DIAGNOSIS — E039 Hypothyroidism, unspecified: Secondary | ICD-10-CM

## 2018-01-23 DIAGNOSIS — G893 Neoplasm related pain (acute) (chronic): Secondary | ICD-10-CM

## 2018-01-23 DIAGNOSIS — F329 Major depressive disorder, single episode, unspecified: Secondary | ICD-10-CM | POA: Diagnosis not present

## 2018-01-23 DIAGNOSIS — Z86010 Personal history of colonic polyps: Secondary | ICD-10-CM | POA: Diagnosis not present

## 2018-01-23 DIAGNOSIS — D6481 Anemia due to antineoplastic chemotherapy: Secondary | ICD-10-CM | POA: Diagnosis not present

## 2018-01-23 DIAGNOSIS — Z87891 Personal history of nicotine dependence: Secondary | ICD-10-CM | POA: Diagnosis not present

## 2018-01-23 DIAGNOSIS — I4891 Unspecified atrial fibrillation: Secondary | ICD-10-CM

## 2018-01-23 DIAGNOSIS — F419 Anxiety disorder, unspecified: Secondary | ICD-10-CM | POA: Diagnosis not present

## 2018-01-23 DIAGNOSIS — I10 Essential (primary) hypertension: Secondary | ICD-10-CM

## 2018-01-23 DIAGNOSIS — C109 Malignant neoplasm of oropharynx, unspecified: Secondary | ICD-10-CM | POA: Diagnosis not present

## 2018-01-23 DIAGNOSIS — I2510 Atherosclerotic heart disease of native coronary artery without angina pectoris: Secondary | ICD-10-CM

## 2018-01-23 DIAGNOSIS — G8929 Other chronic pain: Secondary | ICD-10-CM | POA: Diagnosis not present

## 2018-01-23 DIAGNOSIS — E119 Type 2 diabetes mellitus without complications: Secondary | ICD-10-CM | POA: Diagnosis not present

## 2018-01-23 DIAGNOSIS — Z79899 Other long term (current) drug therapy: Secondary | ICD-10-CM | POA: Diagnosis not present

## 2018-01-23 DIAGNOSIS — Z9221 Personal history of antineoplastic chemotherapy: Secondary | ICD-10-CM | POA: Diagnosis not present

## 2018-01-23 DIAGNOSIS — C778 Secondary and unspecified malignant neoplasm of lymph nodes of multiple regions: Secondary | ICD-10-CM

## 2018-01-23 DIAGNOSIS — K219 Gastro-esophageal reflux disease without esophagitis: Secondary | ICD-10-CM

## 2018-01-23 LAB — CBC WITH DIFFERENTIAL/PLATELET
Basophils Absolute: 0 10*3/uL (ref 0–0.1)
Basophils Relative: 1 %
Eosinophils Absolute: 0.3 10*3/uL (ref 0–0.7)
Eosinophils Relative: 4 %
HCT: 39.7 % — ABNORMAL LOW (ref 40.0–52.0)
Hemoglobin: 13.5 g/dL (ref 13.0–18.0)
Lymphocytes Relative: 13 %
Lymphs Abs: 0.9 10*3/uL — ABNORMAL LOW (ref 1.0–3.6)
MCH: 30.9 pg (ref 26.0–34.0)
MCHC: 33.9 g/dL (ref 32.0–36.0)
MCV: 91.2 fL (ref 80.0–100.0)
Monocytes Absolute: 0.6 10*3/uL (ref 0.2–1.0)
Monocytes Relative: 8 %
Neutro Abs: 5.1 10*3/uL (ref 1.4–6.5)
Neutrophils Relative %: 74 %
Platelets: 262 10*3/uL (ref 150–440)
RBC: 4.36 MIL/uL — ABNORMAL LOW (ref 4.40–5.90)
RDW: 14.7 % — ABNORMAL HIGH (ref 11.5–14.5)
WBC: 6.9 10*3/uL (ref 3.8–10.6)

## 2018-01-23 LAB — COMPREHENSIVE METABOLIC PANEL
ALT: 19 U/L (ref 17–63)
AST: 20 U/L (ref 15–41)
Albumin: 4.2 g/dL (ref 3.5–5.0)
Alkaline Phosphatase: 66 U/L (ref 38–126)
Anion gap: 8 (ref 5–15)
BUN: 17 mg/dL (ref 6–20)
CO2: 25 mmol/L (ref 22–32)
Calcium: 9.5 mg/dL (ref 8.9–10.3)
Chloride: 100 mmol/L — ABNORMAL LOW (ref 101–111)
Creatinine, Ser: 0.93 mg/dL (ref 0.61–1.24)
GFR calc Af Amer: >60 mL/min (ref >60)
GFR calc non Af Amer: >60 mL/min (ref >60)
Glucose, Bld: 208 mg/dL — ABNORMAL HIGH (ref 65–99)
Potassium: 4.1 mmol/L (ref 3.5–5.1)
Sodium: 133 mmol/L — ABNORMAL LOW (ref 135–145)
Total Bilirubin: 0.8 mg/dL (ref 0.3–1.2)
Total Protein: 7.7 g/dL (ref 6.5–8.1)

## 2018-01-23 MED ORDER — SODIUM CHLORIDE 0.9% FLUSH
10.0000 mL | Freq: Once | INTRAVENOUS | Status: AC
  Administered 2018-01-23: 10 mL via INTRAVENOUS
  Filled 2018-01-23: qty 10

## 2018-01-23 MED ORDER — HEPARIN SOD (PORK) LOCK FLUSH 100 UNIT/ML IV SOLN
500.0000 [IU] | Freq: Once | INTRAVENOUS | Status: AC
  Administered 2018-01-23: 500 [IU] via INTRAVENOUS

## 2018-01-23 NOTE — Progress Notes (Signed)
See follow up. 

## 2018-01-23 NOTE — Progress Notes (Signed)
Hematology/Oncology Consult note 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 Patient is a 62 year old male who was found to have a right neck mass about 1 year ago when he underwent morphine pump placement. At that time he was seen by ENT and also went through antibiotic course but was not found to have a malignancy back then. Patient recently underwent a hemicolectomy by Dr. Jamal Collin as he was found to have a colonic polyp which was not resectable on colonoscopy. Shortly following the surgery patient again noticed right sided neck mass. He wasttreated with empiric antibiotics by Dr. Doy Hutching but the mass did not go away and he was referred to Dr. Tami Ribas. Patient was found to have tonsillar asymmetry on NPL exam and underwent CT neck  2. CT soft tissue of the neck. Multiple right-sided lymph nodes at level IIA and 2B between 1.5-1.8 cm in size which were suspicious in appearance.also noted to have started asymmetric soft tissue fullness in the oropharynx or on the right lateral wall in the right palatine tonsil region measuring up to 2.7 cm.  3. 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 This was followed by a PET CT scan which showed right palatine tonsillar mass with a maximum SUV of 8.5 compatible with malignancy. Several right level IIA lymph nodes are present and  are hypermetabolic, larger node measuring 1.6 cm in short axis. No evidence of metastatic spread to the chest abdomen and pelvis or visualized skeleton  5. Patient has been referred to [place] for further management. Currently patient reports some pain and discomfort in his right neck. Denies any difficulty swallowing. His appetite is good and he does not report any unintentional weight loss. He does have chronic pain for which he has a morphine pump in place  6. Cycle 7 of weekly cisplatin completed on 11/18/17   Interval history-patient is doing well overall and reports that his appetite and taste sensation is improving.  He denies any complaints today  ECOG PS- 0 Pain scale- 0   Review of systems- Review of Systems  Constitutional: Negative for chills, fever, malaise/fatigue and weight loss.  HENT: Negative for congestion, ear discharge and nosebleeds.   Eyes: Negative for blurred vision.  Respiratory: Negative for cough, hemoptysis, sputum production, shortness of breath and wheezing.   Cardiovascular: Negative for chest pain, palpitations, orthopnea and claudication.  Gastrointestinal: Negative for abdominal pain, blood in stool, constipation, diarrhea, heartburn, melena, nausea and vomiting.  Genitourinary: Negative for dysuria, flank pain, frequency, hematuria and urgency.  Musculoskeletal: Negative for back pain, joint pain and myalgias.  Skin: Negative for rash.  Neurological: Negative for dizziness, tingling, focal weakness, seizures, weakness and headaches.  Endo/Heme/Allergies: Does not bruise/bleed easily.  Psychiatric/Behavioral: Negative for depression and suicidal ideas. The patient does not have insomnia.       Allergies  Allergen Reactions  . Biaxin [Clarithromycin] Other (See Comments)    Severe hiccups  . Ceftin [Cefuroxime Axetil] Other (See Comments)  Severe hiccups  . Penicillins Hives    Has patient had a PCN reaction causing immediate rash,  facial/tongue/throat swelling, SOB or lightheadedness with hypotension: No Has patient had a PCN reaction causing severe rash involving mucus membranes or skin necrosis: unknown Has patient had a PCN reaction that required hospitalization: No Has patient had a PCN reaction occurring within the last 10 years: No If all of the above answers are "NO", then may proceed with Cephalosporin use.      Past Medical History:  Diagnosis Date  . Anxiety   . Arthritis   . Cancer ([place]) 08/21/2017  . Chronic pain 1996   due to work injury  . Colon polyp    adenomatous polyp ascending colon  . Cough    THAT WONT GO AWAY-PT TO SEE DR Doy Hutching ON 06-10-17 @ 4 PM  . Depression   . Diabetes mellitus without complication ([place])   . Dysrhythmia    AFIB X 1  . GERD (gastroesophageal reflux disease)   . Herniated lumbar intervertebral disc   . Hypertension   . Hypothyroidism   . Neck pain   . Neuromuscular disorder ([place])   . Presence of intrathecal pump    Dr. Mechele Dawley in [place], [place]     Past Surgical History:  Procedure Laterality Date  . COLONOSCOPY  2013   [place]  . COLONOSCOPY WITH PROPOFOL N/A 04/22/2017   Procedure: COLONOSCOPY WITH PROPOFOL;  Surgeon: Lollie Sails, MD;  Location: [facility] ENDOSCOPY;  Service: Endoscopy;  Laterality: N/A;  . IMAGE GUIDED SINUS SURGERY    . implanted morphine pump  2016  . IR FLUORO GUIDE PORT INSERTION LEFT  09/26/2017  . LAPAROSCOPIC CHOLECYSTECTOMY  2013  . PARTIAL COLECTOMY Right 06/17/2017   Procedure: PARTIAL COLECTOMY;  Surgeon: Christene Lye, MD;  Location: [facility] ORS;  Service: General;  Laterality: Right;  . UMBILICAL HERNIA REPAIR  2013  . UMBILICAL HERNIA REPAIR  06/17/2017   Procedure: HERNIA REPAIR UMBILICAL ADULT;  Surgeon: Christene Lye, MD;  Location: [facility] ORS;  Service: General;;  . VASECTOMY      Social History   Socioeconomic History  . Marital status: Married    Spouse name: Not on file  .  Number of children: Not on file  . Years of education: Not on file  . Highest education level: Not on file  Social Needs  . Financial resource strain: Not on file  . Food insecurity - worry: Not on file  . Food insecurity - inability: Not on file  . Transportation needs - medical: Not on file  . Transportation needs - non-medical: Not on file  Occupational History  . Not on file  Tobacco Use  . Smoking status: Former Smoker    Years: 20.00    Types: Cigarettes    Last attempt to quit: 12/03/1999    Years since quitting: 18.1  . Smokeless tobacco: Never Used  Substance and Sexual Activity  . Alcohol use: No  . Drug use: No  . Sexual activity: Yes  Other Topics Concern  . Not on file  Social History Narrative  . Not on file    Family History  Problem Relation Age of Onset  . Breast cancer Cousin 37  . Cancer - Colon Neg Hx   . Prostate cancer Neg Hx      Current Outpatient Medications:  .  amitriptyline (ELAVIL) 100 MG tablet, Take 50 mg by mouth at bedtime as needed for sleep. , Disp: ,  Rfl:  .  apixaban (ELIQUIS) 5 MG TABS tablet, Take 1 tablet 2 (two) times daily by mouth., Disp: , Rfl:  .  Baclofen 5 MG TABS, TAKE 1 TABLET BY MOUTH THREE TIMES DAILY FOR HICCUPS, Disp: 270 tablet, Rfl: 0 .  Cholecalciferol (VITAMIN D3) 5000 units TABS, Take 5,000 Units by mouth daily. , Disp: , Rfl:  .  citalopram (CELEXA) 40 MG tablet, Take 40 mg by mouth every morning. , Disp: , Rfl:  .  diltiazem (CARDIZEM CD) 240 MG 24 hr capsule, Take 1 capsule daily by mouth., Disp: , Rfl:  .  diphenoxylate-atropine (LOMOTIL) 2.5-0.025 MG tablet, Take 1 tablet by mouth 4 (four) times daily as needed for diarrhea or loose stools., Disp: 60 tablet, Rfl: 0 .  esomeprazole (NEXIUM) 40 MG capsule, Take 40 mg by mouth daily as needed. , Disp: , Rfl:  .  glyBURIDE-metformin (GLUCOVANCE) 5-500 MG tablet, Take 0.5 tablets by mouth See admin instructions. Takes 0.5 tablet twice daily in morning and bedtime and  takes a 3rd dose only if needed for elevated blood sugar, Disp: , Rfl:  .  HYDROcodone-acetaminophen (NORCO) 7.5-325 MG tablet, Take 1 tablet by mouth every 6 (six) hours as needed for moderate pain. , Disp: , Rfl:  .  Insulin Glargine (BASAGLAR KWIKPEN) 100 UNIT/ML SOPN, Inject 26 Units 2 (two) times daily into the skin. , Disp: , Rfl:  .  KRILL OIL PO, Take 1 capsule by mouth daily. , Disp: , Rfl:  .  lidocaine-prilocaine (EMLA) cream, Apply to affected area once, Disp: 30 g, Rfl: 3 .  LORazepam (ATIVAN) 0.5 MG tablet, Take 1 tablet (0.5 mg total) by mouth every 6 (six) hours as needed (Nausea or vomiting). (Patient not taking: Reported on 01/07/2018), Disp: 30 tablet, Rfl: 0 .  magic mouthwash SOLN, Take 5 mLs by mouth 4 (four) times daily as needed for mouth pain. (Patient not taking: Reported on 01/07/2018), Disp: 420 mL, Rfl: 0 .  MORPHINE SULFATE IJ, Inject as directed. Morphine 4mg /ml epidural pump. 1.4897 mg/day, Disp: , Rfl:  .  niacin 500 MG tablet, Take 500 mg by mouth at bedtime., Disp: , Rfl:  .  ondansetron (ZOFRAN) 8 MG tablet, Take 1 tablet (8 mg total) by mouth 2 (two) times daily as needed. Start on the third day after chemotherapy. (Patient not taking: Reported on 01/07/2018), Disp: 30 tablet, Rfl: 1 .  oxymetazoline (AFRIN) 0.05 % nasal spray, Place 1 spray into both nostrils 2 (two) times daily as needed for congestion., Disp: , Rfl:  .  pregabalin (LYRICA) 150 MG capsule, Take 150 mg by mouth 2 (two) times daily., Disp: , Rfl:  .  prochlorperazine (COMPAZINE) 10 MG tablet, Take 1 tablet (10 mg total) by mouth every 6 (six) hours as needed (Nausea or vomiting). (Patient not taking: Reported on 01/07/2018), Disp: 30 tablet, Rfl: 1 .  silver sulfADIAZINE (SILVADENE) 1 % cream, Apply 1 application topically 2 (two) times daily. (Patient not taking: Reported on 01/07/2018), Disp: 50 g, Rfl: 0 .  sucralfate (CARAFATE) 1 g tablet, Take 1 tablet (1 g total) 3 (three) times daily by mouth. Dissolve  in 2-3 tbsp warm water, swish and swallow. (Patient not taking: Reported on 01/07/2018), Disp: 90 tablet, Rfl: 3 .  thyroid (ARMOUR) 32.5 MG tablet, Take 32.5 mg by mouth every morning. , Disp: , Rfl:  .  tiZANidine (ZANAFLEX) 4 MG tablet, Take 4 mg by mouth every 8 (eight) hours as needed for muscle spasms., Disp: ,  Rfl:  .  vitamin E (VITAMIN E) 400 UNIT capsule, Take 400 Units by mouth daily., Disp: , Rfl:  .  zinc gluconate 50 MG tablet, Take 50 mg by mouth daily., Disp: , Rfl:  No current facility-administered medications for this visit.   Facility-Administered Medications Ordered in Other Visits:  .  0.9 %  sodium chloride infusion, , Intravenous, Continuous, Burns, Wandra Feinstein, NP, Stopped at 11/11/17 1336 .  heparin lock flush 100 unit/mL, 500 Units, Intravenous, Once, Sindy Guadeloupe, MD  Physical exam:  Vitals:   01/23/18 1336 01/23/18 1343  BP:  126/87  Pulse:  92  Resp: 12   Temp:  (!) 97.1 F (36.2 C)  TempSrc:  Tympanic  Weight: 213 lb (96.6 kg)   Height: 5' 9.5" (1.765 m)    Physical Exam  Constitutional: He is oriented to person, place, and time and well-developed, well-nourished, and in no distress.  HENT:  Head: Normocephalic and atraumatic.  Mouth/Throat: Oropharynx is clear and moist.  Eyes: EOM are normal. Pupils are equal, round, and reactive to light.  Neck: Normal range of motion.  Cardiovascular: Normal rate, regular rhythm and normal heart sounds.  Pulmonary/Chest: Effort normal and breath sounds normal.  Abdominal: Soft. Bowel sounds are normal.  Lymphadenopathy:  No palpable cervical adenopathy  Neurological: He is alert and oriented to person, place, and time.  Skin: Skin is warm and dry.     CMP Latest Ref Rng & Units 01/23/2018  Glucose 65 - 99 mg/dL 208(H)  BUN 6 - 20 mg/dL 17  Creatinine 0.61 - 1.24 mg/dL 0.93  Sodium 135 - 145 mmol/L 133(L)  Potassium 3.5 - 5.1 mmol/L 4.1  Chloride 101 - 111 mmol/L 100(L)  CO2 22 - 32 mmol/L 25  Calcium 8.9  - 10.3 mg/dL 9.5  Total Protein 6.5 - 8.1 g/dL 7.7  Total Bilirubin 0.3 - 1.2 mg/dL 0.8  Alkaline Phos 38 - 126 U/L 66  AST 15 - 41 U/L 20  ALT 17 - 63 U/L 19   CBC Latest Ref Rng & Units 01/23/2018  WBC 3.8 - 10.6 K/uL 6.9  Hemoglobin 13.0 - 18.0 g/dL 13.5  Hematocrit 40.0 - 52.0 % 39.7(L)  Platelets 150 - 440 K/uL 262    No images are attached to the encounter.  Nm Pet Image Restag (ps) Skull Base To Thigh  Result Date: 01/20/2018 CLINICAL DATA:  Subsequent treatment strategy for oropharyngeal cancer, prior systemic therapy/radiotherapy. EXAM: NUCLEAR MEDICINE PET SKULL BASE TO THIGH TECHNIQUE: 12.4 mCi F-18 FDG was injected intravenously. Full-ring PET imaging was performed from the skull base to thigh after the radiotracer. CT data was obtained and used for attenuation correction and anatomic localization. FASTING BLOOD GLUCOSE:  Value: 172 mg/dl COMPARISON:  Prior PET-CT dated 09/10/2017 FINDINGS: NECK The previous hypermetabolic activity along the right palatine tonsil has resolved. This previously had a maximum SUV of 8.5 and currently the maximum SUV in the analogous region is 2.9, and symmetric to the contralateral side. A right level IIa lymph node measures 0.8 cm in short axis on image 41/4 (formerly 1.6 cm) with maximum SUV of nodal activity in this vicinity 2.3 (formerly 4.9). The adjacent right level IIa lymph node is similarly reduced in size and activity. No new or hypermetabolic nodal activity in the neck. CHEST No hypermetabolic mediastinal or hilar nodes. No suspicious pulmonary nodules on the CT data. Scarring anteriorly in the left upper lobe, stable. Left Port-A-Cath tip: Cavoatrial junction. Atherosclerotic calcification of the left  anterior descending and right coronary arteries along with the aortic arch. ABDOMEN/PELVIS No abnormal hypermetabolic activity within the liver, pancreas, adrenal glands, or spleen. No hypermetabolic lymph nodes in the abdomen or pelvis.  Physiologic activity in loops of bowel specially in the right upper quadrant. Prior cholecystectomy. Left kidney upper pole exophytic 3.7 cm lesion measures 14 Hounsfield units in density but is photopenic, likely a mildly complex cyst. Peripancreatic lymph node 0.9 cm in short axis, previously 1.1 cm, not hypermetabolic. Aortoiliac atherosclerotic vascular disease. Prior right hemicolectomy. SKELETON No focal hypermetabolic activity to suggest skeletal metastasis. Note is again made of a pain pump with suspected tubing fragment in the sacral thecal sac. IMPRESSION: 1. Complete resolution of the abnormal activity in the right palatine tonsil. Resolution of the prior hypermetabolic activity in the right level IIa lymph nodes, which are notably reduced in size. No current abnormal hypermetabolic activity to suggest active malignancy in the neck, chest, abdomen, or pelvis. 2. Other imaging findings of potential clinical significance: Aortic Atherosclerosis (ICD10-I70.0). Coronary atherosclerosis. Prior right hemicolectomy. Stable photopenic left kidney upper pole hypodense lesion, likely a cyst. Stable appearance of pain pump with suspected discontinuous tubing fragment in the sacral thecal sac. Electronically Signed   By: Van Clines M.D.   On: 01/20/2018 13:37     Assessment and plan- Patient is a 62 y.o. male with SCC of oropharynx Stage I T2N1M0status post 7 cycles of weekly cisplatin and radiation   I have personally reviewed PET/CT scan images independently and discussed the findings with the patient.  Hypermetabolic activity in the right palatine tonsil has resolved.  Similarly no hypermetabolic them seen at the to a cervical lymph node levels as well.  No evidence of metastatic disease.  He has had an excellent treatment response to chemotherapy and radiation so far.   Per NCCN guidelines he would need routine physical exams every 1-3 3 months in the first year including a complete head and  neck exam and fiberoptic exam.  He will continue to follow-up with myself radiation oncology as well as ENT at this time.  I will check his TSH every 6 months  Chemo-induced anemia: Hemoglobin now significantly improved to 13.5.  I will see him back in 3 months with CBC CMP.  We can take his port out at this time   Visit Diagnosis 1. Oropharyngeal cancer ([place])      Dr. Randa Evens, MD, MPH [facility] at [facility] Pager- [phone] 01/23/2018 1:14 PM

## 2018-01-29 ENCOUNTER — Inpatient Hospital Stay: Payer: Federal, State, Local not specified - PPO

## 2018-02-03 ENCOUNTER — Other Ambulatory Visit: Payer: Self-pay | Admitting: *Deleted

## 2018-02-04 ENCOUNTER — Other Ambulatory Visit: Payer: Self-pay | Admitting: *Deleted

## 2018-02-04 DIAGNOSIS — C109 Malignant neoplasm of oropharynx, unspecified: Secondary | ICD-10-CM

## 2018-02-06 ENCOUNTER — Other Ambulatory Visit: Payer: Self-pay | Admitting: *Deleted

## 2018-02-06 ENCOUNTER — Telehealth: Payer: Self-pay | Admitting: *Deleted

## 2018-02-06 NOTE — Telephone Encounter (Signed)
Spoke with patient via telephone and informed him of the appointment for port removal next Wednesday, March 13th at 2:30. Patient was instructed to eat a light breakfast that morning and take his meds, but nothing to eat or drink after 8:30 am. He is to hold his Eloquis after taking his morning dose on 3/11.He [facility] medical mall [facility] 3[facility]13 at 2:30 for a 3:30 procedure. Patient verbalized understanding.           dhs

## 2018-02-06 NOTE — Telephone Encounter (Signed)
Called cardiology and spoke with Betsi to see if pt can come off eliquis for 2 days in order to get portacath removed.  They will check with md and send me a letter and I gave [place] fax number. Also called ptand left message stating  that I am currently working on getting appt to get port out. I have called [place] office to get permission 2 days off eliquis to get port removed. Will call him with update after I hear back from [place] office.

## 2018-02-10 ENCOUNTER — Other Ambulatory Visit: Payer: Self-pay | Admitting: Student

## 2018-02-11 ENCOUNTER — Ambulatory Visit
Admission: RE | Admit: 2018-02-11 | Discharge: 2018-02-11 | Disposition: A | Discharge status: Home / Self Care | Payer: Federal, State, Local not specified - PPO | Source: Ambulatory Visit | Attending: Oncology | Admitting: Oncology

## 2018-02-11 DIAGNOSIS — Z85818 Personal history of malignant neoplasm of other sites of lip, oral cavity, and pharynx: Secondary | ICD-10-CM | POA: Diagnosis not present

## 2018-02-11 DIAGNOSIS — Z452 Encounter for adjustment and management of vascular access device: Secondary | ICD-10-CM | POA: Diagnosis present

## 2018-02-11 DIAGNOSIS — C109 Malignant neoplasm of oropharynx, unspecified: Secondary | ICD-10-CM

## 2018-02-11 HISTORY — PX: IR REMOVAL TUN ACCESS W/ PORT W/O FL MOD SED: IMG2290

## 2018-02-11 LAB — CBC
HCT: 43.7 % (ref 40.0–52.0)
Hemoglobin: 14.9 g/dL (ref 13.0–18.0)
MCH: 30.3 pg (ref 26.0–34.0)
MCHC: 34 g/dL (ref 32.0–36.0)
MCV: 89.3 fL (ref 80.0–100.0)
Platelets: 225 10*3/uL (ref 150–440)
RBC: 4.9 MIL/uL (ref 4.40–5.90)
RDW: 13.8 % (ref 11.5–14.5)
WBC: 8.8 10*3/uL (ref 3.8–10.6)

## 2018-02-11 LAB — PROTIME-INR
INR: 0.89
Prothrombin Time: 12 seconds (ref 11.4–15.2)

## 2018-02-11 LAB — APTT: aPTT: 29 seconds (ref 24–36)

## 2018-02-11 MED ORDER — VANCOMYCIN HCL IN DEXTROSE 1-5 GM/200ML-% IV SOLN
1000.0000 mg | INTRAVENOUS | Status: AC
  Administered 2018-02-11: 1000 mg via INTRAVENOUS
  Filled 2018-02-11 (×2): qty 200

## 2018-02-11 MED ORDER — LIDOCAINE-EPINEPHRINE (PF) 1 %-1:200000 IJ SOLN
30.0000 mL | Freq: Once | INTRAMUSCULAR | Status: DC
  Filled 2018-02-11: qty 30

## 2018-02-11 MED ORDER — SODIUM CHLORIDE 0.9 % IV SOLN
INTRAVENOUS | Status: DC
  Administered 2018-02-11: 1000 mL via INTRAVENOUS

## 2018-02-11 MED ORDER — MIDAZOLAM HCL 5 MG/5ML IJ SOLN
INTRAMUSCULAR | Status: AC
  Filled 2018-02-11: qty 5

## 2018-02-11 MED ORDER — LIDOCAINE-EPINEPHRINE (PF) 1 %-1:200000 IJ SOLN
INTRAMUSCULAR | Status: AC
  Filled 2018-02-11: qty 30

## 2018-02-11 MED ORDER — FENTANYL CITRATE (PF) 100 MCG/2ML IJ SOLN
INTRAMUSCULAR | Status: AC
  Filled 2018-02-11: qty 4

## 2018-02-11 NOTE — Procedures (Signed)
  Procedure: L IJ Port removal   EBL:   minimal Complications:  none immediate  See full dictation in [facility].  Dillard Cannon MD Main # [phone] Pager  [phone]

## 2018-03-06 ENCOUNTER — Inpatient Hospital Stay: Payer: Federal, State, Local not specified - PPO | Attending: Oncology

## 2018-04-06 ENCOUNTER — Other Ambulatory Visit: Payer: Self-pay | Admitting: Oncology

## 2018-04-06 ENCOUNTER — Inpatient Hospital Stay: Payer: Federal, State, Local not specified - PPO | Attending: Oncology

## 2018-04-06 ENCOUNTER — Inpatient Hospital Stay (HOSPITAL_BASED_OUTPATIENT_CLINIC_OR_DEPARTMENT_OTHER): Payer: Federal, State, Local not specified - PPO | Admitting: Oncology

## 2018-04-06 ENCOUNTER — Encounter: Payer: Self-pay | Admitting: Oncology

## 2018-04-06 VITALS — BP 139/94 | HR 82 | Temp 98.3°F | Resp 18 | Ht 69.5 in | Wt 212.2 lb

## 2018-04-06 DIAGNOSIS — E119 Type 2 diabetes mellitus without complications: Secondary | ICD-10-CM

## 2018-04-06 DIAGNOSIS — G8929 Other chronic pain: Secondary | ICD-10-CM

## 2018-04-06 DIAGNOSIS — I10 Essential (primary) hypertension: Secondary | ICD-10-CM

## 2018-04-06 DIAGNOSIS — F418 Other specified anxiety disorders: Secondary | ICD-10-CM | POA: Diagnosis not present

## 2018-04-06 DIAGNOSIS — M545 Low back pain: Secondary | ICD-10-CM | POA: Insufficient documentation

## 2018-04-06 DIAGNOSIS — E039 Hypothyroidism, unspecified: Secondary | ICD-10-CM

## 2018-04-06 DIAGNOSIS — K117 Disturbances of salivary secretion: Secondary | ICD-10-CM | POA: Diagnosis not present

## 2018-04-06 DIAGNOSIS — Z87891 Personal history of nicotine dependence: Secondary | ICD-10-CM | POA: Insufficient documentation

## 2018-04-06 DIAGNOSIS — C109 Malignant neoplasm of oropharynx, unspecified: Secondary | ICD-10-CM

## 2018-04-06 DIAGNOSIS — Z85819 Personal history of malignant neoplasm of unspecified site of lip, oral cavity, and pharynx: Secondary | ICD-10-CM | POA: Diagnosis not present

## 2018-04-06 DIAGNOSIS — I4891 Unspecified atrial fibrillation: Secondary | ICD-10-CM | POA: Insufficient documentation

## 2018-04-06 DIAGNOSIS — Z86010 Personal history of colonic polyps: Secondary | ICD-10-CM

## 2018-04-06 DIAGNOSIS — Z800 Family history of malignant neoplasm of digestive organs: Secondary | ICD-10-CM | POA: Diagnosis not present

## 2018-04-06 DIAGNOSIS — Z79899 Other long term (current) drug therapy: Secondary | ICD-10-CM | POA: Diagnosis not present

## 2018-04-06 DIAGNOSIS — R682 Dry mouth, unspecified: Secondary | ICD-10-CM

## 2018-04-06 LAB — CBC WITH DIFFERENTIAL/PLATELET
Basophils Absolute: 0.1 10*3/uL (ref 0–0.1)
Basophils Relative: 1 %
Eosinophils Absolute: 0.2 10*3/uL (ref 0–0.7)
Eosinophils Relative: 2 %
HCT: 46 % (ref 40.0–52.0)
Hemoglobin: 15.5 g/dL (ref 13.0–18.0)
Lymphocytes Relative: 14 %
Lymphs Abs: 1.3 10*3/uL (ref 1.0–3.6)
MCH: 28.2 pg (ref 26.0–34.0)
MCHC: 33.8 g/dL (ref 32.0–36.0)
MCV: 83.5 fL (ref 80.0–100.0)
Monocytes Absolute: 0.6 10*3/uL (ref 0.2–1.0)
Monocytes Relative: 7 %
Neutro Abs: 7.1 10*3/uL — ABNORMAL HIGH (ref 1.4–6.5)
Neutrophils Relative %: 76 %
Platelets: 229 10*3/uL (ref 150–440)
RBC: 5.51 MIL/uL (ref 4.40–5.90)
RDW: 13.8 % (ref 11.5–14.5)
WBC: 9.2 10*3/uL (ref 3.8–10.6)

## 2018-04-06 LAB — COMPREHENSIVE METABOLIC PANEL
ALT: 17 U/L (ref 17–63)
AST: 18 U/L (ref 15–41)
Albumin: 4.8 g/dL (ref 3.5–5.0)
Alkaline Phosphatase: 78 U/L (ref 38–126)
Anion gap: 10 (ref 5–15)
BUN: 23 mg/dL — ABNORMAL HIGH (ref 6–20)
CO2: 23 mmol/L (ref 22–32)
Calcium: 9.5 mg/dL (ref 8.9–10.3)
Chloride: 101 mmol/L (ref 101–111)
Creatinine, Ser: 1.06 mg/dL (ref 0.61–1.24)
GFR calc Af Amer: >60 mL/min (ref >60)
GFR calc non Af Amer: >60 mL/min (ref >60)
Glucose, Bld: 235 mg/dL — ABNORMAL HIGH (ref 65–99)
Potassium: 4.1 mmol/L (ref 3.5–5.1)
Sodium: 134 mmol/L — ABNORMAL LOW (ref 135–145)
Total Bilirubin: 0.8 mg/dL (ref 0.3–1.2)
Total Protein: 8.1 g/dL (ref 6.5–8.1)

## 2018-04-06 MED ORDER — PROCHLORPERAZINE MALEATE 10 MG PO TABS: 10.0000 mg | ORAL_TABLET | Freq: Four times a day (QID) | ORAL | 1 refills | Status: DC | PRN

## 2018-04-06 NOTE — Progress Notes (Signed)
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 Patient is a 62 year old male who was found to have a right neck mass about 1 year ago when he underwent morphine pump placement. At that time he was seen by ENT and also went through antibiotic course but was not found to have a malignancy back then. Patient recently underwent a hemicolectomy by Dr. Jamal Collin as he was found to have a colonic polyp which was not resectable on colonoscopy. Shortly following the surgery patient again noticed right sided neck mass. He wasttreated with empiric antibiotics by Dr. Doy Hutching but the mass did not go away and he was referred to Dr. Tami Ribas. Patient was found to have tonsillar asymmetry on NPL exam and underwent CT neck  2. CT soft tissue of the neck. Multiple right-sided lymph nodes at level IIA and 2B between 1.5-1.8 cm in size which were suspicious in appearance.also noted to have started asymmetric soft tissue fullness in the oropharynx or on the right lateral wall in the right palatine tonsil region measuring up to 2.7 cm.  3. 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 This was followed by a PET CT scan which showed right palatine tonsillar mass with a maximum SUV of 8.5 compatible with malignancy. Several right level IIA lymph nodes are  present and are hypermetabolic, larger node measuring 1.6 cm in short axis. No evidence of metastatic spread to the chest abdomen and pelvis or visualized skeleton  5. Patient has been referred to [place] for further management. Currently patient reports some pain and discomfort in his right neck. Denies any difficulty swallowing. His appetite is good and he does not report any unintentional weight loss. He does have chronic pain for which he has a morphine pump in place  6. Cycle 7 of weekly cisplatin completed on 11/18/17    Interval history- reports dry mouth which makes it difficult for him to swallow. He has tried drinking more water, lemon drops but they dont seem to help. Reports early morning nausea due to dry mouth. Denies other complaints  ECOG PS- 0 Pain scale- 3- low back pain Opioid associated constipation. no  Review of systems- Review of Systems  Constitutional: Negative for chills, fever, malaise/fatigue and weight loss.  HENT: Negative for congestion, ear discharge and nosebleeds.        Dry mouth  Eyes: Negative for blurred vision.  Respiratory: Negative for cough, hemoptysis, sputum production, shortness of breath and wheezing.   Cardiovascular: Negative for chest pain, palpitations, orthopnea and claudication.  Gastrointestinal: Negative for abdominal pain, blood in stool, constipation, diarrhea, heartburn, melena, nausea and vomiting.  Genitourinary: Negative for dysuria, flank pain, frequency, hematuria and urgency.  Musculoskeletal: Positive for back pain. Negative for joint pain and myalgias.  Skin: Negative for rash.  Neurological: Negative for dizziness, tingling, focal weakness, seizures, weakness and headaches.  Endo/Heme/Allergies: Does not bruise/bleed easily.  Psychiatric/Behavioral: Negative for depression  and suicidal ideas. The patient does not have insomnia.       Allergies  Allergen Reactions  . Biaxin [Clarithromycin] Other (See Comments)    Severe  hiccups  . Ceftin [Cefuroxime Axetil] Other (See Comments)    Severe hiccups  . Penicillins Hives    Has patient had a PCN reaction causing immediate rash, facial/tongue/throat swelling, SOB or lightheadedness with hypotension: No Has patient had a PCN reaction causing severe rash involving mucus membranes or skin necrosis: unknown Has patient had a PCN reaction that required hospitalization: No Has patient had a PCN reaction occurring within the last 10 years: No If all of the above answers are "NO", then may proceed with Cephalosporin use.      Past Medical History:  Diagnosis Date  . Anxiety   . Arthritis   . Cancer ([place]) 08/21/2017  . Chronic pain 1996   due to work injury  . Colon polyp    adenomatous polyp ascending colon  . Cough    THAT WONT GO AWAY-PT TO SEE DR Doy Hutching ON 06-10-17 @ 4 PM  . Depression   . Diabetes mellitus without complication ([place])   . Dysrhythmia    AFIB X 1  . GERD (gastroesophageal reflux disease)   . Herniated lumbar intervertebral disc   . Hypertension   . Hypothyroidism   . Neck pain   . Neuromuscular disorder ([place])   . Presence of intrathecal pump    Dr. Mechele Dawley in [place], [place]     Past Surgical History:  Procedure Laterality Date  . COLONOSCOPY  2013   [place]  . COLONOSCOPY WITH PROPOFOL N/A 04/22/2017   Procedure: COLONOSCOPY WITH PROPOFOL;  Surgeon: Lollie Sails, MD;  Location: [facility] ENDOSCOPY;  Service: Endoscopy;  Laterality: N/A;  . IMAGE GUIDED SINUS SURGERY    . implanted morphine pump  2016  . IR FLUORO GUIDE PORT INSERTION LEFT  09/26/2017  . IR REMOVAL TUN ACCESS W/ PORT W/O FL MOD SED  02/11/2018  . LAPAROSCOPIC CHOLECYSTECTOMY  2013  . PARTIAL COLECTOMY Right 06/17/2017   Procedure: PARTIAL COLECTOMY;  Surgeon: Christene Lye, MD;  Location: [facility] ORS;  Service: General;  Laterality: Right;  . UMBILICAL HERNIA REPAIR  2013  . UMBILICAL HERNIA REPAIR  06/17/2017   Procedure: HERNIA REPAIR  UMBILICAL ADULT;  Surgeon: Christene Lye, MD;  Location: [facility] ORS;  Service: General;;  . VASECTOMY      Social History   Socioeconomic History  . Marital status: Married    Spouse name: Not on file  . Number of children: Not on file  . Years of education: Not on file  . Highest education level: Not on file  Occupational History  . Not on file  Social Needs  . Financial resource strain: Not on file  . Food insecurity:    Worry: Not on file    Inability: Not on file  . Transportation needs:    Medical: Not on file    Non-medical: Not on file  Tobacco Use  . Smoking status: Former Smoker    Years: 20.00    Types: Cigarettes    Last attempt to quit: 12/03/1999    Years since quitting: 18.3  . Smokeless tobacco: Never Used  Substance and Sexual Activity  . Alcohol use: No  . Drug use: No  . Sexual activity: Yes  Lifestyle  . Physical activity:    Days per week: Not on file    Minutes per session: Not  on file  . Stress: Not on file  Relationships  . Social connections:    Talks on phone: Not on file    Gets together: Not on file    Attends religious service: Not on file    Active member of club or organization: Not on file    Attends meetings of clubs or organizations: Not on file    Relationship status: Not on file  . Intimate partner violence:    Fear of current or ex partner: Not on file    Emotionally abused: Not on file    Physically abused: Not on file    Forced sexual activity: Not on file  Other Topics Concern  . Not on file  Social History Narrative  . Not on file    Family History  Problem Relation Age of Onset  . Breast cancer Cousin 80  . Cancer - Colon Neg Hx   . Prostate cancer Neg Hx      Current Outpatient Medications:  .  amitriptyline (ELAVIL) 100 MG tablet, Take 50 mg by mouth at bedtime as needed for sleep. , Disp: , Rfl:  .  apixaban (ELIQUIS) 5 MG TABS tablet, Take 1 tablet 2 (two) times daily by mouth., Disp: , Rfl:  .   Baclofen 5 MG TABS, TAKE 1 TABLET BY MOUTH THREE TIMES DAILY FOR HICCUPS, Disp: 270 tablet, Rfl: 0 .  Cholecalciferol (VITAMIN D3) 5000 units TABS, Take 5,000 Units by mouth daily. , Disp: , Rfl:  .  citalopram (CELEXA) 40 MG tablet, Take 40 mg by mouth every morning. , Disp: , Rfl:  .  diltiazem (CARDIZEM CD) 240 MG 24 hr capsule, Take 1 capsule daily by mouth., Disp: , Rfl:  .  diphenoxylate-atropine (LOMOTIL) 2.5-0.025 MG tablet, Take 1 tablet by mouth 4 (four) times daily as needed for diarrhea or loose stools., Disp: 60 tablet, Rfl: 0 .  esomeprazole (NEXIUM) 40 MG capsule, Take 40 mg by mouth daily as needed. , Disp: , Rfl:  .  glyBURIDE-metformin (GLUCOVANCE) 5-500 MG tablet, Take 0.5 tablets by mouth See admin instructions. Takes 0.5 tablet twice daily in morning and bedtime and takes a 3rd dose only if needed for elevated blood sugar, Disp: , Rfl:  .  HYDROcodone-acetaminophen (NORCO) 7.5-325 MG tablet, Take 1 tablet by mouth every 6 (six) hours as needed for moderate pain. , Disp: , Rfl:  .  Insulin Glargine (BASAGLAR KWIKPEN) 100 UNIT/ML SOPN, Inject 26 Units 2 (two) times daily into the skin. , Disp: , Rfl:  .  KRILL OIL PO, Take 1 capsule by mouth daily. , Disp: , Rfl:  .  lidocaine-prilocaine (EMLA) cream, Apply to affected area once, Disp: 30 g, Rfl: 3 .  LORazepam (ATIVAN) 0.5 MG tablet, Take 1 tablet (0.5 mg total) by mouth every 6 (six) hours as needed (Nausea or vomiting)., Disp: 30 tablet, Rfl: 0 .  magic mouthwash SOLN, Take 5 mLs by mouth 4 (four) times daily as needed for mouth pain., Disp: 420 mL, Rfl: 0 .  MORPHINE SULFATE IJ, Inject as directed. Morphine 4mg /ml epidural pump. 1.4897 mg/day, Disp: , Rfl:  .  niacin 500 MG tablet, Take 500 mg by mouth at bedtime., Disp: , Rfl:  .  ondansetron (ZOFRAN) 8 MG tablet, Take 1 tablet (8 mg total) by mouth 2 (two) times daily as needed. Start on the third day after chemotherapy., Disp: 30 tablet, Rfl: 1 .  oxymetazoline (AFRIN) 0.05  % nasal spray, Place 1 spray into both  nostrils 2 (two) times daily as needed for congestion., Disp: , Rfl:  .  pregabalin (LYRICA) 150 MG capsule, Take 150 mg by mouth 2 (two) times daily., Disp: , Rfl:  .  prochlorperazine (COMPAZINE) 10 MG tablet, Take 1 tablet (10 mg total) by mouth every 6 (six) hours as needed (Nausea or vomiting)., Disp: 30 tablet, Rfl: 1 .  silver sulfADIAZINE (SILVADENE) 1 % cream, Apply 1 application topically 2 (two) times daily., Disp: 50 g, Rfl: 0 .  sucralfate (CARAFATE) 1 g tablet, Take 1 tablet (1 g total) 3 (three) times daily by mouth. Dissolve in 2-3 tbsp warm water, swish and swallow., Disp: 90 tablet, Rfl: 3 .  thyroid (ARMOUR) 32.5 MG tablet, Take 32.5 mg by mouth every morning. , Disp: , Rfl:  .  tiZANidine (ZANAFLEX) 4 MG tablet, Take 4 mg by mouth every 8 (eight) hours as needed for muscle spasms., Disp: , Rfl:  .  vitamin E (VITAMIN E) 400 UNIT capsule, Take 400 Units by mouth daily., Disp: , Rfl:  .  zinc gluconate 50 MG tablet, Take 50 mg by mouth daily., Disp: , Rfl:  No current facility-administered medications for this visit.   Facility-Administered Medications Ordered in Other Visits:  .  0.9 %  sodium chloride infusion, , Intravenous, Continuous, Burns, Wandra Feinstein, NP, Stopped at 11/11/17 1336 .  heparin lock flush 100 unit/mL, 500 Units, Intravenous, Once, Sindy Guadeloupe, MD  Physical exam:  Vitals:   04/06/18 1330  BP: (!) 139/94  Pulse: 82  Resp: 18  Temp: 98.3 F (36.8 C)  TempSrc: Tympanic  SpO2: 100%  Weight: 212 lb 3.1 oz (96.2 kg)  Height: 5' 9.5" (1.765 m)   Physical Exam  Constitutional: He is oriented to person, place, and time. He appears well-developed and well-nourished.  HENT:  Head: Normocephalic and atraumatic.  Oral mucosa appears dry  Eyes: Pupils are equal, round, and reactive to light. EOM are normal.  Neck: Normal range of motion.  Cardiovascular: Normal rate, regular rhythm and normal heart sounds.    Pulmonary/Chest: Effort normal and breath sounds normal.  Abdominal: Soft. Bowel sounds are normal.  Lymphadenopathy:  No palpable cervical, supraclavicular adenopathy  Neurological: He is alert and oriented to person, place, and time.  Skin: Skin is warm and dry.     CMP Latest Ref Rng & Units 01/23/2018  Glucose 65 - 99 mg/dL 208(H)  BUN 6 - 20 mg/dL 17  Creatinine 0.61 - 1.24 mg/dL 0.93  Sodium 135 - 145 mmol/L 133(L)  Potassium 3.5 - 5.1 mmol/L 4.1  Chloride 101 - 111 mmol/L 100(L)  CO2 22 - 32 mmol/L 25  Calcium 8.9 - 10.3 mg/dL 9.5  Total Protein 6.5 - 8.1 g/dL 7.7  Total Bilirubin 0.3 - 1.2 mg/dL 0.8  Alkaline Phos 38 - 126 U/L 66  AST 15 - 41 U/L 20  ALT 17 - 63 U/L 19   CBC Latest Ref Rng & Units 02/11/2018  WBC 3.8 - 10.6 K/uL 8.8  Hemoglobin 13.0 - 18.0 g/dL 14.9  Hematocrit 40.0 - 52.0 % 43.7  Platelets 150 - 440 K/uL 225     Assessment and plan- Patient is a 62 y.o. male with SCC of oropharynx Stage I T2N1M0status post 7 cycles of weekly cisplatin and radiationhere for routine surveillance visit  Clinically patient is doing well. No evidence of recurrence on todays exam. No indication for surveillance imaging. He will continue to f/u with Dr. Con Memos and Dr. Baruch Gouty as  well. I will see him back in 6 months with cbc/cmp. He is seeing ENT in 3 months  Xerostomia- he will try biotene mouth wash along with salivamax and see if it helps. I will renew his compazine for nausea due to dry mouth     Visit Diagnosis 1. Oropharyngeal cancer ([place])   2. Xerostomia      Dr. Randa Evens, MD, MPH [facility] [phone] 04/06/2018 1:49 PM

## 2018-04-10 ENCOUNTER — Telehealth: Payer: Self-pay | Admitting: *Deleted

## 2018-04-10 NOTE — Telephone Encounter (Signed)
Called pt to let him know that the saliva med I found on  Line and as provider we can order samples so I sent for a sample and I have some and pt can come by and try samples and if he likes it we can try ordering it for him. He is coming right now to have it

## 2018-04-14 ENCOUNTER — Other Ambulatory Visit: Payer: Federal, State, Local not specified - PPO

## 2018-04-14 ENCOUNTER — Ambulatory Visit: Payer: Federal, State, Local not specified - PPO | Admitting: Oncology

## 2018-04-22 DIAGNOSIS — E782 Mixed hyperlipidemia: Secondary | ICD-10-CM | POA: Insufficient documentation

## 2018-05-04 ENCOUNTER — Encounter: Payer: Self-pay | Admitting: Radiation Oncology

## 2018-05-04 ENCOUNTER — Ambulatory Visit
Admission: RE | Admit: 2018-05-04 | Discharge: 2018-05-04 | Disposition: A | Discharge status: Home / Self Care | Payer: Federal, State, Local not specified - PPO | Source: Ambulatory Visit | Attending: Radiation Oncology | Admitting: Radiation Oncology

## 2018-05-04 ENCOUNTER — Other Ambulatory Visit: Payer: Self-pay

## 2018-05-04 VITALS — BP 138/72 | HR 94 | Temp 95.6°F | Resp 20 | Wt 204.5 lb

## 2018-05-04 DIAGNOSIS — Z85819 Personal history of malignant neoplasm of unspecified site of lip, oral cavity, and pharynx: Secondary | ICD-10-CM | POA: Insufficient documentation

## 2018-05-04 DIAGNOSIS — Z923 Personal history of irradiation: Secondary | ICD-10-CM | POA: Insufficient documentation

## 2018-05-04 DIAGNOSIS — C099 Malignant neoplasm of tonsil, unspecified: Secondary | ICD-10-CM

## 2018-05-04 NOTE — Progress Notes (Signed)
Radiation Oncology Follow up Note  Name: Stephen Yu   Date:   05/04/2018 MRN:  [id number] DOB: [birth year]    This 62 y.o. male presents to the clinic today for 5 month follow-up status post concurrent chemoradiation therapy for P 16 positive locally man squamous cell carcinoma the right tonsil.staging was IIIa (T3 N1 M0)  REFERRING PROVIDER: Idelle Crouch, MD  HPI: Stephen Yu is a 62 year old male now out 5 months having completed concurrent chemoradiation therapy for locally advanced squamous cell carcinoma the right tonsil. Seen today in routine follow-up he is doing well states his taste is returning he still has somewhat of a dry mouth. He specifically denies head and neck pain or dysphagia..he is using Biotene for some of his xerostomia.he did have a PET CT scan back in February which I I have reviewed showing no evidence of disease. COMPLICATIONS OF TREATMENT: none  FOLLOW UP COMPLIANCE: keeps appointments   PHYSICAL EXAM:  BP 138/72   Pulse 94   Temp (!) 95.6 F (35.3 C)   Resp 20   Wt 204 lb 7.6 oz (92.8 kg)   BMI 29.76 kg/m  Oral cavity is clear no oral mucosal lesions are identified tonsillar regions unremarkable. Indirect mirror examination shows upper airway clear vallecula and base of tongue within normal limits. Neck is clear without evidence of subject gastric cervical or supraclavicular adenopathy. Well-developed well-nourished patient in NAD. HEENT reveals PERLA, EOMI, discs not visualized.  Oral cavity is clear. No oral mucosal lesions are identified. Neck is clear without evidence of cervical or supraclavicular adenopathy. Lungs are clear to A&P. Cardiac examination is essentially unremarkable with regular rate and rhythm without murmur rub or thrill. Abdomen is benign with no organomegaly or masses noted. Motor sensory and DTR levels are equal and symmetric in the upper and lower extremities. Cranial nerves II through XII are grossly intact. Proprioception is  intact. No peripheral adenopathy or edema is identified. No motor or sensory levels are noted. Crude visual fields are within normal range.  RADIOLOGY RESULTS: pET CT scan reviewed and compatible with the above-stated findings  PLAN: at the present time patient continues to do well with no evidence of disease. I'm please was overall progress. I've asked to see him back in 6 months for follow-up and then will go to once your follow-up visits. He continues close follow-up care with ENT and medical oncology. Patient is to call with any concerns.  I would like to take this opportunity to thank you for allowing me to participate in the care of your patient.Noreene Filbert, MD

## 2018-07-29 ENCOUNTER — Encounter: Payer: Self-pay | Admitting: Urology

## 2018-07-29 ENCOUNTER — Ambulatory Visit: Payer: Federal, State, Local not specified - PPO | Admitting: Urology

## 2018-07-29 ENCOUNTER — Other Ambulatory Visit: Payer: Self-pay

## 2018-07-29 VITALS — BP 146/70 | HR 76 | Ht 69.5 in | Wt 210.8 lb

## 2018-07-29 DIAGNOSIS — E291 Testicular hypofunction: Secondary | ICD-10-CM

## 2018-07-30 ENCOUNTER — Encounter: Payer: Self-pay | Admitting: Urology

## 2018-07-30 ENCOUNTER — Other Ambulatory Visit: Payer: Federal, State, Local not specified - PPO

## 2018-07-30 DIAGNOSIS — E291 Testicular hypofunction: Secondary | ICD-10-CM

## 2018-07-30 NOTE — Progress Notes (Signed)
07/29/2018 7:59 AM   Stephen Yu 26-Oct-1956 [id number]  Referring provider: Idelle Crouch, MD [place] [facility] [place], [place] [address]  Chief Complaint  Patient presents with  . Hypogonadism    HPI: 62 year old male seen in consultation at the request of Dr. Doy Hutching for evaluation of hypogonadism.  He has chronic pain on chronic opiate therapy and has previously been diagnosed with hypogonadism.  Prior treatment has included bioavailable testosterone pellet implantation x2 approximately 1.5 years ago.  He noted significant improvement in his symptoms on this treatment.  He had a testosterone level in June 2019 which was significantly low at 59 ng/dL.  He complains of tiredness, fatigue and exhaustion.  He notes significant decreased libido.  He has variable erectile dysfunction.  He has a history of head and neck cancer status post radiation and chemotherapy.  He has no bothersome lower urinary tract symptoms.   PMH: Past Medical History:  Diagnosis Date  . Anxiety   . Arthritis   . Cancer ([place]) 08/21/2017  . Chronic pain 1996   due to work injury  . Colon polyp    adenomatous polyp ascending colon  . Cough    THAT WONT GO AWAY-PT TO SEE DR Doy Hutching ON 06-10-17 @ 4 PM  . Depression   . Diabetes mellitus without complication ([place])   . Dysrhythmia    AFIB X 1  . GERD (gastroesophageal reflux disease)   . Herniated lumbar intervertebral disc   . Hypertension   . Hypothyroidism   . Neck pain   . Neuromuscular disorder ([place])   . Presence of intrathecal pump    Dr. Mechele Dawley in [place], [facility]    Surgical History: Past Surgical History:  Procedure Laterality Date  . COLONOSCOPY  2013   [place]  . COLONOSCOPY WITH PROPOFOL N/A 04/22/2017   Procedure: COLONOSCOPY WITH PROPOFOL;  Surgeon: Lollie Sails, MD;  Location: [facility] ENDOSCOPY;  Service: Endoscopy;  Laterality: N/A;  . IMAGE GUIDED SINUS SURGERY    . implanted  morphine pump  2016  . IR IMAGING GUIDED PORT INSERTION  09/26/2017  . IR REMOVAL TUN ACCESS W/ PORT W/O FL MOD SED  02/11/2018  . LAPAROSCOPIC CHOLECYSTECTOMY  2013  . PARTIAL COLECTOMY Right 06/17/2017   Procedure: PARTIAL COLECTOMY;  Surgeon: Christene Lye, MD;  Location: [facility] ORS;  Service: General;  Laterality: Right;  . UMBILICAL HERNIA REPAIR  2013  . UMBILICAL HERNIA REPAIR  06/17/2017   Procedure: HERNIA REPAIR UMBILICAL ADULT;  Surgeon: Christene Lye, MD;  Location: [facility] ORS;  Service: General;;  . VASECTOMY      Home Medications:  Allergies as of 07/29/2018      Reactions   Biaxin [clarithromycin] Other (See Comments)   Severe hiccups   Ceftin [cefuroxime Axetil] Other (See Comments)   Severe hiccups   Penicillins Hives   Has patient had a PCN reaction causing immediate rash, facial/tongue/throat swelling, SOB or lightheadedness with hypotension: No Has patient had a PCN reaction causing severe rash involving mucus membranes or skin necrosis: unknown Has patient had a PCN reaction that required hospitalization: No Has patient had a PCN reaction occurring within the last 10 years: No If all of the above answers are "NO", then may proceed with Cephalosporin use.      Medication List        Accurate as of 07/29/18 11:59 PM. Always use your most recent med list.  amitriptyline 100 MG tablet Commonly known as:  ELAVIL Take 50 mg by mouth at bedtime as needed for sleep.   Baclofen 5 MG Tabs TAKE 1 TABLET BY MOUTH THREE TIMES DAILY FOR HICCUPS   BASAGLAR KWIKPEN 100 UNIT/ML Sopn Inject 26 Units 2 (two) times daily into the skin.   BD PEN NEEDLE NANO U/F 32G X 4 MM Misc Generic drug:  Insulin Pen Needle USE EVERY DAY AS NEEDED   citalopram 40 MG tablet Commonly known as:  CELEXA Take 40 mg by mouth every morning.   diltiazem 240 MG 24 hr capsule Commonly known as:  CARDIZEM CD Take 1 capsule daily by mouth.   diphenoxylate-atropine  2.5-0.025 MG tablet Commonly known as:  LOMOTIL Take 1 tablet by mouth 4 (four) times daily as needed for diarrhea or loose stools.   ELIQUIS 5 MG Tabs tablet Generic drug:  apixaban Take 1 tablet 2 (two) times daily by mouth.   esomeprazole 40 MG capsule Commonly known as:  NEXIUM Take 40 mg by mouth daily as needed.   FREESTYLE LIBRE 14 DAY SENSOR Misc USE 1 KIT UTD   glyBURIDE-metformin 5-500 MG tablet Commonly known as:  GLUCOVANCE Take 0.5 tablets by mouth See admin instructions. Takes 0.5 tablet twice daily in morning and bedtime and takes a 3rd dose only if needed for elevated blood sugar   HYDROcodone-acetaminophen 7.5-325 MG tablet Commonly known as:  NORCO Take 1 tablet by mouth every 6 (six) hours as needed for moderate pain.   KRILL OIL PO Take 1 capsule by mouth daily.   lidocaine-prilocaine cream Commonly known as:  EMLA Apply to affected area once   LORazepam 0.5 MG tablet Commonly known as:  ATIVAN Take 1 tablet (0.5 mg total) by mouth every 6 (six) hours as needed (Nausea or vomiting).   magic mouthwash Soln Take 5 mLs by mouth 4 (four) times daily as needed for mouth pain.   MORPHINE SULFATE IJ Inject as directed. Morphine 57m/ml epidural pump. 1.4897 mg/day   niacin 500 MG tablet Take 500 mg by mouth at bedtime.   ondansetron 8 MG tablet Commonly known as:  ZOFRAN Take 1 tablet (8 mg total) by mouth 2 (two) times daily as needed. Start on the third day after chemotherapy.   oxymetazoline 0.05 % nasal spray Commonly known as:  AFRIN Place 1 spray into both nostrils 2 (two) times daily as needed for congestion.   pregabalin 150 MG capsule Commonly known as:  LYRICA Take 150 mg by mouth 2 (two) times daily.   prochlorperazine 10 MG tablet Commonly known as:  COMPAZINE Take 1 tablet (10 mg total) by mouth every 6 (six) hours as needed (Nausea or vomiting).   rosuvastatin 10 MG tablet Commonly known as:  CRESTOR TK 1 T PO ONCE D   silver  sulfADIAZINE 1 % cream Commonly known as:  SILVADENE Apply 1 application topically 2 (two) times daily.   sucralfate 1 g tablet Commonly known as:  CARAFATE Take 1 tablet (1 g total) 3 (three) times daily by mouth. Dissolve in 2-3 tbsp warm water, swish and swallow.   thyroid 32.5 MG tablet Commonly known as:  ARMOUR Take 32.5 mg by mouth every morning.   tiZANidine 4 MG tablet Commonly known as:  ZANAFLEX Take 4 mg by mouth every 8 (eight) hours as needed for muscle spasms.   TraMADol HCl 150 MG Cp24 Take by mouth.   Vitamin D3 5000 units Tabs Take 5,000 Units by mouth daily.  vitamin E 400 UNIT capsule Generic drug:  vitamin E Take 400 Units by mouth daily.   zinc gluconate 50 MG tablet Take 50 mg by mouth daily.       Allergies:  Allergies  Allergen Reactions  . Biaxin [Clarithromycin] Other (See Comments)    Severe hiccups  . Ceftin [Cefuroxime Axetil] Other (See Comments)    Severe hiccups  . Penicillins Hives    Has patient had a PCN reaction causing immediate rash, facial/tongue/throat swelling, SOB or lightheadedness with hypotension: No Has patient had a PCN reaction causing severe rash involving mucus membranes or skin necrosis: unknown Has patient had a PCN reaction that required hospitalization: No Has patient had a PCN reaction occurring within the last 10 years: No If all of the above answers are "NO", then may proceed with Cephalosporin use.     Family History: Family History  Problem Relation Age of Onset  . Breast cancer Cousin 60  . Cancer - Colon Neg Hx   . Prostate cancer Neg Hx     Social History:  reports that he quit smoking about 18 years ago. His smoking use included cigarettes. He quit after 20.00 years of use. He has never used smokeless tobacco. He reports that he does not drink alcohol or use drugs.  ROS: UROLOGY Frequent Urination?: Yes Hard to postpone urination?: No Burning/pain with urination?: No Get up at night to  urinate?: Yes Leakage of urine?: No Urine stream starts and stops?: No Trouble starting stream?: No Do you have to strain to urinate?: No Blood in urine?: No Urinary tract infection?: No Sexually transmitted disease?: No Injury to kidneys or bladder?: No Painful intercourse?: No Weak stream?: No Erection problems?: No Penile pain?: No  Gastrointestinal Nausea?: No Vomiting?: No Indigestion/heartburn?: No Diarrhea?: No Constipation?: No  Constitutional Fever: No Night sweats?: No Weight loss?: No Fatigue?: No  Skin Skin rash/lesions?: No Itching?: No  Eyes Blurred vision?: No Double vision?: No  Ears/Nose/Throat Sore throat?: No Sinus problems?: No  Hematologic/Lymphatic Swollen glands?: No Easy bruising?: No  Cardiovascular Leg swelling?: No Chest pain?: No  Respiratory Cough?: No Shortness of breath?: No  Endocrine Excessive thirst?: No  Musculoskeletal Back pain?: No Joint pain?: No  Neurological Headaches?: No Dizziness?: No  Psychologic Depression?: No Anxiety?: No  Physical Exam: BP (!) 146/70 (BP Location: Left Arm, Patient Position: Sitting, Cuff Size: Large)   Pulse 76   Ht 5' 9.5" (1.765 m)   Wt 210 lb 12.8 oz (95.6 kg)   BMI 30.68 kg/m   Constitutional:  Alert and oriented, No acute distress. HEENT: [place] AT, moist mucus membranes.  Trachea midline, no masses. Cardiovascular: No clubbing, cyanosis, or edema. Respiratory: Normal respiratory effort, no increased work of breathing. GI: Abdomen is soft, nontender, nondistended, no abdominal masses GU: No CVA tenderness.  Penis circumcised without lesions.  Testes descended bilaterally.  The left testis is atrophic.  Prostate 35 g, smooth without nodules Lymph: No cervical or inguinal lymphadenopathy. Skin: No rashes, bruises or suspicious lesions. Neurologic: Grossly intact, no focal deficits, moving all 4 extremities. Psychiatric: Normal mood and affect.   Assessment & Plan:     62 year old male with significant hypogonadism and testosterone approaching castrate levels.  He will return for a morning blood draw for a repeat testosterone, LH and prolactin.  I discussed treatment options for testosterone replacement including topicals, intramuscular and subcutaneous injections as well as pellet implantation.  Potential side effects of testosterone replacement were discussed including stimulation of benign  prostatic growth with lower urinary tract symptoms; erythrocytosis; edema; gynecomastia; worsening sleep apnea; venous thromboembolism; testicular atrophy and infertility. Recent studies suggesting an increased incidence of heart attack and stroke in patients taking testosterone was discussed. He was informed there is conflicting evidence regarding the impact of testosterone therapy on cardiovascular risk. The theoretical risk of growth stimulation of an undetected prostate cancer was also discussed.  He was informed that current evidence does not provide any definitive answers regarding the risks of testosterone therapy on prostate cancer and cardiovascular disease. The need for periodic monitoring of his testosterone level, PSA, hematocrit and DRE was discussed.  He will think about delivery methods and will be contacted with his lab results.   Abbie Sons, [place] [address] [place], [place] [address] [phone]

## 2018-07-31 ENCOUNTER — Telehealth: Payer: Self-pay

## 2018-07-31 LAB — PSA: Prostate Specific Ag, Serum: 0.2 ng/mL (ref 0.0–4.0)

## 2018-07-31 LAB — PROLACTIN: Prolactin: 17.8 ng/mL — ABNORMAL HIGH (ref 4.0–15.2)

## 2018-07-31 LAB — TESTOSTERONE: Testosterone: 59 ng/dL — ABNORMAL LOW (ref 264–916)

## 2018-07-31 LAB — LUTEINIZING HORMONE: LH: 3.4 m[IU]/mL (ref 1.7–8.6)

## 2018-07-31 NOTE — Telephone Encounter (Signed)
-----   Message from Abbie Sons, MD sent at 07/31/2018 10:11 AM EDT ----- Testosterone level remains low at 59.  LH was normal.  Prolactin was minimally elevated but not significantly.  We discussed replacement options at the last office visit including Testopel, injections, topicals and xyosted.  He was going to think over these options.  Does he have a preference?

## 2018-07-31 NOTE — Telephone Encounter (Signed)
Called pt informed him of the information below. Pt gave verbal understanding. Pt states that he would like to proceed with injections. Please send in RX. Thanks

## 2018-08-02 NOTE — Telephone Encounter (Signed)
At the visit he expressed interest in [place] he interested in these injections or regular IM testosterone injections?

## 2018-08-04 MED ORDER — TESTOSTERONE ENANTHATE 75 MG/0.5ML ~~LOC~~ SOAJ: 75.0000 mg | SUBCUTANEOUS | 2 refills | Status: DC

## 2018-08-04 NOTE — Telephone Encounter (Signed)
Pt informed

## 2018-08-04 NOTE — Telephone Encounter (Signed)
Pt states he is interested in [place].

## 2018-08-04 NOTE — Telephone Encounter (Signed)
Rx was sent.  He has the card for a free 1 month supply and would have him go ahead and use.  He will also need prior authorization most likely

## 2018-08-06 NOTE — Telephone Encounter (Signed)
I received a prior authorization request from [facility] for medication, Xyosted. I called to speak with pt in regards to whether or not he has ever tried an alternative TRT before, he states he has tried pellets. Pt states he can't use the coupon given because even with that, it was about four hundred dollars. Due to lack of therapy trials, Berdine Addison will most likely be denied, I asked pt if he would be okay trying testosterone cypionate first, he agrees. Can you please send this into [facility]?  If pt tries this and it is unsuccessful, we will proceed with [facility] prior authorization.

## 2018-08-10 MED ORDER — TESTOSTERONE CYPIONATE 200 MG/ML IM SOLN: 200.0000 mg | INTRAMUSCULAR | 0 refills | Status: DC

## 2018-08-10 NOTE — Telephone Encounter (Signed)
Rx sent.  Recommend testosterone level 4-6 weeks after starting therapy.  See if he needs nursing appointment to learn injections.

## 2018-08-10 NOTE — Addendum Note (Signed)
Addended by: Abbie Sons on: 08/10/2018 12:31 PM   Modules accepted: Orders

## 2018-08-10 NOTE — Telephone Encounter (Signed)
Left detailed message for pt 

## 2018-08-11 ENCOUNTER — Telehealth: Payer: Self-pay | Admitting: Urology

## 2018-08-11 NOTE — Telephone Encounter (Signed)
I spoke with pt, informed him we have received PA for Testosterone Cypionate and it will be processed asap.

## 2018-08-11 NOTE — Telephone Encounter (Signed)
Pt stopped by office asking about PA for Rx.  He said he spoke with you and wanted to know the status.

## 2018-08-31 ENCOUNTER — Telehealth: Payer: Self-pay | Admitting: *Deleted

## 2018-08-31 NOTE — Telephone Encounter (Signed)
Wife called to say that pt feels knot on same side of cancer and wanted to know what to do . I spoke to Dr. Janese Banks and she said to call ENT and get an appt. He would need ct scan and/ or a bx.  If she does not get a appt soon to call me and I will put him on to be seen and order ct scan but if he needs bx that would be with ENT. She will call me back if she has problems.

## 2018-09-03 ENCOUNTER — Other Ambulatory Visit: Payer: Self-pay | Admitting: Unknown Physician Specialty

## 2018-09-03 DIAGNOSIS — Z85818 Personal history of malignant neoplasm of other sites of lip, oral cavity, and pharynx: Secondary | ICD-10-CM

## 2018-09-03 DIAGNOSIS — R221 Localized swelling, mass and lump, neck: Secondary | ICD-10-CM

## 2018-09-08 ENCOUNTER — Encounter
Admission: RE | Admit: 2018-09-08 | Discharge: 2018-09-08 | Disposition: A | Discharge status: Home / Self Care | Payer: Federal, State, Local not specified - PPO | Source: Ambulatory Visit | Attending: Unknown Physician Specialty | Admitting: Unknown Physician Specialty

## 2018-09-08 DIAGNOSIS — Z85818 Personal history of malignant neoplasm of other sites of lip, oral cavity, and pharynx: Secondary | ICD-10-CM | POA: Diagnosis present

## 2018-09-08 DIAGNOSIS — R221 Localized swelling, mass and lump, neck: Secondary | ICD-10-CM | POA: Insufficient documentation

## 2018-09-08 LAB — GLUCOSE, CAPILLARY: Glucose-Capillary: 126 mg/dL — ABNORMAL HIGH (ref 70–99)

## 2018-09-08 MED ORDER — FLUDEOXYGLUCOSE F - 18 (FDG) INJECTION
10.9000 | Freq: Once | INTRAVENOUS | Status: AC | PRN
  Administered 2018-09-08: 10.69 via INTRAVENOUS

## 2018-10-06 ENCOUNTER — Inpatient Hospital Stay: Payer: Federal, State, Local not specified - PPO | Attending: Oncology | Admitting: Oncology

## 2018-10-06 ENCOUNTER — Inpatient Hospital Stay: Payer: Federal, State, Local not specified - PPO

## 2018-10-06 ENCOUNTER — Encounter: Payer: Self-pay | Admitting: Oncology

## 2018-10-06 VITALS — BP 138/83 | HR 76 | Temp 97.9°F | Resp 18 | Ht 69.5 in | Wt 233.2 lb

## 2018-10-06 DIAGNOSIS — C109 Malignant neoplasm of oropharynx, unspecified: Secondary | ICD-10-CM

## 2018-10-06 DIAGNOSIS — Z87891 Personal history of nicotine dependence: Secondary | ICD-10-CM | POA: Diagnosis not present

## 2018-10-06 DIAGNOSIS — I10 Essential (primary) hypertension: Secondary | ICD-10-CM | POA: Insufficient documentation

## 2018-10-06 DIAGNOSIS — Z79899 Other long term (current) drug therapy: Secondary | ICD-10-CM | POA: Diagnosis not present

## 2018-10-06 DIAGNOSIS — Z794 Long term (current) use of insulin: Secondary | ICD-10-CM | POA: Diagnosis not present

## 2018-10-06 DIAGNOSIS — Z923 Personal history of irradiation: Secondary | ICD-10-CM | POA: Insufficient documentation

## 2018-10-06 DIAGNOSIS — I4891 Unspecified atrial fibrillation: Secondary | ICD-10-CM | POA: Diagnosis not present

## 2018-10-06 DIAGNOSIS — Z85819 Personal history of malignant neoplasm of unspecified site of lip, oral cavity, and pharynx: Secondary | ICD-10-CM

## 2018-10-06 DIAGNOSIS — Z08 Encounter for follow-up examination after completed treatment for malignant neoplasm: Secondary | ICD-10-CM

## 2018-10-06 DIAGNOSIS — Z7901 Long term (current) use of anticoagulants: Secondary | ICD-10-CM | POA: Insufficient documentation

## 2018-10-06 DIAGNOSIS — E119 Type 2 diabetes mellitus without complications: Secondary | ICD-10-CM

## 2018-10-06 LAB — COMPREHENSIVE METABOLIC PANEL
ALT: 19 U/L (ref 0–44)
AST: 21 U/L (ref 15–41)
Albumin: 4 g/dL (ref 3.5–5.0)
Alkaline Phosphatase: 65 U/L (ref 38–126)
Anion gap: 7 (ref 5–15)
BUN: 11 mg/dL (ref 8–23)
CO2: 27 mmol/L (ref 22–32)
Calcium: 9.1 mg/dL (ref 8.9–10.3)
Chloride: 100 mmol/L (ref 98–111)
Creatinine, Ser: 1.17 mg/dL (ref 0.61–1.24)
GFR calc Af Amer: >60 mL/min (ref >60)
GFR calc non Af Amer: >60 mL/min (ref >60)
Glucose, Bld: 178 mg/dL — ABNORMAL HIGH (ref 70–99)
Potassium: 4.5 mmol/L (ref 3.5–5.1)
Sodium: 134 mmol/L — ABNORMAL LOW (ref 135–145)
Total Bilirubin: 0.8 mg/dL (ref 0.3–1.2)
Total Protein: 6.6 g/dL (ref 6.5–8.1)

## 2018-10-06 LAB — CBC WITH DIFFERENTIAL/PLATELET
Abs Immature Granulocytes: 0.02 10*3/uL (ref 0.00–0.07)
Basophils Absolute: 0 10*3/uL (ref 0.0–0.1)
Basophils Relative: 1 %
Eosinophils Absolute: 0.2 10*3/uL (ref 0.0–0.5)
Eosinophils Relative: 3 %
HCT: 44.5 % (ref 39.0–52.0)
Hemoglobin: 14.2 g/dL (ref 13.0–17.0)
Immature Granulocytes: 0 %
Lymphocytes Relative: 19 %
Lymphs Abs: 1.4 10*3/uL (ref 0.7–4.0)
MCH: 27.5 pg (ref 26.0–34.0)
MCHC: 31.9 g/dL (ref 30.0–36.0)
MCV: 86.1 fL (ref 80.0–100.0)
Monocytes Absolute: 0.8 10*3/uL (ref 0.1–1.0)
Monocytes Relative: 11 %
Neutro Abs: 4.8 10*3/uL (ref 1.7–7.7)
Neutrophils Relative %: 66 %
Platelets: 203 10*3/uL (ref 150–400)
RBC: 5.17 MIL/uL (ref 4.22–5.81)
RDW: 13 % (ref 11.5–15.5)
WBC: 7.2 10*3/uL (ref 4.0–10.5)
nRBC: 0 % (ref 0.0–0.2)

## 2018-10-06 NOTE — Progress Notes (Signed)
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 Patient is a 62 year old male who was found to have a right neck mass about 1 year ago when he underwent morphine pump placement. At that time he was seen by ENT and also went through antibiotic course but was not found to have a malignancy back then. Patient recently underwent a hemicolectomy by Dr. Jamal Collin as he was found to have a colonic polyp which was not resectable on colonoscopy. Shortly following the surgery patient again noticed right sided neck mass. He wasttreated with empiric antibiotics by Dr. Doy Hutching but the mass did not go away and he was referred to Dr. Tami Ribas. Patient was found to have tonsillar asymmetry on NPL exam and underwent CT neck  2. CT soft tissue of the neck. Multiple right-sided lymph nodes at level IIA and 2B between 1.5-1.8 cm in size which were suspicious in appearance.also noted to have started asymmetric soft tissue fullness in the oropharynx or on the right lateral wall in the right palatine tonsil region measuring up to 2.7 cm.  3. 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 This was followed by a PET CT scan which showed right  palatine tonsillar mass with a maximum SUV of 8.5 compatible with malignancy. Several right level IIA lymph nodes are present and are hypermetabolic, larger node measuring 1.6 cm in short axis. No evidence of metastatic spread to the chest abdomen and pelvis or visualized skeleton  5. Patient has been referred to [place] for further management. Currently patient reports some pain and discomfort in his right neck. Denies any difficulty swallowing. His appetite is good and he does not report any unintentional weight loss. He does have chronic pain for which he has a morphine pump in place  6. Cycle 7 of weekly cisplatin completed on 11/18/17   Interval history-patient was seen by Dr. Tami Ribas about 3 months ago when he was complaining of right neck swelling which prompted a PET CT scan which was negative for malignancy.  He continues to have dry mouth making it difficult for him to swallow certain foods and artificial saliva has not been helping.  Otherwise his appetite is good and he has not had any other weight loss  ECOG PS- 1 Pain scale- 0 Opioid associated constipation- no  Review of systems- Review of Systems  HENT:       Dry mouth      Allergies  Allergen Reactions  . Biaxin [Clarithromycin] Other (See Comments)    Severe hiccups  . Ceftin [Cefuroxime Axetil] Other (See Comments)    Severe hiccups  . Penicillins Hives    Has patient had a PCN reaction causing immediate rash, facial/tongue/throat swelling, SOB or lightheadedness with hypotension: No Has patient had a PCN reaction causing severe rash involving mucus membranes or skin necrosis: unknown Has  patient had a PCN reaction that required hospitalization: No Has patient had a PCN reaction occurring within the last 10 years: No If all of the above answers are "NO", then may proceed with Cephalosporin use.      Past Medical History:  Diagnosis Date  . Anxiety   . Arthritis   . Cancer ([place]) 08/21/2017  . Chronic pain 1996    due to work injury  . Colon polyp    adenomatous polyp ascending colon  . Cough    THAT WONT GO AWAY-PT TO SEE DR Doy Hutching ON 06-10-17 @ 4 PM  . Depression   . Diabetes mellitus without complication ([place])   . Dysrhythmia    AFIB X 1  . GERD (gastroesophageal reflux disease)   . Herniated lumbar intervertebral disc   . Hypertension   . Hypothyroidism   . Neck pain   . Neuromuscular disorder ([place])   . Presence of intrathecal pump    Dr. Mechele Dawley in [place], [place]     Past Surgical History:  Procedure Laterality Date  . COLONOSCOPY  2013   [place]  . COLONOSCOPY WITH PROPOFOL N/A 04/22/2017   Procedure: COLONOSCOPY WITH PROPOFOL;  Surgeon: Lollie Sails, MD;  Location: [facility] ENDOSCOPY;  Service: Endoscopy;  Laterality: N/A;  . IMAGE GUIDED SINUS SURGERY    . implanted morphine pump  2016  . IR IMAGING GUIDED PORT INSERTION  09/26/2017  . IR REMOVAL TUN ACCESS W/ PORT W/O FL MOD SED  02/11/2018  . LAPAROSCOPIC CHOLECYSTECTOMY  2013  . PARTIAL COLECTOMY Right 06/17/2017   Procedure: PARTIAL COLECTOMY;  Surgeon: Christene Lye, MD;  Location: [facility] ORS;  Service: General;  Laterality: Right;  . UMBILICAL HERNIA REPAIR  2013  . UMBILICAL HERNIA REPAIR  06/17/2017   Procedure: HERNIA REPAIR UMBILICAL ADULT;  Surgeon: Christene Lye, MD;  Location: [facility] ORS;  Service: General;;  . VASECTOMY      Social History   Socioeconomic History  . Marital status: Married    Spouse name: Not on file  . Number of children: Not on file  . Years of education: Not on file  . Highest education level: Not on file  Occupational History  . Not on file  Social Needs  . Financial resource strain: Not on file  . Food insecurity:    Worry: Not on file    Inability: Not on file  . Transportation needs:    Medical: Not on file    Non-medical: Not on file  Tobacco Use  . Smoking status: Former Smoker    Years: 20.00    Types: Cigarettes    Last attempt to quit:  12/03/1999    Years since quitting: 18.8  . Smokeless tobacco: Never Used  Substance and Sexual Activity  . Alcohol use: No  . Drug use: No  . Sexual activity: Yes  Lifestyle  . Physical activity:    Days per week: Not on file    Minutes per session: Not on file  . Stress: Not on file  Relationships  . Social connections:    Talks on phone: Not on file    Gets together: Not on file    Attends religious service: Not on file    Active member of club or organization: Not on file    Attends meetings of clubs or organizations: Not on file    Relationship status: Not on file  . Intimate partner violence:    Fear of current or ex partner:  Not on file    Emotionally abused: Not on file    Physically abused: Not on file    Forced sexual activity: Not on file  Other Topics Concern  . Not on file  Social History Narrative  . Not on file    Family History  Problem Relation Age of Onset  . Breast cancer Cousin 82  . Cancer - Colon Neg Hx   . Prostate cancer Neg Hx      Current Outpatient Medications:  .  apixaban (ELIQUIS) 5 MG TABS tablet, Take 1 tablet 2 (two) times daily by mouth., Disp: , Rfl:  .  Baclofen 5 MG TABS, TAKE 1 TABLET BY MOUTH THREE TIMES DAILY FOR HICCUPS, Disp: 270 tablet, Rfl: 0 .  Cholecalciferol (VITAMIN D3) 5000 units TABS, Take 5,000 Units by mouth daily. , Disp: , Rfl:  .  citalopram (CELEXA) 40 MG tablet, Take 40 mg by mouth every morning. , Disp: , Rfl:  .  Continuous Blood Gluc Sensor (FREESTYLE LIBRE 14 DAY SENSOR) MISC, USE 1 KIT UTD, Disp: , Rfl: 11 .  diltiazem (CARDIZEM CD) 240 MG 24 hr capsule, Take 1 capsule daily by mouth., Disp: , Rfl:  .  esomeprazole (NEXIUM) 40 MG capsule, Take 40 mg by mouth daily as needed. , Disp: , Rfl:  .  HYDROcodone-acetaminophen (NORCO) 7.5-325 MG tablet, Take 1 tablet by mouth every 6 (six) hours as needed for moderate pain. , Disp: , Rfl:  .  Insulin Glargine (BASAGLAR KWIKPEN) 100 UNIT/ML SOPN, Inject 26 Units 2  (two) times daily into the skin. , Disp: , Rfl:  .  KRILL OIL PO, Take 1 capsule by mouth daily. , Disp: , Rfl:  .  MORPHINE SULFATE IJ, Inject as directed. Morphine 49m/ml epidural pump. 1.4897 mg/day, Disp: , Rfl:  .  niacin 500 MG tablet, Take 500 mg by mouth at bedtime., Disp: , Rfl:  .  pregabalin (LYRICA) 150 MG capsule, Take 150 mg by mouth 2 (two) times daily., Disp: , Rfl:  .  rosuvastatin (CRESTOR) 10 MG tablet, TK 1 T PO ONCE D, Disp: , Rfl: 3 .  silver sulfADIAZINE (SILVADENE) 1 % cream, Apply 1 application topically 2 (two) times daily., Disp: 50 g, Rfl: 0 .  testosterone cypionate (DEPOTESTOSTERONE CYPIONATE) 200 MG/ML injection, Inject 1 mL (200 mg total) into the muscle every 14 (fourteen) days., Disp: 10 mL, Rfl: 0 .  thyroid (ARMOUR) 32.5 MG tablet, Take 32.5 mg by mouth every morning. , Disp: , Rfl:  .  vitamin E (VITAMIN E) 400 UNIT capsule, Take 400 Units by mouth daily., Disp: , Rfl:  .  zinc gluconate 50 MG tablet, Take 50 mg by mouth daily., Disp: , Rfl:  .  amitriptyline (ELAVIL) 100 MG tablet, Take 50 mg by mouth at bedtime as needed for sleep. , Disp: , Rfl:  .  diphenoxylate-atropine (LOMOTIL) 2.5-0.025 MG tablet, Take 1 tablet by mouth 4 (four) times daily as needed for diarrhea or loose stools. (Patient not taking: Reported on 10/06/2018), Disp: 60 tablet, Rfl: 0 .  glyBURIDE-metformin (GLUCOVANCE) 5-500 MG tablet, Take 0.5 tablets by mouth See admin instructions. Takes 0.5 tablet twice daily in morning and bedtime and takes a 3rd dose only if needed for elevated blood sugar, Disp: , Rfl:  .  Insulin Pen Needle (BD PEN NEEDLE NANO U/F) 32G X 4 MM MISC, USE EVERY DAY AS NEEDED, Disp: , Rfl:  .  lidocaine-prilocaine (EMLA) cream, Apply to affected area once (Patient  not taking: Reported on 10/06/2018), Disp: 30 g, Rfl: 3 .  LORazepam (ATIVAN) 0.5 MG tablet, Take 1 tablet (0.5 mg total) by mouth every 6 (six) hours as needed (Nausea or vomiting). (Patient not taking: Reported  on 10/06/2018), Disp: 30 tablet, Rfl: 0 .  magic mouthwash SOLN, Take 5 mLs by mouth 4 (four) times daily as needed for mouth pain. (Patient not taking: Reported on 10/06/2018), Disp: 420 mL, Rfl: 0 .  ondansetron (ZOFRAN) 8 MG tablet, Take 1 tablet (8 mg total) by mouth 2 (two) times daily as needed. Start on the third day after chemotherapy. (Patient not taking: Reported on 10/06/2018), Disp: 30 tablet, Rfl: 1 .  oxymetazoline (AFRIN) 0.05 % nasal spray, Place 1 spray into both nostrils 2 (two) times daily as needed for congestion., Disp: , Rfl:  .  prochlorperazine (COMPAZINE) 10 MG tablet, Take 1 tablet (10 mg total) by mouth every 6 (six) hours as needed (Nausea or vomiting). (Patient not taking: Reported on 10/06/2018), Disp: 30 tablet, Rfl: 1 .  sucralfate (CARAFATE) 1 g tablet, Take 1 tablet (1 g total) 3 (three) times daily by mouth. Dissolve in 2-3 tbsp warm water, swish and swallow. (Patient not taking: Reported on 10/06/2018), Disp: 90 tablet, Rfl: 3 .  tiZANidine (ZANAFLEX) 4 MG tablet, Take 4 mg by mouth every 8 (eight) hours as needed for muscle spasms., Disp: , Rfl:  No current facility-administered medications for this visit.   Facility-Administered Medications Ordered in Other Visits:  .  0.9 %  sodium chloride infusion, , Intravenous, Continuous, Burns, Wandra Feinstein, NP, Stopped at 11/11/17 1336 .  heparin lock flush 100 unit/mL, 500 Units, Intravenous, Once, Sindy Guadeloupe, MD  Physical exam:  Vitals:   10/06/18 1433  BP: 138/83  Pulse: 76  Resp: 18  Temp: 97.9 F (36.6 C)  TempSrc: Tympanic  SpO2: 97%  Weight: 233 lb 3.2 oz (105.8 kg)  Height: 5' 9.5" (1.765 m)   Physical Exam  Constitutional: He is oriented to person, place, and time. He appears well-developed and well-nourished.  HENT:  Head: Normocephalic and atraumatic.  Mouth/Throat: Oropharynx is clear and moist.  Eyes: Pupils are equal, round, and reactive to light. EOM are normal.  Neck: Normal range of motion.    Cardiovascular: Normal rate, regular rhythm and normal heart sounds.  Pulmonary/Chest: Effort normal and breath sounds normal.  Abdominal: Soft. Bowel sounds are normal.  Lymphadenopathy:  No palpable cervical adenopathy.  There is angulation seen at the site of prior radiation  Neurological: He is alert and oriented to person, place, and time.  Skin: Skin is warm and dry.     CMP Latest Ref Rng & Units 10/06/2018  Glucose 70 - 99 mg/dL 178(H)  BUN 8 - 23 mg/dL 11  Creatinine 0.61 - 1.24 mg/dL 1.17  Sodium 135 - 145 mmol/L 134(L)  Potassium 3.5 - 5.1 mmol/L 4.5  Chloride 98 - 111 mmol/L 100  CO2 22 - 32 mmol/L 27  Calcium 8.9 - 10.3 mg/dL 9.1  Total Protein 6.5 - 8.1 g/dL 6.6  Total Bilirubin 0.3 - 1.2 mg/dL 0.8  Alkaline Phos 38 - 126 U/L 65  AST 15 - 41 U/L 21  ALT 0 - 44 U/L 19   CBC Latest Ref Rng & Units 10/06/2018  WBC 4.0 - 10.5 K/uL 7.2  Hemoglobin 13.0 - 17.0 g/dL 14.2  Hematocrit 39.0 - 52.0 % 44.5  Platelets 150 - 400 K/uL 203    No images are attached to  the encounter.  Nm Pet Image Restag (ps) Skull Base To Thigh  Result Date: 09/08/2018 CLINICAL DATA:  Subsequent treatment strategy for head neck carcinoma. Oropharyngeal carcinoma. EXAM: NUCLEAR MEDICINE PET SKULL BASE TO THIGH TECHNIQUE: 10.7 mCi F-18 FDG was injected intravenously. Full-ring PET imaging was performed from the skull base to thigh after the radiotracer. CT data was obtained and used for attenuation correction and anatomic localization. Fasting blood glucose: 126 mg/dl COMPARISON:  PET-CT 01/20/2018 FINDINGS: Mediastinal blood pool activity: SUV max 1.75 NECK: No hypermetabolic lymph nodes in the neck. No hypermetabolic activity in the oropharynx or hypopharynx. Incidental CT findings: none CHEST: No hypermetabolic mediastinal or hilar nodes. No suspicious pulmonary nodules on the CT scan. Incidental CT findings: none ABDOMEN/PELVIS: No abnormal hypermetabolic activity within the liver, pancreas,  adrenal glands, or spleen. No hypermetabolic lymph nodes in the abdomen or pelvis. Incidental CT findings: Wide-mouth midline ventral hernia measures 3 cm. Generator pack LEFT abdominal wall with leads extending into the central canal of the lumbar spine. SKELETON: No focal hypermetabolic activity to suggest skeletal metastasis. Incidental CT findings: none IMPRESSION: 1. No evidence of oropharyngeal carcinoma recurrence. 2. No local metastatic adenopathy. 3. No distant metastatic disease. Electronically Signed   By: Suzy Bouchard M.D.   On: 09/08/2018 13:05     Assessment and plan- Patient is a 62 y.o. male  with SCC of oropharynx Stage I T2N1M0status post 7 cycles of weekly cisplatin and radiation  Patient had a recent PET CT scan last month for possible swelling in his right leg which is likely scarring from prior radiation.  PET CT scan showed no evidence of malignancy.  He continues to remain in remission.  He has a follow-up with Dr. Donella Stade next month and will see Dr. Tami Ribas every 3 months.  I will see him back in 6 months with a CBC and CMP   Visit Diagnosis 1. Encounter for follow-up surveillance of pharyngeal cancer      Dr. Randa Evens, MD, MPH [facility] [phone] 10/06/2018 4:24 PM

## 2018-10-06 NOTE — Progress Notes (Signed)
Survivorship Care Plan visit completed.  Treatment summary reviewed and given to patient.  ASCO answers booklet reviewed and given to patient.  CARE program and Cancer Transitions discussed with patient along with other resources cancer center offers to patients and caregivers.  Patient verbalized understanding.    

## 2018-11-09 ENCOUNTER — Other Ambulatory Visit: Payer: Self-pay

## 2018-11-09 ENCOUNTER — Ambulatory Visit
Admission: RE | Admit: 2018-11-09 | Discharge: 2018-11-09 | Disposition: A | Discharge status: Home / Self Care | Payer: Federal, State, Local not specified - PPO | Source: Ambulatory Visit | Attending: Radiation Oncology | Admitting: Radiation Oncology

## 2018-11-09 ENCOUNTER — Encounter: Payer: Self-pay | Admitting: Radiation Oncology

## 2018-11-09 DIAGNOSIS — Z923 Personal history of irradiation: Secondary | ICD-10-CM | POA: Diagnosis not present

## 2018-11-09 DIAGNOSIS — Z9221 Personal history of antineoplastic chemotherapy: Secondary | ICD-10-CM | POA: Insufficient documentation

## 2018-11-09 DIAGNOSIS — Z85818 Personal history of malignant neoplasm of other sites of lip, oral cavity, and pharynx: Secondary | ICD-10-CM | POA: Diagnosis not present

## 2018-11-09 DIAGNOSIS — C099 Malignant neoplasm of tonsil, unspecified: Secondary | ICD-10-CM

## 2018-11-09 NOTE — Progress Notes (Signed)
Radiation Oncology Follow up Note  Name: Stephen Yu   Date:   11/09/2018 MRN:  [id number] DOB: [birth year]    This 62 y.o. male presents to the clinic today for 11 months follow-up status post concurrent chemotherapy radiation therapy for P 16 positive locally advanced (T3 N1 M0) squamous cell carcinoma the right tonsil.  REFERRING PROVIDER: Idelle Crouch, MD  HPI: patient is a 62 year old male who is now 11 months out having completed concurrent chemoradiation therapy for P 16 positive locally advanced squamous cell carcinoma the right tonsillar stage IIIa (T3 N1 M0). Seen today in routine follow-up he is doing well. He specifically denies dysphagia or head and neck pain. He had a PET CT scan back in.October showing no evidence of hypermetabolic activity. He otherwise is doing well.  COMPLICATIONS OF TREATMENT: none  FOLLOW UP COMPLIANCE: keeps appointments   PHYSICAL EXAM:  BP (!) (P) 157/77 (BP Location: Left Arm, Patient Position: Sitting)   Pulse (P) 77   Temp (!) (P) 95.5 F (35.3 C) (Tympanic)   Wt (P) 232 lb 14.7 oz (105.7 kg)   BMI (P) 33.90 kg/m  Oral cavity is clear no oral mucosal lesions are identified. Indirect mirror examination shows upper airway clear vallecula and base of tongue within normal limits. No evidence of sub-digastric cervical or supraclavicular adenopathy is identified.Well-developed well-nourished patient in NAD. HEENT reveals PERLA, EOMI, discs not visualized.  Oral cavity is clear. No oral mucosal lesions are identified. Neck is clear without evidence of cervical or supraclavicular adenopathy. Lungs are clear to A&P. Cardiac examination is essentially unremarkable with regular rate and rhythm without murmur rub or thrill. Abdomen is benign with no organomegaly or masses noted. Motor sensory and DTR levels are equal and symmetric in the upper and lower extremities. Cranial nerves II through XII are grossly intact. Proprioception is intact. No  peripheral adenopathy or edema is identified. No motor or sensory levels are noted. Crude visual fields are within normal range.  RADIOLOGY RESULTS: PET CT scan is reviewed and compatible with the above-stated findings  PLAN: present time patient continues to do well with no evidence of disease. I'm please was overall progress. He continues close follow-up care with ENT and almost monthly basis. I've asked to see him back in 1 year for follow-up. Patient is to call at anytime with any concerns.  I would like to take this opportunity to thank you for allowing me to participate in the care of your patient.Noreene Filbert, MD

## 2018-12-29 ENCOUNTER — Other Ambulatory Visit: Payer: Self-pay | Admitting: Oncology

## 2018-12-31 ENCOUNTER — Telehealth: Payer: Self-pay | Admitting: Urology

## 2018-12-31 MED ORDER — TESTOSTERONE CYPIONATE 200 MG/ML IM SOLN: 200.0000 mg | INTRAMUSCULAR | 0 refills | Status: AC

## 2018-12-31 NOTE — Telephone Encounter (Signed)
This patient has not been seen since starting testosterone back in September.  He is past due for an appointment.  I sent in a limited refill of testosterone.  He needs a follow-up with me with a lab visit prior.  I will not be able to give any additional refills until he is seen.  Was too early for out to happen

## 2018-12-31 NOTE — Telephone Encounter (Signed)
Pt called stating that he is out of Testosterone and has no refills.  Pt was due to have injection yesterday.  Pt asking for a refill to be sent to [facility] on [address] Please advise. Thanks.

## 2019-01-01 ENCOUNTER — Telehealth: Payer: Self-pay

## 2019-01-01 NOTE — Telephone Encounter (Signed)
Called pt to make follow up appt as required, pt got upset that he doesn't have enough testosterone to last him to the appt, tried to make appt for labs prior, pt got upset again and said to "just forget the whole thing, all of this non sense" and hung up. FYI

## 2019-01-01 NOTE — Telephone Encounter (Signed)
Testosterone prior auth processed via covermymeds and had a favorable outcome.

## 2019-01-04 NOTE — Telephone Encounter (Signed)
Noted  

## 2019-01-15 IMAGING — CT NM PET TUM IMG RESTAG (PS) SKULL BASE T - THIGH
1 of 9 series · 1 of 25 positions shown · non-contrast
Comparison: Prior PET-CT dated 09/10/2017

CLINICAL DATA: Subsequent treatment strategy for oropharyngeal
cancer, prior systemic therapy/radiotherapy..

EXAM:
NUCLEAR MEDICINE PET SKULL BASE TO THIGH
TECHNIQUE: 12.4 mCi F-18 FDG was injected intravenously. Full-ring PET imaging
was performed from the skull base to thigh after the radiotracer. CT
data was obtained and used for attenuation correction and anatomic
localization.
FASTING BLOOD GLUCOSE:  Value: 172 mg/dl

[Series 4: ct wb 5.0 b30f · axial · 5.0mm · 0.98mm/px · 1 of 329 slices shown]
[im 329/329  brain]
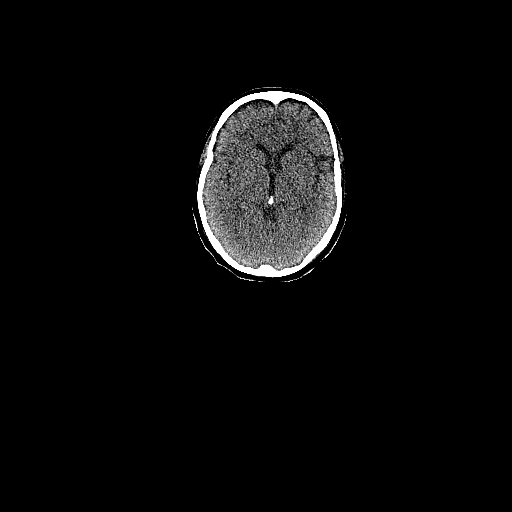

[1 of 25 positions shown; findings below may reference images not displayed]

FINDINGS: NECK

The previous hypermetabolic activity along the right palatine tonsil
has resolved. This previously had a maximum SUV of 8.5 and currently
the maximum SUV in the analogous region is 2.9, and symmetric to the
contralateral side.

A right level IIa lymph node measures 0.8 cm in short axis on image
41/4 (formerly 1.6 cm) with maximum SUV of nodal activity in this
vicinity 2.3 (formerly 4.9). The adjacent right level IIa lymph node
is similarly reduced in size and activity.

No new or hypermetabolic nodal activity in the neck.

CHEST

No hypermetabolic mediastinal or hilar nodes. No suspicious
pulmonary nodules on the CT data. Scarring anteriorly in the left
upper lobe, stable.

Left Port-A-Cath tip: Cavoatrial junction.

Atherosclerotic calcification of the left anterior descending and
right coronary arteries along with the aortic arch.

ABDOMEN/PELVIS

No abnormal hypermetabolic activity within the liver, pancreas,
adrenal glands, or spleen. No hypermetabolic lymph nodes in the
abdomen or pelvis.

Physiologic activity in loops of bowel specially in the right upper
quadrant. Prior cholecystectomy.

Left kidney upper pole exophytic 3.7 cm lesion measures 14
Hounsfield units in density but is photopenic, likely a mildly
complex cyst. Peripancreatic lymph node 0.9 cm in short axis,
previously 1.1 cm, not hypermetabolic.

Aortoiliac atherosclerotic vascular disease. Prior right
hemicolectomy.

SKELETON

No focal hypermetabolic activity to suggest skeletal metastasis.

Note is again made of a pain pump with suspected tubing fragment in
the sacral thecal sac.
IMPRESSION: 1. Complete resolution of the abnormal activity in the right
palatine tonsil. Resolution of the prior hypermetabolic activity in
the right level IIa lymph nodes, which are notably reduced in size.
No current abnormal hypermetabolic activity to suggest active
malignancy in the neck, chest, abdomen, or pelvis.
2. Other imaging findings of potential clinical significance: Aortic
Atherosclerosis ([id number]). Coronary atherosclerosis. Prior right
hemicolectomy. Stable photopenic left kidney upper pole hypodense
lesion, likely a cyst. Stable appearance of pain pump with suspected
discontinuous tubing fragment in the sacral thecal sac.

## 2019-01-25 ENCOUNTER — Other Ambulatory Visit (HOSPITAL_COMMUNITY): Payer: Self-pay | Admitting: Unknown Physician Specialty

## 2019-01-25 ENCOUNTER — Other Ambulatory Visit: Payer: Self-pay | Admitting: Unknown Physician Specialty

## 2019-01-25 DIAGNOSIS — R221 Localized swelling, mass and lump, neck: Secondary | ICD-10-CM

## 2019-01-28 ENCOUNTER — Ambulatory Visit
Admission: RE | Admit: 2019-01-28 | Discharge: 2019-01-28 | Disposition: A | Discharge status: Home / Self Care | Payer: Federal, State, Local not specified - PPO | Source: Ambulatory Visit | Attending: Unknown Physician Specialty | Admitting: Unknown Physician Specialty

## 2019-01-28 DIAGNOSIS — R221 Localized swelling, mass and lump, neck: Secondary | ICD-10-CM | POA: Insufficient documentation

## 2019-02-05 ENCOUNTER — Ambulatory Visit: Payer: Federal, State, Local not specified - PPO | Admitting: Urology

## 2019-04-06 ENCOUNTER — Other Ambulatory Visit: Payer: Self-pay

## 2019-04-06 ENCOUNTER — Inpatient Hospital Stay: Payer: Federal, State, Local not specified - PPO | Attending: Oncology

## 2019-04-06 ENCOUNTER — Inpatient Hospital Stay: Payer: Federal, State, Local not specified - PPO | Admitting: Oncology

## 2019-04-06 ENCOUNTER — Encounter (INDEPENDENT_AMBULATORY_CARE_PROVIDER_SITE_OTHER): Payer: Self-pay

## 2019-04-06 DIAGNOSIS — Z85819 Personal history of malignant neoplasm of unspecified site of lip, oral cavity, and pharynx: Secondary | ICD-10-CM | POA: Diagnosis present

## 2019-04-06 DIAGNOSIS — Z08 Encounter for follow-up examination after completed treatment for malignant neoplasm: Secondary | ICD-10-CM

## 2019-04-06 LAB — COMPREHENSIVE METABOLIC PANEL
ALT: 22 U/L (ref 0–44)
AST: 19 U/L (ref 15–41)
Albumin: 4 g/dL (ref 3.5–5.0)
Alkaline Phosphatase: 56 U/L (ref 38–126)
Anion gap: 9 (ref 5–15)
BUN: 19 mg/dL (ref 8–23)
CO2: 26 mmol/L (ref 22–32)
Calcium: 9 mg/dL (ref 8.9–10.3)
Chloride: 102 mmol/L (ref 98–111)
Creatinine, Ser: 1.25 mg/dL — ABNORMAL HIGH (ref 0.61–1.24)
GFR calc Af Amer: >60 mL/min (ref >60)
GFR calc non Af Amer: >60 mL/min (ref >60)
Glucose, Bld: 212 mg/dL — ABNORMAL HIGH (ref 70–99)
Potassium: 4.4 mmol/L (ref 3.5–5.1)
Sodium: 137 mmol/L (ref 135–145)
Total Bilirubin: 0.9 mg/dL (ref 0.3–1.2)
Total Protein: 6.8 g/dL (ref 6.5–8.1)

## 2019-04-06 LAB — CBC WITH DIFFERENTIAL/PLATELET
Abs Immature Granulocytes: 0.02 10*3/uL (ref 0.00–0.07)
Basophils Absolute: 0 10*3/uL (ref 0.0–0.1)
Basophils Relative: 1 %
Eosinophils Absolute: 0.2 10*3/uL (ref 0.0–0.5)
Eosinophils Relative: 3 %
HCT: 43.3 % (ref 39.0–52.0)
Hemoglobin: 15 g/dL (ref 13.0–17.0)
Immature Granulocytes: 0 %
Lymphocytes Relative: 28 %
Lymphs Abs: 2 10*3/uL (ref 0.7–4.0)
MCH: 29 pg (ref 26.0–34.0)
MCHC: 34.6 g/dL (ref 30.0–36.0)
MCV: 83.8 fL (ref 80.0–100.0)
Monocytes Absolute: 0.8 10*3/uL (ref 0.1–1.0)
Monocytes Relative: 11 %
Neutro Abs: 4.2 10*3/uL (ref 1.7–7.7)
Neutrophils Relative %: 57 %
Platelets: 167 10*3/uL (ref 150–400)
RBC: 5.17 MIL/uL (ref 4.22–5.81)
RDW: 13 % (ref 11.5–15.5)
WBC: 7.3 10*3/uL (ref 4.0–10.5)
nRBC: 0 % (ref 0.0–0.2)

## 2019-04-08 ENCOUNTER — Other Ambulatory Visit: Payer: Self-pay

## 2019-04-09 ENCOUNTER — Encounter: Payer: Self-pay | Admitting: Oncology

## 2019-04-09 ENCOUNTER — Inpatient Hospital Stay (HOSPITAL_BASED_OUTPATIENT_CLINIC_OR_DEPARTMENT_OTHER): Payer: Federal, State, Local not specified - PPO | Admitting: Oncology

## 2019-04-09 DIAGNOSIS — C109 Malignant neoplasm of oropharynx, unspecified: Secondary | ICD-10-CM | POA: Diagnosis not present

## 2019-04-09 DIAGNOSIS — I10 Essential (primary) hypertension: Secondary | ICD-10-CM | POA: Diagnosis not present

## 2019-04-09 DIAGNOSIS — Z79899 Other long term (current) drug therapy: Secondary | ICD-10-CM

## 2019-04-09 DIAGNOSIS — E119 Type 2 diabetes mellitus without complications: Secondary | ICD-10-CM | POA: Diagnosis not present

## 2019-04-09 DIAGNOSIS — E039 Hypothyroidism, unspecified: Secondary | ICD-10-CM

## 2019-04-09 DIAGNOSIS — F418 Other specified anxiety disorders: Secondary | ICD-10-CM

## 2019-04-09 DIAGNOSIS — Z87891 Personal history of nicotine dependence: Secondary | ICD-10-CM

## 2019-04-09 NOTE — Progress Notes (Signed)
Patient states that he has chronic back pain and rates his pain 8 right now and took some breakthrough pain med to help relieve it. He has pain pump 24 hours a day. He still has dry mouth from radiation and he constantly drinks water. He sees ent every 3 months and will get scans through ent and then they also do laryngoscopy with each visit due to he spot on his neck that is swollen. Sometimes it hurts if he turns his neck and stretches the area at his neck it gets tight

## 2019-04-11 ENCOUNTER — Encounter: Payer: Self-pay | Admitting: Oncology

## 2019-04-11 NOTE — Progress Notes (Signed)
I connected with Stephen Yu on 04/11/19 at  2:15 PM EDT by video enabled telemedicine visit and verified that I am speaking with the correct person using two identifiers.   I discussed the limitations, risks, security and privacy concerns of performing an evaluation and management service by telemedicine and the availability of in-person appointments. I also discussed with the patient that there may be a patient responsible charge related to this service. The patient expressed understanding and agreed to proceed.  Other persons participating in the visit and their role in the encounter:  none  Patient's location:  home Provider's location:  home  Chief Complaint:  Routine f/u of head and neck cancer  History of present illness: 1. Patient is a 63 year old male who was found to have a right neck mass about 1 year ago when he underwent morphine pump placement. At that time he was seen by ENT and also went through antibiotic course but was not found to have a malignancy back then. Patient recently underwent a hemicolectomy by Dr. Jamal Collin as he was found to have a colonic polyp which was not resectable on colonoscopy. Shortly following the surgery patient again noticed right sided neck mass. He wasttreated with empiric antibiotics by Dr. Doy Hutching but the mass did not go away and he was referred to Dr. Tami Ribas. Patient was found to have tonsillar asymmetry on NPL exam and underwent CT neck  2. CT soft tissue of the neck. Multiple right-sided lymph nodes at level IIA and 2B between 1.5-1.8 cm in size which were suspicious in appearance.also noted to have started asymmetric soft tissue fullness in the oropharynx or on the right lateral wall in the right palatine tonsil region measuring up to 2.7 cm.  3. 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 This was followed by a PET  CT scan which showed right palatine tonsillar mass with a maximum SUV of 8.5 compatible with malignancy. Several right level IIA lymph nodes are present and are hypermetabolic, larger node measuring 1.6 cm in short axis. No evidence of metastatic spread to the chest abdomen and pelvis or visualized skeleton  5. Patient has been referred to [place] for further management. Currently patient reports some pain and discomfort in his right neck. Denies any difficulty swallowing. His appetite is good and he does not report any unintentional weight loss. He does have chronic pain for which he has a morphine pump in place  6. Cycle 7 of weekly cisplatin completed on 11/18/17   Interval history still has dry mouth and altered taste sensation. He has tried artifical saliva but that has not helped. Appetite is good and he thinks his weight is stable   Review of Systems  Constitutional: Negative for chills, fever, malaise/fatigue and weight loss.  HENT: Negative for congestion, ear discharge and nosebleeds.   Eyes: Negative for blurred vision.  Respiratory: Negative for cough, hemoptysis, sputum production, shortness of breath and wheezing.   Cardiovascular: Negative for chest pain, palpitations, orthopnea and claudication.  Gastrointestinal: Negative for abdominal pain, blood in stool, constipation, diarrhea, heartburn, melena, nausea and vomiting.  Genitourinary: Negative for dysuria, flank pain, frequency, hematuria and urgency.  Musculoskeletal: Negative for back pain, joint pain and myalgias.  Skin: Negative for rash.  Neurological: Negative for dizziness, tingling, focal weakness, seizures, weakness and headaches.  Endo/Heme/Allergies: Does not bruise/bleed easily.  Psychiatric/Behavioral: Negative for depression and suicidal ideas. The patient does  not have insomnia.     Allergies  Allergen Reactions  . Biaxin [Clarithromycin] Other (See Comments)    Severe hiccups  . Ceftin [Cefuroxime Axetil]  Other (See Comments)    Severe hiccups  . Penicillins Hives    Has patient had a PCN reaction causing immediate rash, facial/tongue/throat swelling, SOB or lightheadedness with hypotension: No Has patient had a PCN reaction causing severe rash involving mucus membranes or skin necrosis: unknown Has patient had a PCN reaction that required hospitalization: No Has patient had a PCN reaction occurring within the last 10 years: No If all of the above answers are "NO", then may proceed with Cephalosporin use.     Past Medical History:  Diagnosis Date  . Anxiety   . Arthritis   . Cancer (HCC) 08/21/2017  . Chronic pain 1996   due to work injury  . Colon polyp    adenomatous polyp ascending colon  . Cough    THAT WONT GO AWAY-PT TO SEE DR SPARKS ON 06-10-17 @ 4 PM  . Depression   . Diabetes mellitus without complication (HCC)   . Dysrhythmia    AFIB X 1  . GERD (gastroesophageal reflux disease)   . Herniated lumbar intervertebral disc   . Hypertension   . Hypothyroidism   . Neck pain   . Neuromuscular disorder (HCC)   . Presence of intrathecal pump    Dr. [place] in [place]    Past Surgical History:  Procedure Laterality Date  . COLONOSCOPY  2013   [place]  . COLONOSCOPY WITH PROPOFOL N/A 04/22/2017   Procedure: COLONOSCOPY WITH PROPOFOL;  Surgeon: Skulskie, Martin U, MD;  Location: [facility] END[facility]COPY;  Service: Endoscopy;  Laterality: N/A;  . IMAGE GUIDED SINUS SURGERY    . implanted morphine pump  2016  . IR IMAGING GUIDED PORT INSERTION  09/26/2017  . IR REMOVAL TUN ACCESS W/ PORT W/O FL MOD SED  02/11/2018  . LAPAROSCOPIC CHOLECYSTECTOMY  2013  . PARTIAL COLECTOMY Right 06/17/2017   Procedure: PARTIAL COLECTOMY;  Surgeon: Sankar, Seeplaputhur G, MD;  Location: [facility] ORS;  Service: General;  Laterality: Right;  . UMBILICAL HERNIA REPAIR  2013  . UMBILICAL HERNIA REPAIR  06/17/2017   Procedure: HERNIA REPAIR UMBILICAL ADULT;  Surgeon: Sankar, Seeplaputhur  G, MD;  Location: [facility] ORS;  Service: General;;  . VASECTOMY      Social History   Socioeconomic History  . Marital status: Married    Spouse name: Not on file  . Number of children: Not on file  . Years of education: Not on file  . Highest education level: Not on file  Occupational History  . Not on file  Social Needs  . Financial resource strain: Not on file  . Food insecurity:    Worry: Not on file    Inability: Not on file  . Transportation needs:    Medical: Not on file    Non-medical: Not on file  Tobacco Use  . Smoking status: Former Smoker    Years: 20.00    Types: Cigarettes    Last attempt to quit: 12/03/1999    Years since quitting: 19.3  . Smokeless tobacco: Never Used  Substance and Sexual Activity  . Alcohol use: No  . Drug use: No  . Sexual activity: Yes  Lifestyle  . Physical activity:    Days per week: Not on file    Minutes per session: Not on file  . Stress: Not on file  Relationships  .   Social connections:    Talks on phone: Not on file    Gets together: Not on file    Attends religious service: Not on file    Active member of club or organization: Not on file    Attends meetings of clubs or organizations: Not on file    Relationship status: Not on file  . Intimate partner violence:    Fear of current or ex partner: Not on file    Emotionally abused: Not on file    Physically abused: Not on file    Forced sexual activity: Not on file  Other Topics Concern  . Not on file  Social History Narrative  . Not on file    Family History  Problem Relation Age of Onset  . Breast cancer Cousin 44  . Cancer - Colon Neg Hx   . Prostate cancer Neg Hx      Current Outpatient Medications:  .  amitriptyline (ELAVIL) 100 MG tablet, Take 50 mg by mouth at bedtime as needed for sleep. , Disp: , Rfl:  .  apixaban (ELIQUIS) 5 MG TABS tablet, Take 1 tablet 2 (two) times daily by mouth., Disp: , Rfl:  .  Baclofen 5 MG TABS, TAKE 1 TABLET BY MOUTH THREE  TIMES DAILY FOR HICCUPS, Disp: 42 tablet, Rfl: 0 .  Cholecalciferol (VITAMIN D3) 5000 units TABS, Take 5,000 Units by mouth daily. , Disp: , Rfl:  .  citalopram (CELEXA) 40 MG tablet, Take 40 mg by mouth every morning. , Disp: , Rfl:  .  Continuous Blood Gluc Sensor (FREESTYLE LIBRE 14 DAY SENSOR) MISC, USE 1 KIT UTD, Disp: , Rfl: 11 .  diltiazem (CARDIZEM CD) 240 MG 24 hr capsule, Take 1 capsule daily by mouth., Disp: , Rfl:  .  glyBURIDE-metformin (GLUCOVANCE) 5-500 MG tablet, Take 0.5 tablets by mouth See admin instructions. Takes 0.5 tablet twice daily in morning and bedtime and takes a 3rd dose only if needed for elevated blood sugar, Disp: , Rfl:  .  HYDROcodone-acetaminophen (NORCO) 7.5-325 MG tablet, Take 1 tablet by mouth every 6 (six) hours as needed for moderate pain. , Disp: , Rfl:  .  Insulin Glargine (BASAGLAR KWIKPEN) 100 UNIT/ML SOPN, Inject 32 Units into the skin 2 (two) times daily. , Disp: , Rfl:  .  Insulin Pen Needle (BD PEN NEEDLE NANO U/F) 32G X 4 MM MISC, USE EVERY DAY AS NEEDED, Disp: , Rfl:  .  lisinopril (ZESTRIL) 10 MG tablet, Take 10 mg by mouth daily., Disp: , Rfl:  .  MORPHINE SULFATE IJ, Inject as directed. Morphine 4mg/ml epidural pump. 1.4897 mg/day, Disp: , Rfl:  .  niacin 500 MG tablet, Take 500 mg by mouth 2 (two) times daily with a meal. , Disp: , Rfl:  .  oxymetazoline (AFRIN) 0.05 % nasal spray, Place 1 spray into both nostrils 2 (two) times daily as needed for congestion., Disp: , Rfl:  .  pregabalin (LYRICA) 150 MG capsule, Take 150 mg by mouth 2 (two) times daily., Disp: , Rfl:  .  rosuvastatin (CRESTOR) 10 MG tablet, Take 10 mg by mouth daily. , Disp: , Rfl: 3 .  thyroid (ARMOUR) 32.5 MG tablet, Take 32.5 mg by mouth every morning. , Disp: , Rfl:  .  tiZANidine (ZANAFLEX) 4 MG tablet, Take 4 mg by mouth every 8 (eight) hours as needed for muscle spasms., Disp: , Rfl:  .  vitamin E (VITAMIN E) 400 UNIT capsule, Take 400 Units by mouth daily., Disp: ,   Rfl:   .  zinc gluconate 50 MG tablet, Take 50 mg by mouth daily., Disp: , Rfl:  .  testosterone cypionate (DEPOTESTOSTERONE CYPIONATE) 200 MG/ML injection, Inject 1 mL (200 mg total) into the muscle every 14 (fourteen) days. (Patient not taking: Reported on 04/09/2019), Disp: 3 mL, Rfl: 0 No current facility-administered medications for this visit.   Facility-Administered Medications Ordered in Other Visits:  .  0.9 %  sodium chloride infusion, , Intravenous, Continuous, Burns, Wandra Feinstein, NP, Stopped at 11/11/17 1336 .  heparin lock flush 100 unit/mL, 500 Units, Intravenous, Once, Sindy Guadeloupe, MD  No results found.  No images are attached to the encounter.   CMP Latest Ref Rng & Units 04/06/2019  Glucose 70 - 99 mg/dL 212(H)  BUN 8 - 23 mg/dL 19  Creatinine 0.61 - 1.24 mg/dL 1.25(H)  Sodium 135 - 145 mmol/L 137  Potassium 3.5 - 5.1 mmol/L 4.4  Chloride 98 - 111 mmol/L 102  CO2 22 - 32 mmol/L 26  Calcium 8.9 - 10.3 mg/dL 9.0  Total Protein 6.5 - 8.1 g/dL 6.8  Total Bilirubin 0.3 - 1.2 mg/dL 0.9  Alkaline Phos 38 - 126 U/L 56  AST 15 - 41 U/L 19  ALT 0 - 44 U/L 22   CBC Latest Ref Rng & Units 04/06/2019  WBC 4.0 - 10.5 K/uL 7.3  Hemoglobin 13.0 - 17.0 g/dL 15.0  Hematocrit 39.0 - 52.0 % 43.3  Platelets 150 - 400 K/uL 167     Observation/objective: patient appears in no acute distress over video visit. 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 This is a surveillance f/u visit  Clinically no symptoms worrisome for recurrence. He has a f/u with ENt later this month. USG soft tissue neck showed no recurrent mass/ adenopathy  Dry mouth/ altered taste sensation: secondary to Radiation. stable   Follow-up instructions: f/u in 6 months with cbc with diff and cmp  I discussed the assessment and treatment plan with the patient. The patient was provided an opportunity to ask  questions and all were answered. The patient agreed with the plan and demonstrated an understanding of the instructions.   The patient was advised to call back or seek an in-person evaluation if the symptoms worsen or if the condition fails to improve as anticipated.    Visit Diagnosis: No diagnosis found.  Dr. Randa Evens, MD, MPH [place] [facility] [phone] 04/11/2019 5:15 PM

## 2019-04-29 ENCOUNTER — Other Ambulatory Visit: Payer: Self-pay | Admitting: *Deleted

## 2019-04-29 DIAGNOSIS — C109 Malignant neoplasm of oropharynx, unspecified: Secondary | ICD-10-CM

## 2019-05-22 ENCOUNTER — Emergency Department: Payer: Federal, State, Local not specified - PPO

## 2019-05-22 ENCOUNTER — Emergency Department
Admission: EM | Admit: 2019-05-22 | Discharge: 2019-05-22 | Disposition: A | Discharge status: Home / Self Care | Payer: Federal, State, Local not specified - PPO | Attending: Emergency Medicine | Admitting: Emergency Medicine

## 2019-05-22 ENCOUNTER — Other Ambulatory Visit: Payer: Self-pay

## 2019-05-22 ENCOUNTER — Encounter: Payer: Self-pay | Admitting: Intensive Care

## 2019-05-22 DIAGNOSIS — M25572 Pain in left ankle and joints of left foot: Secondary | ICD-10-CM | POA: Diagnosis present

## 2019-05-22 DIAGNOSIS — Z5321 Procedure and treatment not carried out due to patient leaving prior to being seen by health care provider: Secondary | ICD-10-CM | POA: Diagnosis not present

## 2019-05-22 NOTE — ED Triage Notes (Addendum)
PAtient hit left inner ankle with sledge hammer this afternoon. No laceration noted. Patient has chronic back pain and prescribed norco. Also has morphine pump.

## 2019-05-22 NOTE — ED Notes (Signed)
Patient up to desk asking about wait time.  While attempting to explain to patient he states "I'm not waiting here 8 hours like all those other people".  Explained that no one had been waiting for 8 hours, patient responds "they've been here for at least 3 hours".  Informed patient that yes patients had been waiting for several hours  Attempted to again explain that treatment rooms were full at this time and patient responded "aren't there any doctors seeing them" again explained that yes MDs were seeing patients and waiting test results.  Patient demanded to know x-ray results explained I was not able to give those to him and patients says "I have chronic back pain, I have a morphine pump.  I'd be more comfortable at home."  Apologized for wait and attempted to assure patient he would be seen as as possible and patient responded he was not going to wait.

## 2019-06-11 DIAGNOSIS — E1142 Type 2 diabetes mellitus with diabetic polyneuropathy: Secondary | ICD-10-CM | POA: Insufficient documentation

## 2019-06-11 DIAGNOSIS — E1165 Type 2 diabetes mellitus with hyperglycemia: Secondary | ICD-10-CM | POA: Insufficient documentation

## 2019-07-20 ENCOUNTER — Other Ambulatory Visit: Payer: Self-pay

## 2019-07-20 ENCOUNTER — Encounter: Payer: Self-pay | Admitting: Dietician

## 2019-07-20 ENCOUNTER — Encounter: Payer: Federal, State, Local not specified - PPO | Attending: Internal Medicine | Admitting: Dietician

## 2019-07-20 VITALS — Ht 70.0 in | Wt 215.7 lb

## 2019-07-20 DIAGNOSIS — E1169 Type 2 diabetes mellitus with other specified complication: Secondary | ICD-10-CM | POA: Diagnosis not present

## 2019-07-20 DIAGNOSIS — Z794 Long term (current) use of insulin: Secondary | ICD-10-CM

## 2019-07-20 NOTE — Progress Notes (Signed)
Medical Nutrition Therapy:  Visit start time:1500   end time: 1600  Assessment:  Diagnosis: Type 2 Diabetes Past medical history: history of oropharyngeal cancer, chronic back pain,hypertension,  Psychosocial issues/ stress concerns:  Patient rates his stress as "high"; rates his back pain as 8-10 most of the time.  Preferred learning method:  . Auditory . Visual . Hands-on Current weight: 215.7  Height:70 in BMI: 30.9 Medications, supplements: see list  Progress and evaluation:  Patient accompanied by his wife in for initial nutrition therapy appointment. His HgA1c increased from 7.9 (11/'19) to 10.1 (5/'20). He reports that blood sugars have significantly improved since adding Ozempic to his medication regimen. Reports an average glucose of 117 for the past 12 days.   He also has experienced very few low blood sugars since MD stopped glyburide. Reports a highest weight of 289 lbs in 2016 and lost 40 lbs due to cancer dx and treatment. Reports a relatively stable weight in the last year. He states he would like to lose weight and gave a goal of 185 lbs. He states he makes an effort "to stay away from sugar and carbs". He likes a variety of vegetables and fruits. Most meals are prepared at home.   Physical activity: He walks when his back pain does not limit.  Dietary Intake:  Usual eating pattern includes 3 meals and 1-2 snacks per day. Dining out frequency: 0-2 meals per week.  Breakfast: 9:00- Bacon, cheese, egg croissant or a bacon, cheese, egg, potato bowl, diet Dr. Malachi Bonds or egg sandwich Lunch: 11-1:00pm- ham or [place] sandwich or tomato sandwich and ear of corn or pasta salad with meat and veggies added, Dr. Malachi Bonds Supper: 5-6:00pm- Ex. Roast, potatoes, carrots, sliced tomatoes, fried okra ; most meats are baked or grilled. Loves vegetables; has a garden Snack: not every night but does eat ice cream or trail mix Beverages: 6-7 cups water/day, 8+ cups of sugar free drinks  (soda, Crystal lite, coffee)   Nutrition Care Education: Diabetes:   Instructed on a meal plan for diabetes and to promote weight loss based on 1800 calories including carbohydrate counting, portion control and the balance of carbohydrate, protein, fat and non-starchy vegetables. Gave and reviewed sample menus. Reviewed label reading for serving size and carbohydrate gms and saturated fat. Also, gave dietary guidelines for saturated fat.  Intervention:  Balance meals with 2-4 oz protein, 3-4 servings of carbohydrate (fruit, starch, milk/yogurt) and non-starchy vegetables. Spread 13 servings of carbohydrate over 3 meals and 1-2 snacks. Measure some portions of carbohydrate foods, especially starches. Read labels for carbohydrate grams remembering that 15 gms of carbohydrate equals one serving. Saturated fat recommendation: no more than 15 gms per day. Keep fast acting glucose with you at all times. Education Materials given:  . Plate Planner . Food lists/ Planning A Balanced Meal . Combination Foods . Sample meal pattern/ menus . Snacking handout . Goals/ instructions  Learner/ who was taught:  . Patient  . Spouse/ partner  Level of understanding: Marland Kitchen Verbalizes understanding  Demonstrated degree of understanding via:   Teach back Learning barriers: . None Willingness to learn/ readiness for change: . Change in progress  Monitoring and Evaluation:  No follow-up was scheduled. Encouraged to call if has further questions regarding his diet/nutrition.

## 2019-07-20 NOTE — Patient Instructions (Signed)
Balance meals with 2-4 oz protein, 3-4 servings of carbohydrate (fruit, starch, milk/yogurt) and non-starchy vegetables. Spread 13 servings of carbohydrate over 3 meals and 1-2 snacks. Measure some portions of carbohydrate foods, especially starches. Read labels for carbohydrate grams remembering that 15 gms of carbohydrate equals one serving. Saturated fat recommendation: no more than 15 gms per day. Keep fast acting glucose with you at all times.

## 2019-10-07 ENCOUNTER — Other Ambulatory Visit: Payer: Self-pay

## 2019-10-07 ENCOUNTER — Encounter: Payer: Self-pay | Admitting: Oncology

## 2019-10-07 NOTE — Progress Notes (Signed)
Patient pre screened for office appointment, no questions or concerns today. 

## 2019-10-08 ENCOUNTER — Other Ambulatory Visit: Payer: Self-pay | Admitting: *Deleted

## 2019-10-08 ENCOUNTER — Inpatient Hospital Stay: Payer: Federal, State, Local not specified - PPO | Admitting: Oncology

## 2019-10-08 ENCOUNTER — Other Ambulatory Visit: Payer: Self-pay

## 2019-10-08 ENCOUNTER — Inpatient Hospital Stay: Payer: Federal, State, Local not specified - PPO | Attending: Oncology

## 2019-10-08 VITALS — BP 124/72 | HR 82 | Temp 98.3°F | Resp 16 | Wt 215.3 lb

## 2019-10-08 DIAGNOSIS — K219 Gastro-esophageal reflux disease without esophagitis: Secondary | ICD-10-CM | POA: Insufficient documentation

## 2019-10-08 DIAGNOSIS — I4891 Unspecified atrial fibrillation: Secondary | ICD-10-CM | POA: Insufficient documentation

## 2019-10-08 DIAGNOSIS — Z794 Long term (current) use of insulin: Secondary | ICD-10-CM | POA: Diagnosis not present

## 2019-10-08 DIAGNOSIS — Z87891 Personal history of nicotine dependence: Secondary | ICD-10-CM | POA: Insufficient documentation

## 2019-10-08 DIAGNOSIS — Z79899 Other long term (current) drug therapy: Secondary | ICD-10-CM | POA: Insufficient documentation

## 2019-10-08 DIAGNOSIS — Z08 Encounter for follow-up examination after completed treatment for malignant neoplasm: Secondary | ICD-10-CM

## 2019-10-08 DIAGNOSIS — R7989 Other specified abnormal findings of blood chemistry: Secondary | ICD-10-CM

## 2019-10-08 DIAGNOSIS — Z85819 Personal history of malignant neoplasm of unspecified site of lip, oral cavity, and pharynx: Secondary | ICD-10-CM | POA: Diagnosis not present

## 2019-10-08 DIAGNOSIS — E039 Hypothyroidism, unspecified: Secondary | ICD-10-CM | POA: Insufficient documentation

## 2019-10-08 DIAGNOSIS — E119 Type 2 diabetes mellitus without complications: Secondary | ICD-10-CM | POA: Diagnosis not present

## 2019-10-08 DIAGNOSIS — Z8589 Personal history of malignant neoplasm of other organs and systems: Secondary | ICD-10-CM

## 2019-10-08 DIAGNOSIS — F418 Other specified anxiety disorders: Secondary | ICD-10-CM | POA: Diagnosis not present

## 2019-10-08 DIAGNOSIS — I10 Essential (primary) hypertension: Secondary | ICD-10-CM | POA: Diagnosis not present

## 2019-10-08 DIAGNOSIS — N179 Acute kidney failure, unspecified: Secondary | ICD-10-CM | POA: Diagnosis not present

## 2019-10-08 DIAGNOSIS — Z803 Family history of malignant neoplasm of breast: Secondary | ICD-10-CM | POA: Diagnosis not present

## 2019-10-08 DIAGNOSIS — Z7901 Long term (current) use of anticoagulants: Secondary | ICD-10-CM | POA: Diagnosis not present

## 2019-10-08 DIAGNOSIS — C109 Malignant neoplasm of oropharynx, unspecified: Secondary | ICD-10-CM

## 2019-10-08 DIAGNOSIS — Z86010 Personal history of colonic polyps: Secondary | ICD-10-CM | POA: Insufficient documentation

## 2019-10-08 LAB — COMPREHENSIVE METABOLIC PANEL
ALT: 23 U/L (ref 0–44)
AST: 17 U/L (ref 15–41)
Albumin: 4.3 g/dL (ref 3.5–5.0)
Alkaline Phosphatase: 49 U/L (ref 38–126)
Anion gap: 5 (ref 5–15)
BUN: 20 mg/dL (ref 8–23)
CO2: 29 mmol/L (ref 22–32)
Calcium: 9.3 mg/dL (ref 8.9–10.3)
Chloride: 99 mmol/L (ref 98–111)
Creatinine, Ser: 1.31 mg/dL — ABNORMAL HIGH (ref 0.61–1.24)
GFR calc Af Amer: >60 mL/min (ref >60)
GFR calc non Af Amer: 57 mL/min — ABNORMAL LOW (ref >60)
Glucose, Bld: 164 mg/dL — ABNORMAL HIGH (ref 70–99)
Potassium: 4.7 mmol/L (ref 3.5–5.1)
Sodium: 133 mmol/L — ABNORMAL LOW (ref 135–145)
Total Bilirubin: 1.6 mg/dL — ABNORMAL HIGH (ref 0.3–1.2)
Total Protein: 7.5 g/dL (ref 6.5–8.1)

## 2019-10-08 LAB — CBC WITH DIFFERENTIAL/PLATELET
Abs Immature Granulocytes: 0.08 10*3/uL — ABNORMAL HIGH (ref 0.00–0.07)
Basophils Absolute: 0.1 10*3/uL (ref 0.0–0.1)
Basophils Relative: 1 %
Eosinophils Absolute: 0.3 10*3/uL (ref 0.0–0.5)
Eosinophils Relative: 4 %
HCT: 50 % (ref 39.0–52.0)
Hemoglobin: 16.7 g/dL (ref 13.0–17.0)
Immature Granulocytes: 1 %
Lymphocytes Relative: 22 %
Lymphs Abs: 2 10*3/uL (ref 0.7–4.0)
MCH: 28.7 pg (ref 26.0–34.0)
MCHC: 33.4 g/dL (ref 30.0–36.0)
MCV: 86.1 fL (ref 80.0–100.0)
Monocytes Absolute: 1.1 10*3/uL — ABNORMAL HIGH (ref 0.1–1.0)
Monocytes Relative: 12 %
Neutro Abs: 5.6 10*3/uL (ref 1.7–7.7)
Neutrophils Relative %: 60 %
Platelets: 175 10*3/uL (ref 150–400)
RBC: 5.81 MIL/uL (ref 4.22–5.81)
RDW: 13.8 % (ref 11.5–15.5)
WBC: 9.2 10*3/uL (ref 4.0–10.5)
nRBC: 0 % (ref 0.0–0.2)

## 2019-10-08 NOTE — Progress Notes (Signed)
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 Patient is a 63 year old male who was found to have a right neck mass about 1 year ago when he underwent morphine pump placement. At that time he was seen by ENT and also went through antibiotic course but was not found to have a malignancy back then. Patient recently underwent a hemicolectomy by Dr. Jamal Collin as he was found to have a colonic polyp which was not resectable on colonoscopy. Shortly following the surgery patient again noticed right sided neck mass. He wasttreated with empiric antibiotics by Dr. Doy Hutching but the mass did not go away and he was referred to Dr. Tami Ribas. Patient was found to have tonsillar asymmetry on NPL exam and underwent CT neck  2. CT soft tissue of the neck. Multiple right-sided lymph nodes at level IIA and 2B between 1.5-1.8 cm in size which were suspicious in appearance.also noted to have started asymmetric soft tissue fullness in the oropharynx or on the right lateral wall in the right palatine tonsil region measuring up to 2.7 cm.  3. 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 This was followed by a PET CT scan which showed right  palatine tonsillar mass with a maximum SUV of 8.5 compatible with malignancy. Several right level IIA lymph nodes are present and are hypermetabolic, larger node measuring 1.6 cm in short axis. No evidence of metastatic spread to the chest abdomen and pelvis or visualized skeleton  5. Patient has been referred to [place] for further management. Currently patient reports some pain and discomfort in his right neck. Denies any difficulty swallowing. His appetite is good and he does not report any unintentional weight loss. He does have chronic pain for which he has a morphine pump in place  6. Cycle 7 of weekly cisplatin completed on 11/18/17   Interval history-has issues with chronic dry mouth.  Denies other complaints ECOG PS- 1 Pain scale- 0   Review of systems- Review of Systems  Constitutional: Positive for malaise/fatigue. Negative for chills, fever and weight loss.  HENT: Negative for congestion, ear discharge and nosebleeds.        Dry mouth  Eyes: Negative for blurred vision.  Respiratory: Negative for cough, hemoptysis, sputum production, shortness of breath and wheezing.   Cardiovascular: Negative for chest pain, palpitations, orthopnea and claudication.  Gastrointestinal: Negative for abdominal pain, blood in stool, constipation, diarrhea, heartburn, melena, nausea and vomiting.  Genitourinary: Negative for dysuria, flank pain, frequency, hematuria and urgency.  Musculoskeletal: Negative for back pain, joint pain and myalgias.  Skin: Negative for rash.  Neurological: Negative for dizziness, tingling, focal weakness, seizures, weakness and headaches.  Endo/Heme/Allergies: Does not bruise/bleed easily.  Psychiatric/Behavioral: Negative for depression and suicidal ideas. The patient does not have insomnia.        Allergies  Allergen Reactions  . Biaxin [  Clarithromycin] Other (See Comments)    Severe hiccups  . Ceftin [Cefuroxime Axetil] Other (See Comments)    Severe hiccups   . Penicillins Hives    Has patient had a PCN reaction causing immediate rash, facial/tongue/throat swelling, SOB or lightheadedness with hypotension: No Has patient had a PCN reaction causing severe rash involving mucus membranes or skin necrosis: unknown Has patient had a PCN reaction that required hospitalization: No Has patient had a PCN reaction occurring within the last 10 years: No If all of the above answers are "NO", then may proceed with Cephalosporin use.      Past Medical History:  Diagnosis Date  . Anxiety   . Arthritis   . Cancer ([place]) 08/21/2017  . Chronic pain 1996   due to work injury  . Colon polyp    adenomatous polyp ascending colon  . Cough    THAT WONT GO AWAY-PT TO SEE DR Doy Hutching ON 06-10-17 @ 4 PM  . Depression   . Diabetes mellitus without complication ([place])   . Dysrhythmia    AFIB X 1  . GERD (gastroesophageal reflux disease)   . Herniated lumbar intervertebral disc   . Hypertension   . Hypothyroidism   . Neck pain   . Neuromuscular disorder ([place])   . Presence of intrathecal pump    Dr. Mechele Dawley in [place], [place]     Past Surgical History:  Procedure Laterality Date  . COLONOSCOPY  2013   [place]  . COLONOSCOPY WITH PROPOFOL N/A 04/22/2017   Procedure: COLONOSCOPY WITH PROPOFOL;  Surgeon: Lollie Sails, MD;  Location: [facility] ENDOSCOPY;  Service: Endoscopy;  Laterality: N/A;  . IMAGE GUIDED SINUS SURGERY    . implanted morphine pump  2016  . IR IMAGING GUIDED PORT INSERTION  09/26/2017  . IR REMOVAL TUN ACCESS W/ PORT W/O FL MOD SED  02/11/2018  . LAPAROSCOPIC CHOLECYSTECTOMY  2013  . PARTIAL COLECTOMY Right 06/17/2017   Procedure: PARTIAL COLECTOMY;  Surgeon: Christene Lye, MD;  Location: [facility] ORS;  Service: General;  Laterality: Right;  . UMBILICAL HERNIA REPAIR  2013  . UMBILICAL HERNIA REPAIR  06/17/2017   Procedure: HERNIA REPAIR UMBILICAL ADULT;  Surgeon: Christene Lye, MD;  Location: [facility] ORS;  Service:  General;;  . VASECTOMY      Social History   Socioeconomic History  . Marital status: Married    Spouse name: Not on file  . Number of children: Not on file  . Years of education: Not on file  . Highest education level: Not on file  Occupational History  . Not on file  Social Needs  . Financial resource strain: Not on file  . Food insecurity    Worry: Not on file    Inability: Not on file  . Transportation needs    Medical: Not on file    Non-medical: Not on file  Tobacco Use  . Smoking status: Former Smoker    Years: 20.00    Types: Cigarettes    Quit date: 12/03/1999    Years since quitting: 19.8  . Smokeless tobacco: Never Used  Substance and Sexual Activity  . Alcohol use: No  . Drug use: No  . Sexual activity: Yes  Lifestyle  . Physical activity    Days per week: Not on file    Minutes per session: Not on file  . Stress: Not on file  Relationships  . Social connections    Talks on phone: Not on file  Gets together: Not on file    Attends religious service: Not on file    Active member of club or organization: Not on file    Attends meetings of clubs or organizations: Not on file    Relationship status: Not on file  . Intimate partner violence    Fear of current or ex partner: Not on file    Emotionally abused: Not on file    Physically abused: Not on file    Forced sexual activity: Not on file  Other Topics Concern  . Not on file  Social History Narrative  . Not on file    Family History  Problem Relation Age of Onset  . Breast cancer Cousin 86  . Cancer - Colon Neg Hx   . Prostate cancer Neg Hx      Current Outpatient Medications:  .  amitriptyline (ELAVIL) 100 MG tablet, Take 50 mg by mouth at bedtime as needed for sleep. , Disp: , Rfl:  .  apixaban (ELIQUIS) 5 MG TABS tablet, Take 1 tablet 2 (two) times daily by mouth., Disp: , Rfl:  .  Baclofen 5 MG TABS, TAKE 1 TABLET BY MOUTH THREE TIMES DAILY FOR HICCUPS, Disp: 42 tablet, Rfl: 0 .   Cholecalciferol (VITAMIN D3) 5000 units TABS, Take 5,000 Units by mouth daily. , Disp: , Rfl:  .  citalopram (CELEXA) 40 MG tablet, Take 40 mg by mouth every morning. , Disp: , Rfl:  .  Continuous Blood Gluc Sensor (FREESTYLE LIBRE 14 DAY SENSOR) MISC, USE 1 KIT UTD, Disp: , Rfl: 11 .  diltiazem (CARDIZEM CD) 240 MG 24 hr capsule, Take 1 capsule daily by mouth., Disp: , Rfl:  .  glyBURIDE-metformin (GLUCOVANCE) 5-500 MG tablet, Take 0.5 tablets by mouth See admin instructions. Takes 0.5 tablet twice daily in morning and bedtime and takes a 3rd dose only if needed for elevated blood sugar, Disp: , Rfl:  .  HYDROcodone-acetaminophen (NORCO) 7.5-325 MG tablet, Take 1 tablet by mouth every 6 (six) hours as needed for moderate pain. , Disp: , Rfl:  .  Insulin Glargine (BASAGLAR KWIKPEN) 100 UNIT/ML SOPN, Inject 54 Units into the skin once. , Disp: , Rfl:  .  Insulin Pen Needle (BD PEN NEEDLE NANO U/F) 32G X 4 MM MISC, USE EVERY DAY AS NEEDED, Disp: , Rfl:  .  lisinopril (ZESTRIL) 10 MG tablet, Take 10 mg by mouth daily., Disp: , Rfl:  .  metFORMIN (GLUCOPHAGE) 500 MG tablet, Take 500 mg by mouth 2 (two) times daily with a meal., Disp: , Rfl:  .  MORPHINE SULFATE IJ, Inject as directed. Morphine 62m/ml epidural pump. 1.4897 mg/day, Disp: , Rfl:  .  niacin 500 MG tablet, Take 500 mg by mouth 2 (two) times daily with a meal. , Disp: , Rfl:  .  oxymetazoline (AFRIN) 0.05 % nasal spray, Place 1 spray into both nostrils 2 (two) times daily as needed for congestion., Disp: , Rfl:  .  pregabalin (LYRICA) 150 MG capsule, Take 150 mg by mouth 2 (two) times daily., Disp: , Rfl:  .  rosuvastatin (CRESTOR) 10 MG tablet, Take 10 mg by mouth daily. , Disp: , Rfl: 3 .  Semaglutide (OZEMPIC, 0.25 OR 0.5 MG/DOSE, [place]), Inject into the skin once a week., Disp: , Rfl:  .  testosterone cypionate (DEPOTESTOSTERONE CYPIONATE) 200 MG/ML injection, Inject 1 mL (200 mg total) into the muscle every 14 (fourteen) days., Disp: 3 mL,  Rfl: 0 .  thyroid (ARMOUR) 32.5 MG  tablet, Take 32.5 mg by mouth every morning. , Disp: , Rfl:  .  tiZANidine (ZANAFLEX) 4 MG tablet, Take 4 mg by mouth every 8 (eight) hours as needed for muscle spasms., Disp: , Rfl:  .  vitamin E (VITAMIN E) 400 UNIT capsule, Take 400 Units by mouth daily., Disp: , Rfl:  .  zinc gluconate 50 MG tablet, Take 50 mg by mouth daily., Disp: , Rfl:  No current facility-administered medications for this visit.   Facility-Administered Medications Ordered in Other Visits:  .  0.9 %  sodium chloride infusion, , Intravenous, Continuous, Burns, Wandra Feinstein, NP, Stopped at 11/11/17 1336 .  heparin lock flush 100 unit/mL, 500 Units, Intravenous, Once, Sindy Guadeloupe, MD  Physical exam:  Vitals:   10/08/19 1100  BP: 124/72  Pulse: 82  Resp: 16  Temp: 98.3 F (36.8 C)  TempSrc: Temporal  SpO2: 96%  Weight: 215 lb 4.8 oz (97.7 kg)   Physical Exam Constitutional:      General: He is not in acute distress. HENT:     Head: Normocephalic and atraumatic.     Mouth/Throat:     Mouth: Mucous membranes are dry.     Pharynx: Oropharynx is clear.  Eyes:     Pupils: Pupils are equal, round, and reactive to light.  Neck:     Musculoskeletal: Normal range of motion.     Comments: That is induration over right neck just below the mandible likely radiation scarring Cardiovascular:     Rate and Rhythm: Normal rate and regular rhythm.     Heart sounds: Normal heart sounds.  Pulmonary:     Effort: Pulmonary effort is normal.     Breath sounds: Normal breath sounds.  Abdominal:     General: Bowel sounds are normal.     Palpations: Abdomen is soft.  Lymphadenopathy:     Comments: No palpable cervical adenopathy  Skin:    General: Skin is warm and dry.  Neurological:     Mental Status: He is alert and oriented to person, place, and time.      CMP Latest Ref Rng & Units 10/08/2019  Glucose 70 - 99 mg/dL 164(H)  BUN 8 - 23 mg/dL 20  Creatinine 0.61 - 1.24 mg/dL  1.31(H)  Sodium 135 - 145 mmol/L 133(L)  Potassium 3.5 - 5.1 mmol/L 4.7  Chloride 98 - 111 mmol/L 99  CO2 22 - 32 mmol/L 29  Calcium 8.9 - 10.3 mg/dL 9.3  Total Protein 6.5 - 8.1 g/dL 7.5  Total Bilirubin 0.3 - 1.2 mg/dL 1.6(H)  Alkaline Phos 38 - 126 U/L 49  AST 15 - 41 U/L 17  ALT 0 - 44 U/L 23   CBC Latest Ref Rng & Units 10/08/2019  WBC 4.0 - 10.5 K/uL 9.2  Hemoglobin 13.0 - 17.0 g/dL 16.7  Hematocrit 39.0 - 52.0 % 50.0  Platelets 150 - 400 K/uL 175      Assessment and plan- Patient is a 63 y.o. male with a h/o Stage I SCC oropharynx s/p concurrent chemo/RT currently in remission.  He is here for routine follow-up  Clinically he is doing well and no concerning signs and symptoms of recurrence based on today's exam.  He also had an ultrasound soft tissue neck in February 2020 which showed no mass/adenopathy  AKI: His creatinine did normalize after treatment and over the last 5 months it is trending back up and up to 1.3 today.  I have encouraged him to improve his oral  fluid intake and we will recheck it next month when he comes for his radiation oncology appointment  Elevated bilirubin: LFTs are otherwise normal.  He has had intermittent mildly elevated bilirubin in the past.  Continue to monitor  I will see him back in 6 months with labs   Visit Diagnosis 1. Encounter for follow-up surveillance of head and neck cancer      Dr. Randa Evens, MD, MPH [facility] at [facility] [phone] 10/08/2019 2:29 PM

## 2019-11-01 DIAGNOSIS — E291 Testicular hypofunction: Secondary | ICD-10-CM | POA: Insufficient documentation

## 2019-11-17 ENCOUNTER — Other Ambulatory Visit: Payer: Self-pay

## 2019-11-18 ENCOUNTER — Inpatient Hospital Stay: Payer: Federal, State, Local not specified - PPO | Attending: Radiation Oncology

## 2019-11-18 ENCOUNTER — Other Ambulatory Visit: Payer: Self-pay

## 2019-11-18 ENCOUNTER — Ambulatory Visit
Admission: RE | Admit: 2019-11-18 | Discharge: 2019-11-18 | Disposition: A | Discharge status: Home / Self Care | Payer: Federal, State, Local not specified - PPO | Source: Ambulatory Visit | Attending: Radiation Oncology | Admitting: Radiation Oncology

## 2019-11-18 VITALS — BP 116/76 | HR 72 | Temp 97.4°F | Resp 16 | Wt 218.6 lb

## 2019-11-18 DIAGNOSIS — Z9221 Personal history of antineoplastic chemotherapy: Secondary | ICD-10-CM | POA: Diagnosis not present

## 2019-11-18 DIAGNOSIS — Z8589 Personal history of malignant neoplasm of other organs and systems: Secondary | ICD-10-CM | POA: Insufficient documentation

## 2019-11-18 DIAGNOSIS — C099 Malignant neoplasm of tonsil, unspecified: Secondary | ICD-10-CM

## 2019-11-18 DIAGNOSIS — Z85818 Personal history of malignant neoplasm of other sites of lip, oral cavity, and pharynx: Secondary | ICD-10-CM | POA: Diagnosis present

## 2019-11-18 DIAGNOSIS — Z923 Personal history of irradiation: Secondary | ICD-10-CM | POA: Insufficient documentation

## 2019-11-18 DIAGNOSIS — R7989 Other specified abnormal findings of blood chemistry: Secondary | ICD-10-CM

## 2019-11-18 LAB — COMPREHENSIVE METABOLIC PANEL
ALT: 21 U/L (ref 0–44)
AST: 19 U/L (ref 15–41)
Albumin: 4.3 g/dL (ref 3.5–5.0)
Alkaline Phosphatase: 45 U/L (ref 38–126)
Anion gap: 9 (ref 5–15)
BUN: 17 mg/dL (ref 8–23)
CO2: 28 mmol/L (ref 22–32)
Calcium: 9.3 mg/dL (ref 8.9–10.3)
Chloride: 99 mmol/L (ref 98–111)
Creatinine, Ser: 1.29 mg/dL — ABNORMAL HIGH (ref 0.61–1.24)
GFR calc Af Amer: >60 mL/min (ref >60)
GFR calc non Af Amer: 58 mL/min — ABNORMAL LOW (ref >60)
Glucose, Bld: 217 mg/dL — ABNORMAL HIGH (ref 70–99)
Potassium: 4.7 mmol/L (ref 3.5–5.1)
Sodium: 136 mmol/L (ref 135–145)
Total Bilirubin: 1.2 mg/dL (ref 0.3–1.2)
Total Protein: 7.1 g/dL (ref 6.5–8.1)

## 2019-11-18 NOTE — Progress Notes (Signed)
Radiation Oncology Follow up Note  Name: Stephen Yu   Date:   11/18/2019 MRN:  [id number] DOB: [birth year]    This 63 y.o. 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  REFERRING PROVIDER: Idelle Crouch, MD  HPI: Patient is a 63 year old male now at 2 years having completed concurrent chemoradiation therapy for stage IIIa right tonsillar cancer.  Seen today in routine follow-up he is doing well specifically denies dysphagia or head and neck pain.  He had a PET scan.  Back in October showing no evidence of disease.  He also had an ultrasound of his head and neck back in February evaluating some thickness in the right side of the neck which was unremarkable.  He is under close observation by ENT.  COMPLICATIONS OF TREATMENT: none  FOLLOW UP COMPLIANCE: keeps appointments   PHYSICAL EXAM:  BP 116/76   Pulse 72   Temp (!) 97.4 F (36.3 C)   Resp 16   Wt 218 lb 9.6 oz (99.2 kg)   SpO2 94%   BMI 31.37 kg/m  No evidence of cervical or supraclavicular adenopathy.  Well-developed well-nourished patient in NAD. HEENT reveals PERLA, EOMI, discs not visualized.  Oral cavity is clear. No oral mucosal lesions are identified. Neck is clear without evidence of cervical or supraclavicular adenopathy. Lungs are clear to A&P. Cardiac examination is essentially unremarkable with regular rate and rhythm without murmur rub or thrill. Abdomen is benign with no organomegaly or masses noted. Motor sensory and DTR levels are equal and symmetric in the upper and lower extremities. Cranial nerves II through XII are grossly intact. Proprioception is intact. No peripheral adenopathy or edema is identified. No motor or sensory levels are noted. Crude visual fields are within normal range.  RADIOLOGY RESULTS: PET/CT as well as ultrasound reviewed compatible with  above-stated findings  PLAN: Present time patient is 2 years out from locally advanced tonsillar cancer with no evidence of disease.  I am pleased with his overall progress.  I will turn follow-up care over to ENT since they are better equipped to perform head and neck examination.  I would be happy to reevaluate the patient anytime should further consultation be indicated.  Patient knows to call with any concerns.  I would like to take this opportunity to thank you for allowing me to participate in the care of your patient.Noreene Filbert, MD

## 2020-04-06 ENCOUNTER — Encounter: Payer: Self-pay | Admitting: Oncology

## 2020-04-06 ENCOUNTER — Inpatient Hospital Stay: Payer: Federal, State, Local not specified - PPO | Attending: Oncology

## 2020-04-06 ENCOUNTER — Other Ambulatory Visit: Payer: Self-pay

## 2020-04-06 ENCOUNTER — Inpatient Hospital Stay (HOSPITAL_BASED_OUTPATIENT_CLINIC_OR_DEPARTMENT_OTHER): Payer: Federal, State, Local not specified - PPO | Admitting: Oncology

## 2020-04-06 VITALS — BP 116/68 | HR 65 | Temp 97.8°F | Resp 16 | Wt 221.0 lb

## 2020-04-06 DIAGNOSIS — Z79899 Other long term (current) drug therapy: Secondary | ICD-10-CM | POA: Insufficient documentation

## 2020-04-06 DIAGNOSIS — Z87891 Personal history of nicotine dependence: Secondary | ICD-10-CM | POA: Insufficient documentation

## 2020-04-06 DIAGNOSIS — Z8589 Personal history of malignant neoplasm of other organs and systems: Secondary | ICD-10-CM | POA: Diagnosis not present

## 2020-04-06 DIAGNOSIS — K219 Gastro-esophageal reflux disease without esophagitis: Secondary | ICD-10-CM | POA: Diagnosis not present

## 2020-04-06 DIAGNOSIS — G8929 Other chronic pain: Secondary | ICD-10-CM | POA: Insufficient documentation

## 2020-04-06 DIAGNOSIS — N182 Chronic kidney disease, stage 2 (mild): Secondary | ICD-10-CM | POA: Diagnosis not present

## 2020-04-06 DIAGNOSIS — E039 Hypothyroidism, unspecified: Secondary | ICD-10-CM | POA: Diagnosis not present

## 2020-04-06 DIAGNOSIS — Z7901 Long term (current) use of anticoagulants: Secondary | ICD-10-CM | POA: Insufficient documentation

## 2020-04-06 DIAGNOSIS — E119 Type 2 diabetes mellitus without complications: Secondary | ICD-10-CM | POA: Diagnosis not present

## 2020-04-06 DIAGNOSIS — Z794 Long term (current) use of insulin: Secondary | ICD-10-CM | POA: Diagnosis not present

## 2020-04-06 DIAGNOSIS — Z08 Encounter for follow-up examination after completed treatment for malignant neoplasm: Secondary | ICD-10-CM

## 2020-04-06 DIAGNOSIS — F418 Other specified anxiety disorders: Secondary | ICD-10-CM | POA: Insufficient documentation

## 2020-04-06 DIAGNOSIS — I4891 Unspecified atrial fibrillation: Secondary | ICD-10-CM | POA: Diagnosis not present

## 2020-04-06 DIAGNOSIS — I129 Hypertensive chronic kidney disease with stage 1 through stage 4 chronic kidney disease, or unspecified chronic kidney disease: Secondary | ICD-10-CM | POA: Diagnosis not present

## 2020-04-06 DIAGNOSIS — Z86010 Personal history of colonic polyps: Secondary | ICD-10-CM | POA: Insufficient documentation

## 2020-04-06 DIAGNOSIS — Z85819 Personal history of malignant neoplasm of unspecified site of lip, oral cavity, and pharynx: Secondary | ICD-10-CM | POA: Insufficient documentation

## 2020-04-06 LAB — CBC WITH DIFFERENTIAL/PLATELET
Abs Immature Granulocytes: 0.07 10*3/uL (ref 0.00–0.07)
Basophils Absolute: 0.1 10*3/uL (ref 0.0–0.1)
Basophils Relative: 1 %
Eosinophils Absolute: 0.3 10*3/uL (ref 0.0–0.5)
Eosinophils Relative: 3 %
HCT: 49.2 % (ref 39.0–52.0)
Hemoglobin: 16.4 g/dL (ref 13.0–17.0)
Immature Granulocytes: 1 %
Lymphocytes Relative: 24 %
Lymphs Abs: 1.8 10*3/uL (ref 0.7–4.0)
MCH: 29.5 pg (ref 26.0–34.0)
MCHC: 33.3 g/dL (ref 30.0–36.0)
MCV: 88.6 fL (ref 80.0–100.0)
Monocytes Absolute: 0.9 10*3/uL (ref 0.1–1.0)
Monocytes Relative: 12 %
Neutro Abs: 4.6 10*3/uL (ref 1.7–7.7)
Neutrophils Relative %: 59 %
Platelets: 199 10*3/uL (ref 150–400)
RBC: 5.55 MIL/uL (ref 4.22–5.81)
RDW: 13.2 % (ref 11.5–15.5)
WBC: 7.7 10*3/uL (ref 4.0–10.5)
nRBC: 0 % (ref 0.0–0.2)

## 2020-04-06 LAB — COMPREHENSIVE METABOLIC PANEL
ALT: 30 U/L (ref 0–44)
AST: 28 U/L (ref 15–41)
Albumin: 4.1 g/dL (ref 3.5–5.0)
Alkaline Phosphatase: 46 U/L (ref 38–126)
Anion gap: 9 (ref 5–15)
BUN: 18 mg/dL (ref 8–23)
CO2: 28 mmol/L (ref 22–32)
Calcium: 9.3 mg/dL (ref 8.9–10.3)
Chloride: 97 mmol/L — ABNORMAL LOW (ref 98–111)
Creatinine, Ser: 1.32 mg/dL — ABNORMAL HIGH (ref 0.61–1.24)
GFR calc Af Amer: >60 mL/min (ref >60)
GFR calc non Af Amer: 56 mL/min — ABNORMAL LOW (ref >60)
Glucose, Bld: 125 mg/dL — ABNORMAL HIGH (ref 70–99)
Potassium: 4.7 mmol/L (ref 3.5–5.1)
Sodium: 134 mmol/L — ABNORMAL LOW (ref 135–145)
Total Bilirubin: 1.2 mg/dL (ref 0.3–1.2)
Total Protein: 7 g/dL (ref 6.5–8.1)

## 2020-04-06 NOTE — Progress Notes (Signed)
Patient here for oncology follow-up appointment concerns of back pain and new spot on back per patient.

## 2020-04-10 NOTE — Progress Notes (Signed)
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 Patient is a 64 year old male who was found to have a right neck mass about 1 year ago when he underwent morphine pump placement. At that time he was seen by ENT and also went through antibiotic course but was not found to have a malignancy back then. Patient recently underwent a hemicolectomy by Dr. Jamal Collin as he was found to have a colonic polyp which was not resectable on colonoscopy. Shortly following the surgery patient again noticed right sided neck mass. He wasttreated with empiric antibiotics by Dr. Doy Hutching but the mass did not go away and he was referred to Dr. Tami Ribas. Patient was found to have tonsillar asymmetry on NPL exam and underwent CT neck  2. CT soft tissue of the neck. Multiple right-sided lymph nodes at level IIA and 2B between 1.5-1.8 cm in size which were suspicious in appearance.also noted to have started asymmetric soft tissue fullness in the oropharynx or on the right lateral wall in the right palatine tonsil region measuring up to 2.7 cm.  3. 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 This was followed by a  PET CT scan which showed right palatine tonsillar mass with a maximum SUV of 8.5 compatible with malignancy. Several right level IIA lymph nodes are present and are hypermetabolic, larger node measuring 1.6 cm in short axis. No evidence of metastatic spread to the chest abdomen and pelvis or visualized skeleton  5. Patient has been referred to [place] for further management. Currently patient reports some pain and discomfort in his right neck. Denies any difficulty swallowing. His appetite is good and he does not report any unintentional weight loss. He does have chronic pain for which he has a morphine pump in place  6. Cycle 7 of weekly cisplatin completed on 11/18/17   Interval history-patient continues to have problems with dry mouth and altered taste sensation.  Appetite and weight are otherwise stable.  Denies any new aches and pains anywhere.  Denies any difficulty swallowing  ECOG PS- 0 Pain scale- 0   Review of systems- Review of Systems  Constitutional: Negative for chills, fever, malaise/fatigue and weight loss.  HENT: Negative for congestion, ear discharge and nosebleeds.        Dry mouth  Eyes: Negative for blurred vision.  Respiratory: Negative for cough, hemoptysis, sputum production, shortness of breath and wheezing.   Cardiovascular: Negative for chest pain, palpitations, orthopnea and claudication.  Gastrointestinal: Negative for abdominal pain, blood in stool, constipation, diarrhea, heartburn, melena, nausea and vomiting.  Genitourinary: Negative for dysuria, flank pain, frequency, hematuria and urgency.  Musculoskeletal: Negative for back pain, joint pain and myalgias.  Skin: Negative for rash.  Neurological: Negative for dizziness, tingling, focal weakness, seizures, weakness and headaches.  Endo/Heme/Allergies: Does not bruise/bleed easily.  Psychiatric/Behavioral: Negative  for depression and suicidal ideas. The patient does not have insomnia.        Allergies    Allergen Reactions  . Biaxin [Clarithromycin] Other (See Comments)    Severe hiccups  . Ceftin [Cefuroxime Axetil] Other (See Comments)    Severe hiccups  . Penicillins Hives    Has patient had a PCN reaction causing immediate rash, facial/tongue/throat swelling, SOB or lightheadedness with hypotension: No Has patient had a PCN reaction causing severe rash involving mucus membranes or skin necrosis: unknown Has patient had a PCN reaction that required hospitalization: No Has patient had a PCN reaction occurring within the last 10 years: No If all of the above answers are "NO", then may proceed with Cephalosporin use.      Past Medical History:  Diagnosis Date  . Anxiety   . Arthritis   . Cancer ([place]) 08/21/2017  . Chronic pain 1996   due to work injury  . Colon polyp    adenomatous polyp ascending colon  . Cough    THAT WONT GO AWAY-PT TO SEE DR Doy Hutching ON 06-10-17 @ 4 PM  . Depression   . Diabetes mellitus without complication ([place])   . Dysrhythmia    AFIB X 1  . GERD (gastroesophageal reflux disease)   . Herniated lumbar intervertebral disc   . Hypertension   . Hypothyroidism   . Neck pain   . Neuromuscular disorder ([place])   . Presence of intrathecal pump    Dr. Mechele Dawley in [place], [place]     Past Surgical History:  Procedure Laterality Date  . COLONOSCOPY  2013   [place]  . COLONOSCOPY WITH PROPOFOL N/A 04/22/2017   Procedure: COLONOSCOPY WITH PROPOFOL;  Surgeon: Lollie Sails, MD;  Location: [facility] ENDOSCOPY;  Service: Endoscopy;  Laterality: N/A;  . IMAGE GUIDED SINUS SURGERY    . implanted morphine pump  2016  . IR IMAGING GUIDED PORT INSERTION  09/26/2017  . IR REMOVAL TUN ACCESS W/ PORT W/O FL MOD SED  02/11/2018  . LAPAROSCOPIC CHOLECYSTECTOMY  2013  . PARTIAL COLECTOMY Right 06/17/2017   Procedure: PARTIAL COLECTOMY;  Surgeon: Christene Lye, MD;  Location: [facility] ORS;  Service: General;  Laterality: Right;  . UMBILICAL HERNIA  REPAIR  2013  . UMBILICAL HERNIA REPAIR  06/17/2017   Procedure: HERNIA REPAIR UMBILICAL ADULT;  Surgeon: Christene Lye, MD;  Location: [facility] ORS;  Service: General;;  . VASECTOMY      Social History   Socioeconomic History  . Marital status: Married    Spouse name: Not on file  . Number of children: Not on file  . Years of education: Not on file  . Highest education level: Not on file  Occupational History  . Not on file  Tobacco Use  . Smoking status: Former Smoker    Years: 20.00    Types: Cigarettes    Quit date: 12/03/1999    Years since quitting: 20.3  . Smokeless tobacco: Never Used  Substance and Sexual Activity  . Alcohol use: No  . Drug use: No  . Sexual activity: Yes  Other Topics Concern  . Not on file  Social History Narrative  . Not on file   Social Determinants of Health   Financial Resource Strain:   . Difficulty of Paying Living Expenses:   Food Insecurity:   . Worried About Charity fundraiser in the Last Year:   . Arboriculturist in the Last Year:   [employer]  Needs:   . Lack of Transportation (Medical):   Marland Kitchen Lack of Transportation (Non-Medical):   Physical Activity:   . Days of Exercise per Week:   . Minutes of Exercise per Session:   Stress:   . Feeling of Stress :   Social Connections:   . Frequency of Communication with Friends and Family:   . Frequency of Social Gatherings with Friends and Family:   . Attends Religious Services:   . Active Member of Clubs or Organizations:   . Attends Archivist Meetings:   Marland Kitchen Marital Status:   Intimate Partner Violence:   . Fear of Current or Ex-Partner:   . Emotionally Abused:   Marland Kitchen Physically Abused:   . Sexually Abused:     Family History  Problem Relation Age of Onset  . Breast cancer Cousin 33  . Cancer - Colon Neg Hx   . Prostate cancer Neg Hx      Current Outpatient Medications:  .  apixaban (ELIQUIS) 5 MG TABS tablet, Take 1 tablet 2 (two) times daily by mouth.,  Disp: , Rfl:  .  Cholecalciferol (VITAMIN D3) 5000 units TABS, Take 5,000 Units by mouth daily. , Disp: , Rfl:  .  citalopram (CELEXA) 40 MG tablet, Take 40 mg by mouth every morning. , Disp: , Rfl:  .  Continuous Blood Gluc Sensor (FREESTYLE LIBRE 14 DAY SENSOR) MISC, USE 1 KIT UTD, Disp: , Rfl: 11 .  HYDROcodone-acetaminophen (NORCO) 7.5-325 MG tablet, Take 1 tablet by mouth every 6 (six) hours as needed for moderate pain. , Disp: , Rfl:  .  Insulin Glargine (BASAGLAR KWIKPEN) 100 UNIT/ML SOPN, Inject 54 Units into the skin once. , Disp: , Rfl:  .  Insulin Pen Needle (BD PEN NEEDLE NANO U/F) 32G X 4 MM MISC, USE EVERY DAY AS NEEDED, Disp: , Rfl:  .  lisinopril (ZESTRIL) 10 MG tablet, Take 10 mg by mouth daily., Disp: , Rfl:  .  metFORMIN (GLUCOPHAGE) 500 MG tablet, Take 500 mg by mouth 2 (two) times daily with a meal., Disp: , Rfl:  .  MORPHINE SULFATE IJ, Inject as directed. Morphine 68m/ml epidural pump. 1.4897 mg/day, Disp: , Rfl:  .  oxymetazoline (AFRIN) 0.05 % nasal spray, Place 1 spray into both nostrils 2 (two) times daily as needed for congestion., Disp: , Rfl:  .  pregabalin (LYRICA) 150 MG capsule, Take 150 mg by mouth 2 (two) times daily., Disp: , Rfl:  .  rosuvastatin (CRESTOR) 10 MG tablet, Take 10 mg by mouth daily. , Disp: , Rfl: 3 .  Semaglutide (OZEMPIC, 0.25 OR 0.5 MG/DOSE, [place]), Inject into the skin once a week., Disp: , Rfl:  .  testosterone cypionate (DEPOTESTOSTERONE CYPIONATE) 200 MG/ML injection, Inject 1 mL (200 mg total) into the muscle every 14 (fourteen) days., Disp: 3 mL, Rfl: 0 .  tiZANidine (ZANAFLEX) 4 MG tablet, Take 4 mg by mouth every 8 (eight) hours as needed for muscle spasms., Disp: , Rfl:  .  traZODone (DESYREL) 50 MG tablet, Take 50 mg by mouth at bedtime., Disp: , Rfl:  .  vitamin E (VITAMIN E) 400 UNIT capsule, Take 400 Units by mouth daily., Disp: , Rfl:  .  zinc gluconate 50 MG tablet, Take 50 mg by mouth daily., Disp: , Rfl:  .  amitriptyline (ELAVIL)  100 MG tablet, Take 50 mg by mouth at bedtime as needed for sleep. , Disp: , Rfl:  .  Baclofen 5 MG TABS, TAKE 1 TABLET BY  MOUTH THREE TIMES DAILY FOR HICCUPS (Patient not taking: Reported on 04/06/2020), Disp: 42 tablet, Rfl: 0 .  diltiazem (CARDIZEM CD) 240 MG 24 hr capsule, Take 1 capsule daily by mouth., Disp: , Rfl:  .  glyBURIDE-metformin (GLUCOVANCE) 5-500 MG tablet, Take 0.5 tablets by mouth See admin instructions. Takes 0.5 tablet twice daily in morning and bedtime and takes a 3rd dose only if needed for elevated blood sugar, Disp: , Rfl:  .  niacin 500 MG tablet, Take 500 mg by mouth 2 (two) times daily with a meal. , Disp: , Rfl:  .  thyroid (ARMOUR) 32.5 MG tablet, Take 32.5 mg by mouth every morning. , Disp: , Rfl:  No current facility-administered medications for this visit.  Facility-Administered Medications Ordered in Other Visits:  .  0.9 %  sodium chloride infusion, , Intravenous, Continuous, Burns, Wandra Feinstein, NP, Stopped at 11/11/17 1336 .  heparin lock flush 100 unit/mL, 500 Units, Intravenous, Once, Sindy Guadeloupe, MD  Physical exam:  Vitals:   04/06/20 1351  BP: 116/68  Pulse: 65  Resp: 16  Temp: 97.8 F (36.6 C)  TempSrc: Tympanic  SpO2: 98%  Weight: 221 lb (100.2 kg)   Physical Exam Constitutional:      General: He is not in acute distress. Neck:     Comments: No palpable cervical adenopathy Cardiovascular:     Rate and Rhythm: Normal rate and regular rhythm.     Heart sounds: Normal heart sounds.  Pulmonary:     Effort: Pulmonary effort is normal.     Breath sounds: Normal breath sounds.  Abdominal:     General: Bowel sounds are normal.     Palpations: Abdomen is soft.  Skin:    General: Skin is warm and dry.  Neurological:     Mental Status: He is alert and oriented to person, place, and time.      CMP Latest Ref Rng & Units 04/06/2020  Glucose 70 - 99 mg/dL 125(H)  BUN 8 - 23 mg/dL 18  Creatinine 0.61 - 1.24 mg/dL 1.32(H)  Sodium 135 - 145  mmol/L 134(L)  Potassium 3.5 - 5.1 mmol/L 4.7  Chloride 98 - 111 mmol/L 97(L)  CO2 22 - 32 mmol/L 28  Calcium 8.9 - 10.3 mg/dL 9.3  Total Protein 6.5 - 8.1 g/dL 7.0  Total Bilirubin 0.3 - 1.2 mg/dL 1.2  Alkaline Phos 38 - 126 U/L 46  AST 15 - 41 U/L 28  ALT 0 - 44 U/L 30   CBC Latest Ref Rng & Units 04/06/2020  WBC 4.0 - 10.5 K/uL 7.7  Hemoglobin 13.0 - 17.0 g/dL 16.4  Hematocrit 39.0 - 52.0 % 49.2  Platelets 150 - 400 K/uL 199     Assessment and plan- Patient is a 63 y.o. male  with a h/o Stage I SCC oropharynx s/p concurrent chemo/RT currently in remission.   He is here for a routine follow-up  Clinically patient is doing well and no concerning signs and symptoms of recurrence based on today's exam.  Patient is following up with ENT for routine NPL exams.  Patient had  elevated bilirubin in the pastwhich fluctuates but has remained overall stable.  He also has mild CKD which is stable.  He is over 2 years out since his diagnosis.  I will now plan to see him on a yearly basis   Visit Diagnosis 1. Encounter for follow-up surveillance of head and neck cancer      Dr. Randa Evens, MD, MPH  [facility] [phone] 04/10/2020 9:43 AM

## 2020-05-23 ENCOUNTER — Other Ambulatory Visit: Payer: Self-pay

## 2020-05-23 ENCOUNTER — Encounter: Payer: Self-pay | Admitting: Ophthalmology

## 2020-05-25 NOTE — Discharge Instructions (Signed)
General Anesthesia, Adult, Care After This sheet gives you information about how to care for yourself after your procedure. Your health care provider may also give you more specific instructions. If you have problems or questions, contact your health care provider. What can I expect after the procedure? After the procedure, the following side effects are common:  Pain or discomfort at the IV site.  Nausea.  Vomiting.  Sore throat.  Trouble concentrating.  Feeling cold or chills.  Weak or tired.  Sleepiness and fatigue.  Soreness and body aches. These side effects can affect parts of the body that were not involved in surgery. Follow these instructions at home:  For at least 24 hours after the procedure:  Have a responsible adult stay with you. It is important to have someone help care for you until you are awake and alert.  Rest as needed.  Do not: ? Participate in activities in which you could fall or become injured. ? Drive. ? Use heavy machinery. ? Drink alcohol. ? Take sleeping pills or medicines that cause drowsiness. ? Make important decisions or sign legal documents. ? Take care of children on your own. Eating and drinking  Follow any instructions from your health care provider about eating or drinking restrictions.  When you feel hungry, start by eating small amounts of foods that are soft and easy to digest (bland), such as toast. Gradually return to your regular diet.  Drink enough fluid to keep your urine pale yellow.  If you vomit, rehydrate by drinking water, juice, or clear broth. General instructions  If you have sleep apnea, surgery and certain medicines can increase your risk for breathing problems. Follow instructions from your health care provider about wearing your sleep device: ? Anytime you are sleeping, including during daytime naps. ? While taking prescription pain medicines, sleeping medicines, or medicines that make you drowsy.  Return to  your normal activities as told by your health care provider. Ask your health care provider what activities are safe for you.  Take over-the-counter and prescription medicines only as told by your health care provider.  If you smoke, do not smoke without supervision.  Keep all follow-up visits as told by your health care provider. This is important. Contact a health care provider if:  You have nausea or vomiting that does not get better with medicine.  You cannot eat or drink without vomiting.  You have pain that does not get better with medicine.  You are unable to pass urine.  You develop a skin rash.  You have a fever.  You have redness around your IV site that gets worse. Get help right away if:  You have difficulty breathing.  You have chest pain.  You have blood in your urine or stool, or you vomit blood. Summary  After the procedure, it is common to have a sore throat or nausea. It is also common to feel tired.  Have a responsible adult stay with you for the first 24 hours after general anesthesia. It is important to have someone help care for you until you are awake and alert.  When you feel hungry, start by eating small amounts of foods that are soft and easy to digest (bland), such as toast. Gradually return to your regular diet.  Drink enough fluid to keep your urine pale yellow.  Return to your normal activities as told by your health care provider. Ask your health care provider what activities are safe for you. This information is not   intended to replace advice given to you by your health care provider. Make sure you discuss any questions you have with your health care provider. Document Revised: 11/21/2017 Document Reviewed: 07/04/2017 Elsevier Patient Education  2020 Elsevier Inc.  Cataract Surgery, Care After This sheet gives you information about how to care for yourself after your procedure. Your health care provider may also give you more specific  instructions. If you have problems or questions, contact your health care provider. What can I expect after the procedure? After the procedure, it is common to have:  Itching.  Discomfort.  Fluid discharge.  Sensitivity to light and to touch.  Bruising in or around the eye.  Mild blurred vision. Follow these instructions at home: Eye care   Do not touch or rub your eyes.  Protect your eyes as told by your health care provider. You may be told to wear a protective eye shield or sunglasses.  Do not put a contact lens into the affected eye or eyes until your health care provider approves.  Keep the area around your eye clean and dry: ? Avoid swimming. ? Do not allow water to hit you directly in the face while showering. ? Keep soap and shampoo out of your eyes.  Check your eye every day for signs of infection. Watch for: ? Redness, swelling, or pain. ? Fluid, blood, or pus. ? Warmth. ? A bad smell. ? Vision that is getting worse. ? Sensitivity that is getting worse. Activity  Do not drive for 24 hours if you were given a sedative during your procedure.  Avoid strenuous activities, such as playing contact sports, for as long as told by your health care provider.  Do not drive or use heavy machinery until your health care provider approves.  Do not bend or lift heavy objects. Bending increases pressure in the eye. You can walk, climb stairs, and do light household chores.  Ask your health care provider when you can return to work. If you work in a dusty environment, you may be advised to wear protective eyewear for a period of time. General instructions  Take or apply over-the-counter and prescription medicines only as told by your health care provider. This includes eye drops.  Keep all follow-up visits as told by your health care provider. This is important. Contact a health care provider if:  You have increased bruising around your eye.  You have pain that is  not helped with medicine.  You have a fever.  You have redness, swelling, or pain in your eye.  You have fluid, blood, or pus coming from your incision.  Your vision gets worse.  Your sensitivity to light gets worse. Get help right away if:  You have sudden loss of vision.  You see flashes of light or spots (floaters).  You have severe eye pain.  You develop nausea or vomiting. Summary  After your procedure, it is common to have itching, discomfort, bruising, fluid discharge, or sensitivity to light.  Follow instructions from your health care provider about caring for your eye after the procedure.  Do not rub your eye after the procedure. You may need to wear eye protection or sunglasses. Do not wear contact lenses. Keep the area around your eye clean and dry.  Avoid activities that require a lot of effort. These include playing sports and lifting heavy objects.  Contact a health care provider if you have increased bruising, pain that does not go away, or a fever. Get help right   away if you suddenly lose your vision, see flashes of light or spots, or have severe pain in the eye. This information is not intended to replace advice given to you by your health care provider. Make sure you discuss any questions you have with your health care provider. Document Revised: 09/14/2019 Document Reviewed: 05/18/2018 Elsevier Patient Education  2020 Elsevier Inc.  

## 2020-05-26 ENCOUNTER — Other Ambulatory Visit: Payer: Self-pay

## 2020-05-26 ENCOUNTER — Other Ambulatory Visit
Admission: RE | Admit: 2020-05-26 | Discharge: 2020-05-26 | Disposition: A | Discharge status: Home / Self Care | Payer: Federal, State, Local not specified - PPO | Source: Ambulatory Visit | Attending: Ophthalmology | Admitting: Ophthalmology

## 2020-05-26 DIAGNOSIS — Z20822 Contact with and (suspected) exposure to covid-19: Secondary | ICD-10-CM | POA: Diagnosis not present

## 2020-05-26 DIAGNOSIS — Z01812 Encounter for preprocedural laboratory examination: Secondary | ICD-10-CM | POA: Insufficient documentation

## 2020-05-27 LAB — SARS CORONAVIRUS 2 (TAT 6-24 HRS): SARS Coronavirus 2: NEGATIVE

## 2020-05-30 ENCOUNTER — Encounter
Admission: RE | Disposition: A | Discharge status: Home / Self Care | Payer: Self-pay | Source: Home / Self Care | Attending: Ophthalmology

## 2020-05-30 ENCOUNTER — Ambulatory Visit: Payer: Federal, State, Local not specified - PPO | Admitting: Anesthesiology

## 2020-05-30 ENCOUNTER — Encounter: Payer: Self-pay | Admitting: Ophthalmology

## 2020-05-30 ENCOUNTER — Ambulatory Visit
Admission: RE | Admit: 2020-05-30 | Discharge: 2020-05-30 | Disposition: A | Discharge status: Home / Self Care | Payer: Federal, State, Local not specified - PPO | Attending: Ophthalmology | Admitting: Ophthalmology

## 2020-05-30 ENCOUNTER — Other Ambulatory Visit: Payer: Self-pay

## 2020-05-30 DIAGNOSIS — I4891 Unspecified atrial fibrillation: Secondary | ICD-10-CM | POA: Diagnosis not present

## 2020-05-30 DIAGNOSIS — F329 Major depressive disorder, single episode, unspecified: Secondary | ICD-10-CM | POA: Diagnosis not present

## 2020-05-30 DIAGNOSIS — Z881 Allergy status to other antibiotic agents status: Secondary | ICD-10-CM | POA: Insufficient documentation

## 2020-05-30 DIAGNOSIS — M1990 Unspecified osteoarthritis, unspecified site: Secondary | ICD-10-CM | POA: Insufficient documentation

## 2020-05-30 DIAGNOSIS — Z79891 Long term (current) use of opiate analgesic: Secondary | ICD-10-CM | POA: Insufficient documentation

## 2020-05-30 DIAGNOSIS — E1136 Type 2 diabetes mellitus with diabetic cataract: Secondary | ICD-10-CM | POA: Insufficient documentation

## 2020-05-30 DIAGNOSIS — E7800 Pure hypercholesterolemia, unspecified: Secondary | ICD-10-CM | POA: Diagnosis not present

## 2020-05-30 DIAGNOSIS — Z87891 Personal history of nicotine dependence: Secondary | ICD-10-CM | POA: Diagnosis not present

## 2020-05-30 DIAGNOSIS — E1142 Type 2 diabetes mellitus with diabetic polyneuropathy: Secondary | ICD-10-CM | POA: Insufficient documentation

## 2020-05-30 DIAGNOSIS — F419 Anxiety disorder, unspecified: Secondary | ICD-10-CM | POA: Insufficient documentation

## 2020-05-30 DIAGNOSIS — Z85819 Personal history of malignant neoplasm of unspecified site of lip, oral cavity, and pharynx: Secondary | ICD-10-CM | POA: Insufficient documentation

## 2020-05-30 DIAGNOSIS — I10 Essential (primary) hypertension: Secondary | ICD-10-CM | POA: Diagnosis not present

## 2020-05-30 DIAGNOSIS — E785 Hyperlipidemia, unspecified: Secondary | ICD-10-CM | POA: Diagnosis not present

## 2020-05-30 DIAGNOSIS — Z7901 Long term (current) use of anticoagulants: Secondary | ICD-10-CM | POA: Insufficient documentation

## 2020-05-30 DIAGNOSIS — Z7984 Long term (current) use of oral hypoglycemic drugs: Secondary | ICD-10-CM | POA: Diagnosis not present

## 2020-05-30 DIAGNOSIS — Z880 Allergy status to penicillin: Secondary | ICD-10-CM | POA: Diagnosis not present

## 2020-05-30 DIAGNOSIS — Z9221 Personal history of antineoplastic chemotherapy: Secondary | ICD-10-CM | POA: Insufficient documentation

## 2020-05-30 DIAGNOSIS — H2511 Age-related nuclear cataract, right eye: Secondary | ICD-10-CM | POA: Insufficient documentation

## 2020-05-30 DIAGNOSIS — Z923 Personal history of irradiation: Secondary | ICD-10-CM | POA: Insufficient documentation

## 2020-05-30 DIAGNOSIS — Z79899 Other long term (current) drug therapy: Secondary | ICD-10-CM | POA: Diagnosis not present

## 2020-05-30 HISTORY — PX: CATARACT EXTRACTION W/PHACO: SHX586

## 2020-05-30 HISTORY — DX: Dizziness and giddiness: R42

## 2020-05-30 HISTORY — DX: Carpal tunnel syndrome, unspecified upper limb: G56.00

## 2020-05-30 LAB — GLUCOSE, CAPILLARY
Glucose-Capillary: 145 mg/dL — ABNORMAL HIGH (ref 70–99)
Glucose-Capillary: 135 mg/dL — ABNORMAL HIGH (ref 70–99)

## 2020-05-30 SURGERY — PHACOEMULSIFICATION, CATARACT, WITH IOL INSERTION
Anesthesia: Monitor Anesthesia Care | Site: Eye | Laterality: Right

## 2020-05-30 MED ORDER — LACTATED RINGERS IV SOLN: INTRAVENOUS | Status: DC

## 2020-05-30 MED ORDER — MOXIFLOXACIN HCL 0.5 % OP SOLN
OPHTHALMIC | Status: DC | PRN
  Administered 2020-05-30: 0.2 mL via OPHTHALMIC

## 2020-05-30 MED ORDER — ARMC OPHTHALMIC DILATING DROPS
1.0000 "application " | OPHTHALMIC | Status: DC | PRN
  Administered 2020-05-30 (×3): 1 via OPHTHALMIC

## 2020-05-30 MED ORDER — FENTANYL CITRATE (PF) 100 MCG/2ML IJ SOLN
INTRAMUSCULAR | Status: DC | PRN
  Administered 2020-05-30: 50 ug via INTRAVENOUS

## 2020-05-30 MED ORDER — ONDANSETRON HCL 4 MG/2ML IJ SOLN: 4.0000 mg | Freq: Once | INTRAMUSCULAR | Status: DC | PRN

## 2020-05-30 MED ORDER — TETRACAINE HCL 0.5 % OP SOLN
1.0000 [drp] | OPHTHALMIC | Status: DC | PRN
  Administered 2020-05-30 (×3): 1 [drp] via OPHTHALMIC

## 2020-05-30 MED ORDER — LIDOCAINE HCL (PF) 2 % IJ SOLN
INTRAOCULAR | Status: DC | PRN
  Administered 2020-05-30: 2 mL

## 2020-05-30 MED ORDER — NA CHONDROIT SULF-NA HYALURON 40-17 MG/ML IO SOLN
INTRAOCULAR | Status: DC | PRN
  Administered 2020-05-30: 1 mL via INTRAOCULAR

## 2020-05-30 MED ORDER — EPINEPHRINE PF 1 MG/ML IJ SOLN
INTRAOCULAR | Status: DC | PRN
  Administered 2020-05-30: 63 mL via OPHTHALMIC

## 2020-05-30 MED ORDER — MIDAZOLAM HCL 2 MG/2ML IJ SOLN
INTRAMUSCULAR | Status: DC | PRN
  Administered 2020-05-30: 1 mg via INTRAVENOUS

## 2020-05-30 MED ORDER — ACETAMINOPHEN 10 MG/ML IV SOLN: 1000.0000 mg | Freq: Once | INTRAVENOUS | Status: DC | PRN

## 2020-05-30 MED ORDER — BRIMONIDINE TARTRATE-TIMOLOL 0.2-0.5 % OP SOLN
OPHTHALMIC | Status: DC | PRN
  Administered 2020-05-30: 1 [drp] via OPHTHALMIC

## 2020-05-30 SURGICAL SUPPLY — 20 items
CANNULA ANT/CHMB 27G (MISCELLANEOUS) ×2 IMPLANT
CANNULA ANT/CHMB 27GA (MISCELLANEOUS) ×6 IMPLANT
GLOVE SURG LX 8.0 MICRO (GLOVE) ×2
GLOVE SURG LX STRL 8.0 MICRO (GLOVE) ×1 IMPLANT
GLOVE SURG TRIUMPH 8.0 PF LTX (GLOVE) ×3 IMPLANT
GOWN STRL REUS W/ TWL LRG LVL3 (GOWN DISPOSABLE) ×2 IMPLANT
GOWN STRL REUS W/TWL LRG LVL3 (GOWN DISPOSABLE) ×4
LENS IOL DIOP 20.5 (Intraocular Lens) ×3 IMPLANT
LENS IOL TECNIS MONO 20.5 (Intraocular Lens) IMPLANT
MARKER SKIN DUAL TIP RULER LAB (MISCELLANEOUS) ×3 IMPLANT
NDL FILTER BLUNT 18X1 1/2 (NEEDLE) ×1 IMPLANT
NEEDLE FILTER BLUNT 18X 1/2SAF (NEEDLE) ×2
NEEDLE FILTER BLUNT 18X1 1/2 (NEEDLE) ×1 IMPLANT
PACK EYE AFTER SURG (MISCELLANEOUS) ×3 IMPLANT
PACK OPTHALMIC (MISCELLANEOUS) ×3 IMPLANT
PACK PORFILIO (MISCELLANEOUS) ×3 IMPLANT
SYR 3ML LL SCALE MARK (SYRINGE) ×3 IMPLANT
SYR TB 1ML LUER SLIP (SYRINGE) ×3 IMPLANT
WATER STERILE IRR 250ML POUR (IV SOLUTION) ×3 IMPLANT
WIPE NON LINTING 3.25X3.25 (MISCELLANEOUS) ×3 IMPLANT

## 2020-05-30 NOTE — Op Note (Signed)
PREOPERATIVE DIAGNOSIS:  Nuclear sclerotic cataract of the right eye.   POSTOPERATIVE DIAGNOSIS:  H25.11 Cataract   OPERATIVE PROCEDURE:@   SURGEON:  Birder Robson, MD.   ANESTHESIA:  Anesthesiologist: Heniser, Fredric Dine, MD CRNA: Cameron Ali, CRNA  1.      Managed anesthesia care. 2.      0.48ml of Shugarcaine was instilled in the eye following the paracentesis.   COMPLICATIONS:  None.   TECHNIQUE:   Stop and chop   DESCRIPTION OF PROCEDURE:  The patient was examined and consented in the preoperative holding area where the aforementioned topical anesthesia was applied to the right eye and then brought back to the Operating Room where the right eye was prepped and draped in the usual sterile ophthalmic fashion and a lid speculum was placed. A paracentesis was created with the side port blade and the anterior chamber was filled with viscoelastic. A near clear corneal incision was performed with the steel keratome. A continuous curvilinear capsulorrhexis was performed with a cystotome followed by the capsulorrhexis forceps. Hydrodissection and hydrodelineation were carried out with BSS on a blunt cannula. The lens was removed in a stop and chop  technique and the remaining cortical material was removed with the irrigation-aspiration handpiece. The capsular bag was inflated with viscoelastic and the Technis [id number]  lens was placed in the capsular bag without complication. The remaining viscoelastic was removed from the eye with the irrigation-aspiration handpiece. The wounds were hydrated. The anterior chamber was flushed with BSS and the eye was inflated to physiologic pressure. 0.9ml of Vigamox was placed in the anterior chamber. The wounds were found to be water tight. The eye was dressed with Combigan. The patient was given protective glasses to wear throughout the day and a shield with which to sleep tonight. The patient was also given drops with which to begin a drop regimen today and will  follow-up with me in one day. Implant Name Type Inv. Item Serial No. Manufacturer Lot No. LRB No. Used Action  LENS IOL DIOP 20.5 - [id number] Intraocular Lens LENS IOL DIOP 20.5 [id number] [place]  Right 1 Implanted   Procedure(s) with comments: CATARACT EXTRACTION PHACO AND INTRAOCULAR LENS PLACEMENT (IOC) RIGHT DIABETIC 2.83 00:35.8 (Right) - Diabetic - insulin and oral meds  Electronically signed: Birder Robson 05/30/2020 7:50 AM

## 2020-05-30 NOTE — Anesthesia Preprocedure Evaluation (Signed)
Anesthesia Evaluation  Patient identified by MRN, date of birth, ID band Patient awake    Reviewed: Allergy & Precautions, NPO status , Patient's Chart, lab work & pertinent test results, reviewed documented beta blocker date and time   History of Anesthesia Complications Negative for: history of anesthetic complications  Airway Mallampati: II  TM Distance: >3 FB Neck ROM: Full    Dental  (+)    Pulmonary former smoker,    breath sounds clear to auscultation       Cardiovascular hypertension, (-) angina(-) DOE + dysrhythmias (On Eliquis) Atrial Fibrillation  Rhythm:Regular Rate:Normal   HLD   Neuro/Psych PSYCHIATRIC DISORDERS Anxiety Depression  Chronic pain s/p intrathecal pump  Neuromuscular disease (Carpal tunnel, peripheral neuropathy)    GI/Hepatic GERD  ,  Endo/Other  diabetesHypothyroidism   Renal/GU      Musculoskeletal  (+) Arthritis ,   Abdominal (+) + obese (BMI 30),   Peds  Hematology   Anesthesia Other Findings Oropharyngeal cancer  Reproductive/Obstetrics                             Anesthesia Physical Anesthesia Plan  ASA: III  Anesthesia Plan: MAC   Post-op Pain Management:    Induction: Intravenous  PONV Risk Score and Plan: 1 and TIVA, Midazolam and Treatment may vary due to age or medical condition  Airway Management Planned: Nasal Cannula  Additional Equipment:   Intra-op Plan:   Post-operative Plan:   Informed Consent: I have reviewed the patients History and Physical, chart, labs and discussed the procedure including the risks, benefits and alternatives for the proposed anesthesia with the patient or authorized representative who has indicated his/her understanding and acceptance.       Plan Discussed with: CRNA and Anesthesiologist  Anesthesia Plan Comments:         Anesthesia Quick Evaluation

## 2020-05-30 NOTE — Anesthesia Postprocedure Evaluation (Signed)
Anesthesia Post Note  Patient: Stephen Yu  Procedure(s) Performed: CATARACT EXTRACTION PHACO AND INTRAOCULAR LENS PLACEMENT (IOC) RIGHT DIABETIC 2.83 00:35.8 (Right Eye)     Patient location during evaluation: PACU Anesthesia Type: MAC Level of consciousness: awake and alert Pain management: pain level controlled Vital Signs Assessment: post-procedure vital signs reviewed and stable Respiratory status: spontaneous breathing, nonlabored ventilation, respiratory function stable and patient connected to nasal cannula oxygen Cardiovascular status: stable and blood pressure returned to baseline Postop Assessment: no apparent nausea or vomiting Anesthetic complications: no   No complications documented.  Kristena Wilhelmi A  Annet Manukyan

## 2020-05-30 NOTE — Transfer of Care (Signed)
Immediate Anesthesia Transfer of Care Note  Patient: Stephen Yu  Procedure(s) Performed: CATARACT EXTRACTION PHACO AND INTRAOCULAR LENS PLACEMENT (IOC) RIGHT DIABETIC 2.83 00:35.8 (Right Eye)  Patient Location: PACU  Anesthesia Type: MAC  Level of Consciousness: awake, alert  and patient cooperative  Airway and Oxygen Therapy: Patient Spontanous Breathing and Patient connected to supplemental oxygen  Post-op Assessment: Post-op Vital signs reviewed, Patient's Cardiovascular Status Stable, Respiratory Function Stable, Patent Airway and No signs of Nausea or vomiting  Post-op Vital Signs: Reviewed and stable  Complications: No complications documented.

## 2020-05-30 NOTE — H&P (Signed)
All labs reviewed. Abnormal studies sent to patients PCP when indicated.  Previous H&P reviewed, patient examined, there are NO CHANGES.  Radwan Cowley Porfilio6/29/20217:23 AM

## 2020-05-30 NOTE — Anesthesia Procedure Notes (Signed)
Procedure Name: MAC Date/Time: 05/30/2020 7:27 AM Performed by: Cameron Ali, CRNA Pre-anesthesia Checklist: Patient identified, Emergency Drugs available, Suction available, Timeout performed and Patient being monitored Patient Re-evaluated:Patient Re-evaluated prior to induction Oxygen Delivery Method: Nasal cannula Placement Confirmation: positive ETCO2

## 2020-05-31 ENCOUNTER — Encounter: Payer: Self-pay | Admitting: Ophthalmology

## 2020-07-17 ENCOUNTER — Other Ambulatory Visit: Payer: Self-pay | Admitting: Unknown Physician Specialty

## 2020-07-17 DIAGNOSIS — R221 Localized swelling, mass and lump, neck: Secondary | ICD-10-CM

## 2020-07-21 ENCOUNTER — Ambulatory Visit: Payer: Federal, State, Local not specified - PPO

## 2020-08-04 ENCOUNTER — Ambulatory Visit
Admission: RE | Admit: 2020-08-04 | Discharge: 2020-08-04 | Disposition: A | Discharge status: Home / Self Care | Payer: Federal, State, Local not specified - PPO | Source: Ambulatory Visit | Attending: Unknown Physician Specialty | Admitting: Unknown Physician Specialty

## 2020-08-04 ENCOUNTER — Other Ambulatory Visit: Payer: Self-pay

## 2020-08-04 DIAGNOSIS — R221 Localized swelling, mass and lump, neck: Secondary | ICD-10-CM

## 2020-09-20 ENCOUNTER — Other Ambulatory Visit: Payer: Self-pay

## 2020-09-20 ENCOUNTER — Encounter
Admission: RE | Admit: 2020-09-20 | Discharge: 2020-09-20 | Disposition: A | Discharge status: Home / Self Care | Payer: Federal, State, Local not specified - PPO | Source: Ambulatory Visit | Attending: Orthopedic Surgery | Admitting: Orthopedic Surgery

## 2020-09-20 DIAGNOSIS — Z01818 Encounter for other preprocedural examination: Secondary | ICD-10-CM | POA: Diagnosis present

## 2020-09-20 NOTE — Patient Instructions (Addendum)
Your procedure is scheduled on: Monday 09/25/20.  Report to DAY SURGERY DEPARTMENT LOCATED ON 2ND FLOOR MEDICAL MALL ENTRANCE. To find out your arrival time please call [phone] between 1PM - 3PM on Friday 09/22/20.   Remember: Instructions that are not followed completely may result in serious medical risk, up to and including death, or upon the discretion of your surgeon and anesthesiologist your surgery may need to be rescheduled.     __X__ 1. Do not eat food after midnight the night before your procedure.                 No gum chewing or hard candies. You may drink SUGAR FREE clear liquids up to 2 hours                 before you are scheduled to arrive for your surgery- DO NOT drink clear                 liquids within 2 hours of the start of your surgery.                  __X__2.  On the morning of surgery brush your teeth with toothpaste and water, you may rinse your mouth with mouthwash if you wish.  Do not swallow any toothpaste or mouthwash.    __X__ 3.  No Alcohol for 24 hours before or after surgery.  __X__ 4.  Do Not Smoke or use e-cigarettes For 24 Hours Prior to Your Surgery.                 Do not use any chewable tobacco products for at least 6 hours prior to                 surgery.  __X__5.  Notify your doctor if there is any change in your medical condition      (cold, fever, infections).      Do NOT wear jewelry, make-up, hairpins, clips or nail polish. Do NOT wear lotions, powders, or perfumes.  Do NOT shave 48 hours prior to surgery. Men may shave face and neck. Do NOT bring valuables to the hospital.     [facility] is not responsible for any belongings or valuables.   Contacts, dentures/partials or body piercings may not be worn into surgery. Bring a case for your contacts, glasses or hearing aids, a denture cup will be supplied.    Patients discharged the day of surgery will not be allowed to drive home.     __X__ Take these medicines the  morning of surgery with A SIP OF WATER:     1. citalopram (CELEXA)  2. diltiazem (CARDIZEM CD)   3. fluticasone (FLONASE)  4. pregabalin (LYRICA)  5. rosuvastatin (CRESTOR)  6. oxymetazoline (AFRIN) if needed    __X__ Use CHG Soap as directed  __X__ Stop Metformin 2 days prior to surgery. Your last dose will be on Friday evening 09/22/20.   __X__ Take 1/2 of usual insulin dose the night before surgery. No insulin the morning of surgery. You will only take 27 units of BASAGLAR on Sunday night. (The night before your surgery)  __X__ Stop Blood Thinners: Eliquis. Your last dose will be on Thursday evening 09/21/20.  __X__ Stop Anti-inflammatories 7 days before surgery such as Advil, Ibuprofen, Motrin, BC or Goodies Powder, Naprosyn, Naproxen, Aleve, Aspirin, Meloxicam. May take Tylenol if needed for pain or discomfort.   __X__Do not start taking any new herbal supplements or  vitamins prior to your procedure.  __X__ Stop the following herbal supplements or vitamins: vitamin E (VITAMIN E)   Wear comfortable clothing (specific to your surgery type) to the hospital.  Plan for stool softeners for home use; pain medications have a tendency to cause constipation. You can also help prevent constipation by eating foods high in fiber such as fruits and vegetables and drinking plenty of fluids as your diet allows.  After surgery, you can prevent lung complications by doing breathing exercises.Take deep breaths and cough every 1-2 hours. Your doctor may order a device called an Incentive Spirometer to help you take deep breaths.  Please call the [place] Department at [phone] if you have any questions about these instruction.

## 2020-09-21 ENCOUNTER — Other Ambulatory Visit
Admission: RE | Admit: 2020-09-21 | Discharge: 2020-09-21 | Disposition: A | Discharge status: Home / Self Care | Payer: Federal, State, Local not specified - PPO | Source: Ambulatory Visit | Attending: Orthopedic Surgery | Admitting: Orthopedic Surgery

## 2020-09-21 DIAGNOSIS — Z20822 Contact with and (suspected) exposure to covid-19: Secondary | ICD-10-CM | POA: Insufficient documentation

## 2020-09-21 DIAGNOSIS — Z01818 Encounter for other preprocedural examination: Secondary | ICD-10-CM | POA: Insufficient documentation

## 2020-09-21 LAB — CBC
HCT: 46.9 % (ref 39.0–52.0)
Hemoglobin: 16.3 g/dL (ref 13.0–17.0)
MCH: 30.5 pg (ref 26.0–34.0)
MCHC: 34.8 g/dL (ref 30.0–36.0)
MCV: 87.7 fL (ref 80.0–100.0)
Platelets: 162 10*3/uL (ref 150–400)
RBC: 5.35 MIL/uL (ref 4.22–5.81)
RDW: 13.9 % (ref 11.5–15.5)
WBC: 6 10*3/uL (ref 4.0–10.5)
nRBC: 0 % (ref 0.0–0.2)

## 2020-09-22 LAB — SARS CORONAVIRUS 2 (TAT 6-24 HRS): SARS Coronavirus 2: NEGATIVE

## 2020-09-25 ENCOUNTER — Ambulatory Visit: Payer: Federal, State, Local not specified - PPO | Admitting: Urgent Care

## 2020-09-25 ENCOUNTER — Ambulatory Visit
Admission: RE | Admit: 2020-09-25 | Discharge: 2020-09-25 | Disposition: A | Discharge status: Home / Self Care | Payer: Federal, State, Local not specified - PPO | Attending: Orthopedic Surgery | Admitting: Orthopedic Surgery

## 2020-09-25 ENCOUNTER — Encounter: Payer: Self-pay | Admitting: Orthopedic Surgery

## 2020-09-25 ENCOUNTER — Other Ambulatory Visit: Payer: Self-pay

## 2020-09-25 ENCOUNTER — Encounter
Admission: RE | Disposition: A | Discharge status: Home / Self Care | Payer: Self-pay | Source: Home / Self Care | Attending: Orthopedic Surgery

## 2020-09-25 DIAGNOSIS — Z881 Allergy status to other antibiotic agents status: Secondary | ICD-10-CM | POA: Insufficient documentation

## 2020-09-25 DIAGNOSIS — K219 Gastro-esophageal reflux disease without esophagitis: Secondary | ICD-10-CM | POA: Insufficient documentation

## 2020-09-25 DIAGNOSIS — M65341 Trigger finger, right ring finger: Secondary | ICD-10-CM | POA: Insufficient documentation

## 2020-09-25 DIAGNOSIS — R42 Dizziness and giddiness: Secondary | ICD-10-CM | POA: Insufficient documentation

## 2020-09-25 DIAGNOSIS — R682 Dry mouth, unspecified: Secondary | ICD-10-CM | POA: Diagnosis not present

## 2020-09-25 DIAGNOSIS — Z7901 Long term (current) use of anticoagulants: Secondary | ICD-10-CM | POA: Insufficient documentation

## 2020-09-25 DIAGNOSIS — M1990 Unspecified osteoarthritis, unspecified site: Secondary | ICD-10-CM | POA: Insufficient documentation

## 2020-09-25 DIAGNOSIS — E785 Hyperlipidemia, unspecified: Secondary | ICD-10-CM | POA: Insufficient documentation

## 2020-09-25 DIAGNOSIS — Z86010 Personal history of colonic polyps: Secondary | ICD-10-CM | POA: Insufficient documentation

## 2020-09-25 DIAGNOSIS — Z79899 Other long term (current) drug therapy: Secondary | ICD-10-CM | POA: Diagnosis not present

## 2020-09-25 DIAGNOSIS — F419 Anxiety disorder, unspecified: Secondary | ICD-10-CM | POA: Insufficient documentation

## 2020-09-25 DIAGNOSIS — M542 Cervicalgia: Secondary | ICD-10-CM | POA: Insufficient documentation

## 2020-09-25 DIAGNOSIS — E039 Hypothyroidism, unspecified: Secondary | ICD-10-CM | POA: Insufficient documentation

## 2020-09-25 DIAGNOSIS — Z833 Family history of diabetes mellitus: Secondary | ICD-10-CM | POA: Insufficient documentation

## 2020-09-25 DIAGNOSIS — G5603 Carpal tunnel syndrome, bilateral upper limbs: Secondary | ICD-10-CM | POA: Insufficient documentation

## 2020-09-25 DIAGNOSIS — Z794 Long term (current) use of insulin: Secondary | ICD-10-CM | POA: Insufficient documentation

## 2020-09-25 DIAGNOSIS — I4891 Unspecified atrial fibrillation: Secondary | ICD-10-CM | POA: Insufficient documentation

## 2020-09-25 DIAGNOSIS — I480 Paroxysmal atrial fibrillation: Secondary | ICD-10-CM | POA: Diagnosis not present

## 2020-09-25 DIAGNOSIS — M549 Dorsalgia, unspecified: Secondary | ICD-10-CM | POA: Diagnosis not present

## 2020-09-25 DIAGNOSIS — Z923 Personal history of irradiation: Secondary | ICD-10-CM | POA: Diagnosis not present

## 2020-09-25 DIAGNOSIS — F32A Depression, unspecified: Secondary | ICD-10-CM | POA: Diagnosis not present

## 2020-09-25 DIAGNOSIS — E1140 Type 2 diabetes mellitus with diabetic neuropathy, unspecified: Secondary | ICD-10-CM | POA: Insufficient documentation

## 2020-09-25 DIAGNOSIS — Z85818 Personal history of malignant neoplasm of other sites of lip, oral cavity, and pharynx: Secondary | ICD-10-CM | POA: Insufficient documentation

## 2020-09-25 DIAGNOSIS — M5126 Other intervertebral disc displacement, lumbar region: Secondary | ICD-10-CM | POA: Diagnosis not present

## 2020-09-25 DIAGNOSIS — Z880 Allergy status to penicillin: Secondary | ICD-10-CM | POA: Insufficient documentation

## 2020-09-25 DIAGNOSIS — Z9889 Other specified postprocedural states: Secondary | ICD-10-CM

## 2020-09-25 DIAGNOSIS — Z87891 Personal history of nicotine dependence: Secondary | ICD-10-CM | POA: Insufficient documentation

## 2020-09-25 DIAGNOSIS — Z8249 Family history of ischemic heart disease and other diseases of the circulatory system: Secondary | ICD-10-CM | POA: Insufficient documentation

## 2020-09-25 DIAGNOSIS — Z9221 Personal history of antineoplastic chemotherapy: Secondary | ICD-10-CM | POA: Insufficient documentation

## 2020-09-25 DIAGNOSIS — I10 Essential (primary) hypertension: Secondary | ICD-10-CM | POA: Insufficient documentation

## 2020-09-25 HISTORY — PX: CARPAL TUNNEL RELEASE: SHX101

## 2020-09-25 LAB — GLUCOSE, CAPILLARY
Glucose-Capillary: 212 mg/dL — ABNORMAL HIGH (ref 70–99)
Glucose-Capillary: 173 mg/dL — ABNORMAL HIGH (ref 70–99)

## 2020-09-25 SURGERY — CARPAL TUNNEL RELEASE
Anesthesia: General | Site: Hand | Laterality: Left

## 2020-09-25 MED ORDER — ESMOLOL HCL 100 MG/10ML IV SOLN
INTRAVENOUS | Status: AC
  Filled 2020-09-25: qty 10

## 2020-09-25 MED ORDER — ORAL CARE MOUTH RINSE: 15.0000 mL | Freq: Once | OROMUCOSAL | Status: AC

## 2020-09-25 MED ORDER — BUPIVACAINE HCL (PF) 0.25 % IJ SOLN
INTRAMUSCULAR | Status: DC | PRN
  Administered 2020-09-25: 10 mL

## 2020-09-25 MED ORDER — FENTANYL CITRATE (PF) 100 MCG/2ML IJ SOLN
INTRAMUSCULAR | Status: AC
  Filled 2020-09-25: qty 2

## 2020-09-25 MED ORDER — MIDAZOLAM HCL 2 MG/2ML IJ SOLN
INTRAMUSCULAR | Status: DC | PRN
  Administered 2020-09-25: 2 mg via INTRAVENOUS

## 2020-09-25 MED ORDER — ESMOLOL HCL 100 MG/10ML IV SOLN
INTRAVENOUS | Status: DC | PRN
  Administered 2020-09-25: 10 mg via INTRAVENOUS

## 2020-09-25 MED ORDER — VASOPRESSIN 20 UNIT/ML IV SOLN
INTRAVENOUS | Status: DC | PRN
  Administered 2020-09-25 (×2): 1 [IU] via INTRAVENOUS

## 2020-09-25 MED ORDER — NEOMYCIN-POLYMYXIN B GU 40-200000 IR SOLN
Status: AC
  Filled 2020-09-25: qty 2

## 2020-09-25 MED ORDER — ACETAMINOPHEN 10 MG/ML IV SOLN
INTRAVENOUS | Status: AC
  Filled 2020-09-25: qty 100

## 2020-09-25 MED ORDER — SODIUM CHLORIDE 0.9 % IV SOLN: INTRAVENOUS | Status: DC

## 2020-09-25 MED ORDER — CELECOXIB 200 MG PO CAPS: 400.0000 mg | ORAL_CAPSULE | Freq: Once | ORAL | Status: AC

## 2020-09-25 MED ORDER — ONDANSETRON HCL 4 MG/2ML IJ SOLN
INTRAMUSCULAR | Status: DC | PRN
  Administered 2020-09-25: 4 mg via INTRAVENOUS

## 2020-09-25 MED ORDER — PROPOFOL 10 MG/ML IV BOLUS
INTRAVENOUS | Status: AC
  Filled 2020-09-25: qty 40

## 2020-09-25 MED ORDER — OXYCODONE HCL 5 MG PO TABS
ORAL_TABLET | ORAL | Status: AC
  Administered 2020-09-25: 5 mg via ORAL
  Filled 2020-09-25: qty 1

## 2020-09-25 MED ORDER — BUPIVACAINE HCL (PF) 0.25 % IJ SOLN
INTRAMUSCULAR | Status: AC
  Filled 2020-09-25: qty 30

## 2020-09-25 MED ORDER — FENTANYL CITRATE (PF) 100 MCG/2ML IJ SOLN
25.0000 ug | INTRAMUSCULAR | Status: DC | PRN
  Administered 2020-09-25: 50 ug via INTRAVENOUS

## 2020-09-25 MED ORDER — CELECOXIB 200 MG PO CAPS
ORAL_CAPSULE | ORAL | Status: AC
  Administered 2020-09-25: 400 mg via ORAL
  Filled 2020-09-25: qty 2

## 2020-09-25 MED ORDER — FAMOTIDINE 20 MG PO TABS: 20.0000 mg | ORAL_TABLET | Freq: Once | ORAL | Status: AC

## 2020-09-25 MED ORDER — FENTANYL CITRATE (PF) 100 MCG/2ML IJ SOLN
INTRAMUSCULAR | Status: DC | PRN
  Administered 2020-09-25: 100 ug via INTRAVENOUS
  Administered 2020-09-25: 50 ug via INTRAVENOUS

## 2020-09-25 MED ORDER — ACETAMINOPHEN 10 MG/ML IV SOLN: 1000.0000 mg | Freq: Once | INTRAVENOUS | Status: DC | PRN

## 2020-09-25 MED ORDER — DEXAMETHASONE SODIUM PHOSPHATE 10 MG/ML IJ SOLN
INTRAMUSCULAR | Status: DC | PRN
  Administered 2020-09-25: 4 mg via INTRAVENOUS

## 2020-09-25 MED ORDER — ACETAMINOPHEN 10 MG/ML IV SOLN
INTRAVENOUS | Status: DC | PRN
  Administered 2020-09-25: 1000 mg via INTRAVENOUS

## 2020-09-25 MED ORDER — KETAMINE HCL 50 MG/ML IJ SOLN
INTRAMUSCULAR | Status: AC
  Filled 2020-09-25: qty 10

## 2020-09-25 MED ORDER — PHENYLEPHRINE HCL (PRESSORS) 10 MG/ML IV SOLN
INTRAVENOUS | Status: DC | PRN
  Administered 2020-09-25 (×2): 200 ug via INTRAVENOUS

## 2020-09-25 MED ORDER — MIDAZOLAM HCL 2 MG/2ML IJ SOLN
INTRAMUSCULAR | Status: AC
  Filled 2020-09-25: qty 2

## 2020-09-25 MED ORDER — OXYCODONE HCL 5 MG/5ML PO SOLN: 5.0000 mg | Freq: Once | ORAL | Status: AC | PRN

## 2020-09-25 MED ORDER — GLYCOPYRROLATE 0.2 MG/ML IJ SOLN
INTRAMUSCULAR | Status: DC | PRN
  Administered 2020-09-25: .2 mg via INTRAVENOUS

## 2020-09-25 MED ORDER — FENTANYL CITRATE (PF) 100 MCG/2ML IJ SOLN
INTRAMUSCULAR | Status: AC
  Administered 2020-09-25: 50 ug via INTRAVENOUS
  Filled 2020-09-25: qty 2

## 2020-09-25 MED ORDER — CHLORHEXIDINE GLUCONATE 0.12 % MT SOLN
OROMUCOSAL | Status: AC
  Administered 2020-09-25: 15 mL via OROMUCOSAL
  Filled 2020-09-25: qty 15

## 2020-09-25 MED ORDER — OXYCODONE HCL 5 MG PO TABS: 5.0000 mg | ORAL_TABLET | Freq: Once | ORAL | Status: AC | PRN

## 2020-09-25 MED ORDER — DILTIAZEM HCL ER COATED BEADS 120 MG PO TB24
120.0000 mg | ORAL_TABLET | Freq: Once | ORAL | Status: AC
  Administered 2020-09-25: 120 mg via ORAL
  Filled 2020-09-25: qty 1

## 2020-09-25 MED ORDER — FAMOTIDINE 20 MG PO TABS
ORAL_TABLET | ORAL | Status: AC
  Administered 2020-09-25: 20 mg via ORAL
  Filled 2020-09-25: qty 1

## 2020-09-25 MED ORDER — LACTATED RINGERS IV SOLN: INTRAVENOUS | Status: DC | PRN

## 2020-09-25 MED ORDER — CHLORHEXIDINE GLUCONATE 0.12 % MT SOLN: 15.0000 mL | Freq: Once | OROMUCOSAL | Status: AC

## 2020-09-25 MED ORDER — PHENYLEPHRINE HCL-NACL 10-0.9 MG/250ML-% IV SOLN
INTRAVENOUS | Status: DC | PRN
  Administered 2020-09-25: 75 ug/min via INTRAVENOUS

## 2020-09-25 MED ORDER — OXYCODONE HCL 5 MG PO TABS: 5.0000 mg | ORAL_TABLET | Freq: Once | ORAL | Status: AC

## 2020-09-25 MED ORDER — NEOMYCIN-POLYMYXIN B GU 40-200000 IR SOLN
Status: DC | PRN
  Administered 2020-09-25: 2 mL

## 2020-09-25 MED ORDER — PROPOFOL 10 MG/ML IV BOLUS
INTRAVENOUS | Status: DC | PRN
  Administered 2020-09-25: 200 mg via INTRAVENOUS

## 2020-09-25 MED ORDER — KETAMINE HCL 50 MG/ML IJ SOLN
INTRAMUSCULAR | Status: DC | PRN
  Administered 2020-09-25: 50 mg via INTRAMUSCULAR

## 2020-09-25 MED ORDER — LIDOCAINE HCL (CARDIAC) PF 100 MG/5ML IV SOSY
PREFILLED_SYRINGE | INTRAVENOUS | Status: DC | PRN
  Administered 2020-09-25: 100 mg via INTRAVENOUS

## 2020-09-25 MED ORDER — ONDANSETRON HCL 4 MG/2ML IJ SOLN: 4.0000 mg | Freq: Once | INTRAMUSCULAR | Status: DC | PRN

## 2020-09-25 MED ORDER — FENTANYL CITRATE (PF) 250 MCG/5ML IJ SOLN
INTRAMUSCULAR | Status: AC
  Filled 2020-09-25: qty 5

## 2020-09-25 SURGICAL SUPPLY — 28 items
BNDG CMPR STD VLCR NS LF 5.8X3 (GAUZE/BANDAGES/DRESSINGS) ×1
BNDG ELASTIC 3X5.8 VLCR NS LF (GAUZE/BANDAGES/DRESSINGS) ×3 IMPLANT
BNDG ESMARK 4X12 TAN STRL LF (GAUZE/BANDAGES/DRESSINGS) ×3 IMPLANT
CANISTER SUCT 1200ML W/VALVE (MISCELLANEOUS) ×3 IMPLANT
CAST PADDING 3X4FT ST 30246 (SOFTGOODS) ×2
COVER WAND RF STERILE (DRAPES) ×3 IMPLANT
CUFF TOURN SGL QUICK 18X4 (TOURNIQUET CUFF) ×3 IMPLANT
DRSG DERMACEA 8X12 NADH (GAUZE/BANDAGES/DRESSINGS) ×3 IMPLANT
DURAPREP 26ML APPLICATOR (WOUND CARE) ×3 IMPLANT
ELECT CAUTERY BLADE 6.4 (BLADE) ×3 IMPLANT
ELECT REM PT RETURN 9FT ADLT (ELECTROSURGICAL) ×3
ELECTRODE REM PT RTRN 9FT ADLT (ELECTROSURGICAL) ×1 IMPLANT
GAUZE SPONGE 4X4 12PLY STRL (GAUZE/BANDAGES/DRESSINGS) ×3 IMPLANT
GLOVE BIOGEL M STRL SZ7.5 (GLOVE) ×3 IMPLANT
GLOVE INDICATOR 8.0 STRL GRN (GLOVE) ×3 IMPLANT
GOWN STRL REUS W/ TWL LRG LVL3 (GOWN DISPOSABLE) ×2 IMPLANT
GOWN STRL REUS W/TWL LRG LVL3 (GOWN DISPOSABLE) ×6
KIT TURNOVER KIT A (KITS) ×3 IMPLANT
NS IRRIG 500ML POUR BTL (IV SOLUTION) ×3 IMPLANT
PACK EXTREMITY (MISCELLANEOUS) ×3 IMPLANT
PAD CAST CTTN 3X4 STRL (SOFTGOODS) ×1 IMPLANT
PADDING CAST COTTON 3X4 STRL (SOFTGOODS) ×1
SOL PREP PVP 2OZ (MISCELLANEOUS) ×3
SOLUTION PREP PVP 2OZ (MISCELLANEOUS) ×1 IMPLANT
SPLINT CAST 1 STEP 3X12 (MISCELLANEOUS) ×3 IMPLANT
STOCKINETTE 48X4 2 PLY STRL (GAUZE/BANDAGES/DRESSINGS) ×1 IMPLANT
STOCKINETTE STRL 4IN 9604848 (GAUZE/BANDAGES/DRESSINGS) ×3 IMPLANT
SUT ETHILON 5-0 FS-2 18 BLK (SUTURE) ×3 IMPLANT

## 2020-09-25 NOTE — Progress Notes (Signed)
Pt HR: 96-123. Pt given PO Cardizem. Pt pain is 6/10 after 154mcg Fentanyl. Dr. Bertell Maria states HR ok for pt to go home. Orders received from Dr. Bertell Maria for pain medication.

## 2020-09-25 NOTE — Transfer of Care (Addendum)
Immediate Anesthesia Transfer of Care Note  Patient: Stephen Yu  Procedure(s) Performed: CARPAL TUNNEL RELEASE (Left Hand)  Patient Location: PACU  Anesthesia Type:General  Level of Consciousness: awake, alert  and patient cooperative  Airway & Oxygen Therapy: Patient Spontanous Breathing and Patient connected to face mask oxygen  Post-op Assessment: Report given to RN, Post -op Vital signs reviewed and stable and Patient moving all extremities  Post vital signs: Reviewed and stable  Last Vitals:  Vitals Value Taken Time  BP 125/66 09/25/20 1738  Temp    Pulse 100 09/25/20 1741  Resp 13 09/25/20 1741  SpO2 95 % 09/25/20 1741  Vitals shown include unvalidated device data.  Last Pain:  Vitals:   09/25/20 1737  TempSrc:   PainSc: (P) Asleep         Complications: No complications documented.

## 2020-09-25 NOTE — Anesthesia Procedure Notes (Signed)
Procedure Name: LMA Insertion Date/Time: 09/25/2020 4:28 PM Performed by: Lowry Bowl, CRNA Pre-anesthesia Checklist: Patient identified, Emergency Drugs available, Suction available and Patient being monitored Patient Re-evaluated:Patient Re-evaluated prior to induction Oxygen Delivery Method: Circle system utilized Preoxygenation: Pre-oxygenation with 100% oxygen Induction Type: IV induction LMA: LMA inserted LMA Size: 5.0 Number of attempts: 1 Placement Confirmation: positive ETCO2 and breath sounds checked- equal and bilateral Tube secured with: Tape Dental Injury: Teeth and Oropharynx as per pre-operative assessment

## 2020-09-25 NOTE — Anesthesia Postprocedure Evaluation (Signed)
Anesthesia Post Note  Patient: Stephen Yu  Procedure(s) Performed: CARPAL TUNNEL RELEASE (Left Hand)  Patient location during evaluation: PACU Anesthesia Type: General Level of consciousness: awake and alert Pain management: pain level controlled Vital Signs Assessment: post-procedure vital signs reviewed and stable Respiratory status: spontaneous breathing, nonlabored ventilation, respiratory function stable and patient connected to nasal cannula oxygen Cardiovascular status: blood pressure returned to baseline and stable Postop Assessment: no apparent nausea or vomiting Anesthetic complications: no   No complications documented.   Last Vitals:  Vitals:   09/25/20 1837 09/25/20 1852  BP: (!) 107/41 110/74  Pulse: (!) 104 (!) 119  Resp: 19 16  Temp:  36.7 C  SpO2: 95% 96%    Last Pain:  Vitals:   09/25/20 1852  TempSrc:   PainSc: 6                  Arita Miss

## 2020-09-25 NOTE — Discharge Instructions (Signed)
AMBULATORY SURGERY  °DISCHARGE INSTRUCTIONS ° ° °1) The drugs that you were given will stay in your system until tomorrow so for the next 24 hours you should not: ° °A) Drive an automobile °B) Make any legal decisions °C) Drink any alcoholic beverage ° ° °2) You may resume regular meals tomorrow.  Today it is better to start with liquids and gradually work up to solid foods. ° °You may eat anything you prefer, but it is better to start with liquids, then soup and crackers, and gradually work up to solid foods. ° ° °3) Please notify your doctor immediately if you have any unusual bleeding, trouble breathing, redness and pain at the surgery site, drainage, fever, or pain not relieved by medication. ° ° ° °4) Additional Instructions: ° ° ° ° ° ° ° °Please contact your physician with any problems or Same Day Surgery at [phone], Monday through Friday 6 am to 4 pm, or [facility] [place] Main number at [phone]. °

## 2020-09-25 NOTE — Op Note (Signed)
OPERATIVE NOTE  DATE OF SURGERY:  09/25/2020  PATIENT NAME:  Stephen Yu   DOB: [birth year]  MRN: [id number]  PRE-OPERATIVE DIAGNOSIS: Left carpal tunnel syndrome  POST-OPERATIVE DIAGNOSIS:  Same  PROCEDURE:  Left carpal tunnel release  SURGEON:  Marciano Sequin. M.D.  ANESTHESIA: general  ESTIMATED BLOOD LOSS: Minimal  FLUIDS REPLACED: 1300 mL of crystalloid  TOURNIQUET TIME: 33 minutes  DRAINS: None  INDICATIONS FOR SURGERY: Stephen Yu is a 64 y.o. year old male with a long history of numbness and paresthesias to the left hand. EMG/nerve conduction studies demonstrated findings consistent with carpal tunnel syndrome.The patient had not seen any significant improvement despite conservative nonsurgical intervention. After discussion of the risks and benefits of surgical intervention, the patient expressed understanding of the risks benefits and agree with plans for carpal tunnel release.   PROCEDURE IN DETAIL: The patient was brought into the operating room and after adequate general anesthesia, a tourniquet was placed on the patient's left upper arm.The left hand and arm were prepped with alcohol and Duraprep and draped in the usual sterile fashion. A "time-out" was performed as per usual protocol. The hand and forearm were exsanguinated using an Esmarch and the tourniquet was inflated to 250 mmHg. Loupe magnification was used throughout the procedure. An incision was made just ulnar to the thenar palmar crease. Dissection was carried down through the palmar fascia to the transverse carpal ligament. The transverse carpal ligament was sharply incised, taking care to protect the underlying structures with the carpal tunnel. Complete release of the transverse carpal ligament was achieved. There was no evidence of ganglion cyst or lipoma within the carpal tunnel. The wound was irrigated with copious amounts of normal saline with antibiotic solution. The skin was then  re-approximated with interrupted sutures of #5-0 nylon. A sterile dressing was applied followed by application of a volar splint. The tourniquet was deflated with a total tourniquet time of 33 minutes.  The patient tolerated the procedure well and was transported to the PACU in stable condition.  Melanie Openshaw P. Holley Bouche., M.D.

## 2020-09-25 NOTE — Anesthesia Preprocedure Evaluation (Signed)
Anesthesia Evaluation  Patient identified by MRN, date of birth, ID band Patient awake    Reviewed: Allergy & Precautions, NPO status , Patient's Chart, lab work & pertinent test results  History of Anesthesia Complications Negative for: history of anesthetic complications  Airway Mallampati: II  TM Distance: >3 FB Neck ROM: Full    Dental  (+) Poor Dentition, Dental Advisory Given   Pulmonary neg pulmonary ROS, neg sleep apnea, neg COPD, Patient abstained from smoking.Not current smoker, former smoker,    Pulmonary exam normal breath sounds clear to auscultation       Cardiovascular Exercise Tolerance: Good METShypertension, (-) CAD and (-) Past MI + dysrhythmias Atrial Fibrillation  Rhythm:Irregular Rate:Normal - Systolic murmurs    Neuro/Psych PSYCHIATRIC DISORDERS Anxiety Depression In-situ morphine pump for back pain On chronic opioids as well  Neuromuscular disease    GI/Hepatic GERD  Controlled,(+)     (-) substance abuse  ,   Endo/Other  diabetesHypothyroidism   Renal/GU negative Renal ROS     Musculoskeletal  (+) Arthritis , Osteoarthritis,    Abdominal   Peds  Hematology 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; no excessive neck extension restriction, but does have chronic dry mouth   Anesthesia Other Findings Past Medical History: No date: Anxiety No date: Arthritis 08/21/2017: Cancer ([place]) 1996: Chronic pain     Comment:  due to work injury No date: Colon polyp     Comment:  adenomatous polyp ascending colon No date: Cough     Comment:  THAT WONT GO AWAY-PT TO SEE DR Doy Hutching ON 06-10-17 @ 4 PM No date: CTS (carpal tunnel syndrome)     Comment:  bilateral No date: Depression No date: Diabetes mellitus without complication (HCC) No date: Dysrhythmia     Comment:  AFIB X 1 No date: GERD (gastroesophageal reflux disease) No date: Herniated lumbar intervertebral disc No date: Hypertension No date:  Hypothyroidism No date: Neck pain No date: Neuromuscular disorder (HCC) No date: Presence of intrathecal pump     Comment:  Dr. Mechele Dawley in [place], [place] No date: Vertigo     Comment:  Avg: 2x/month  Reproductive/Obstetrics                             Anesthesia Physical Anesthesia Plan  ASA: III  Anesthesia Plan: General   Post-op Pain Management:    Induction: Intravenous  PONV Risk Score and Plan: 2 and Ondansetron, Dexamethasone and Midazolam  Airway Management Planned: LMA  Additional Equipment: None  Intra-op Plan:   Post-operative Plan: Extubation in OR  Informed Consent: I have reviewed the patients History and Physical, chart, labs and discussed the procedure including the risks, benefits and alternatives for the proposed anesthesia with the patient or authorized representative who has indicated his/her understanding and acceptance.     Dental advisory given  Plan Discussed with: CRNA and Surgeon  Anesthesia Plan Comments: (Discussed risks of anesthesia with patient, including PONV, sore throat, lip/dental damage. Rare risks discussed as well, such as cardiorespiratory and neurological sequelae. Patient understands.  Offered patient a peripheral nerve block to help with postoperative pain; patient relatively ambivalent about it. Ultimately we decided to proceed without it and rely on local infiltration by surgeon, intraop ketamine, and usual postop PO medicines. Patient in agreement)        Anesthesia Quick Evaluation

## 2020-09-25 NOTE — H&P (Signed)
The patient has been re-examined, and the chart reviewed, and there have been no interval changes to the documented history and physical.    The risks, benefits, and alternatives have been discussed at length. The patient expressed understanding of the risks benefits and agreed with plans for surgical intervention.  Kassidee Narciso P. Hashim Eichhorst, Jr. M.D.    

## 2020-09-25 NOTE — Progress Notes (Signed)
Pt HR: 90-130's. Dr. Bertell Maria notified. Acknowledged. Orders received.

## 2020-09-25 NOTE — H&P (Signed)
ORTHOPAEDIC HISTORY & PHYSICAL Progress Notes Willson Lipa, Florinda Marker., MD - 09/01/2020 3:00 PM EDT Chief Complaint: Chief Complaint  Patient presents with  . Carpal Tunnel  Bilateral carpal tunnel syndrome and right ring trigger finger   Reason for Visit: The patient is a 64 y.o. right-hand dominant male who presents today for evaluation of his bilateral hands. He reports a long history of numbness and paresthesias to both hands. The left hand is more symptomatic. He apparently had NCV in 2017 and was told he had carpal tunnel syndrome.He has not appreciated any significant improvement despite splinting and activity modification. He is unable to tolerate NSAIDs due to anticoagulation with Eliquis. He has difficulty with buttoning his buttons. He does have nighttime paresthesias.  Medications: Current Outpatient Medications  Medication Sig Dispense Refill  . cholecalciferol, vitamin D3, (VITAMIN D3) 125 mcg (5,000 unit) Tab Take by mouth once daily Dose unknown  . citalopram (CELEXA) 40 MG tablet Take 1 tablet (40 mg total) by mouth once daily 90 tablet 3  . diltiazem (CARDIZEM CD) 120 MG XR capsule Take 1 capsule (120 mg total) by mouth 2 (two) times daily 180 capsule 4  . ELIQUIS 5 mg tablet TAKE 1 TABLET(5 MG) BY MOUTH TWICE DAILY 180 tablet 1  . fluticasone propionate (FLONASE) 50 mcg/actuation nasal spray Place 2 sprays into both nostrils 2 (two) times daily  . FREESTYLE LIBRE 14 DAY reader USE AS DIRECTED 1 each 1  . HYDROcodone-acetaminophen (NORCO) 7.5-325 mg tablet Take 1 tablet by mouth every 6 (six) hours as needed for Pain.  . insulin GLARGINE (BASAGLAR KWIKPEN U-100 INSULIN) pen injector (concentration 100 units/mL) Inject 50 Units subcutaneously once daily 15 mL 5  . lisinopriL (ZESTRIL) 10 MG tablet Take 1 tablet (10 mg total) by mouth once daily 90 tablet 4  . metFORMIN (GLUCOPHAGE) 500 MG tablet TAKE 1 TABLET(500 MG) BY MOUTH TWICE DAILY WITH MEALS 180 tablet 2  . morphine  sulfate/PF (MORPHINE, PF,) pump  . oxymetazoline (AFRIN) 0.05 % nasal spray Place 1 spray into both nostrils once daily  . pen needle, diabetic (BD ULTRA-FINE NANO PEN NEEDLE) 32 gauge x 5/32" Ndle Inject 100 each subcutaneously once daily 100 each 5  . pregabalin (LYRICA) 150 MG capsule Take 150 mg by mouth 2 (two) times daily.  . rosuvastatin (CRESTOR) 10 MG tablet Take 1 tablet (10 mg total) by mouth once daily 90 tablet 3  . semaglutide (OZEMPIC) pen injector Inject 0.75 mLs (1 mg total) subcutaneously once a week for 30 days 3 mL 5  . testosterone cypionate (DEPO-TESTOSTERONE) 200 mg/mL injection INJECT ONE ML INTO THE MUSCLE EVERY 14 DAYS 4 mL 2  . tiZANidine (ZANAFLEX) 4 MG capsule Take 4 mg by mouth every 8 (eight) hours as needed for Muscle spasms.  . traZODone (DESYREL) 50 MG tablet TAKE 1 TABLET BY MOUTH EVERY NIGHT 30 tablet 5  . vit C/E/Zn/coppr/lutein/zeaxan (PRESERVISION AREDS-2 ORAL) Take 1 tablet by mouth 2 (two) times daily  . vitamin E 400 UNIT capsule Take 400 Units by mouth 2 (two) times daily as needed  . FREESTYLE LIBRE 14 DAY SENSOR kit USE AS DIRECTED 1 kit 1   No current facility-administered medications for this visit.   Allergies: Allergies  Allergen Reactions  . Penicillin Hives  . Biaxin [Clarithromycin] Other (See Comments)  Causes hicups  . Cephalexin Other (See Comments)  Hicups   Past Medical History: Past Medical History:  Diagnosis Date  . Anxiety  . 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 Status post chemotherapy. Follows at [place].  . Chronic pain  . Colon polyp  . Depression  . GERD (gastroesophageal reflux disease)  . Hyperlipidemia  . Hyperplastic colon polyp 04/22/2017  . Hypertension  . Hypothyroidism  . 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 Paroxysmal atrial  fibrillation (CMS-HCC)  . Severe carpal tunnel syndrome of both wrists  . Tubular adenoma of colon, unspecified 04/22/2017  . Type 2 diabetes mellitus (CMS-HCC)   Past Surgical History: Past Surgical History:  Procedure Laterality Date  . COLONOSCOPY 04/22/2017  Tubular adenoma of colon/Hyperplastic colon polyp/Repeat 2 to 3 yrs/MUS   Social History: Social History   Socioeconomic History  . Marital status: Married  Spouse name: Hilda Blades  . Number of children: 4  . Years of education: 5  . Highest education level: Not on file  Occupational History  . Occupation: Retired  Tobacco Use  . Smoking status: Former Research scientist (life sciences)  . Smokeless tobacco: Never Used  Vaping Use  . Vaping Use: Never used  Substance and Sexual Activity  . Alcohol use: No  . Drug use: No  . Sexual activity: Defer  Other Topics Concern  . Not on file  Social History Narrative  . Not on file   Social Determinants of Health   Financial Resource Strain:  . Difficulty of Paying Living Expenses:  Food Insecurity:  . Worried About Charity fundraiser in the Last Year:  . Arboriculturist in the Last Year:  Transportation Needs:  . Film/video editor (Medical):  Marland Kitchen Lack of Transportation (Non-Medical):  Physical Activity:  . Days of Exercise per Week:  . Minutes of Exercise per Session:  Stress:  . Feeling of Stress :  Social Connections:  . Frequency of Communication with Friends and Family:  . Frequency of Social Gatherings with Friends and Family:  . Attends Religious Services:  . Active Member of Clubs or Organizations:  . Attends Archivist Meetings:  Marland Kitchen Marital Status:   Family History: Family History  Problem Relation Age of Onset  . High blood pressure (Hypertension) Mother  . Diabetes type II Maternal Grandmother  . High blood pressure (Hypertension) Maternal Grandmother   Review of Systems: A comprehensive 14 point ROS was performed, reviewed, and the pertinent orthopaedic  findings are documented in the HPI.  Exam BP 116/76  Temp 36.6 C (97.9 F)  Ht 177.8 cm (5' 10" )  Wt 97.4 kg (214 lb 12.8 oz)  BMI 30.82 kg/m   General:  Well-developed, well-nourished male seen in no acute distress.   HEENT:  Atraumatic, normocephalic. Pupils are equal and reactive to light. Extraocular motion is intact. Sclera are clear. Oropharynx is clear with moist mucosa.  Neck: Good range of motion. No tenderness to palpation. Spurling`s test is negative.  Lungs:  Clear to auscultation bilaterally.  Cardiovascular:  Regular rate and rhythm. Normal S1, S2. No murmur . No appreciable gallops or rubs. Peripheral pulses are palpable.   Extremities:  Normal shoulder contour.  Good range of motion and stability of the shoulders, elbows, and wrists. Tinel`s test at the elbow is negative.  Right hand:  Tenderness: Negative Erythema: negative Swelling: negative Capillary Refill: normal Thenar atrophy: negative Intrinsic wasting: negative Grip strength: fair to good grip strength Pincer strength: fair to good pincer strength Tinel`s test: positive Phalen`s test: positive  Triggering: Triggering of the ring digits Finkelstein`s test: negative Range of motion: Good range of motion of the digits  Left hand:  Tenderness: Negative Erythema: negative Swelling: negative Capillary Refill: normal Thenar atrophy: negative Intrinsic wasting: negative Grip strength: fair to good grip strength Pincer strength: fair to good pincer strength Tinel`s test: positive Phalen`s test: positive Triggering: No gross triggering or locking of the digits Finkelstein`s test: negative Range of motion: Good range of motion of the digits  Neurologic:  Awake, alert , and oriented.  Sensory function is intact except for decreased discrimination to pinprick and light touch in a median nerve distribution. Motor strength is judged to be 5/5 except as noted above. No clonus or tremor.   Motor coordination is within normal limits.  Impression: Bilateral carpal tunnel syndrome  Plan:  The findings were discussed in detail with the patient. I was unable to review the patient's previous EMG/NCV. Given his diabetic neuropathy and cervical issues, I would like to obtain new EMG/NCVs. The patient was given informational material on carpal tunnel release. Conservative treatment options were reviewed with the patient. We discussed the risks and benefits of surgical intervention. The usual perioperative course was also discussed in detail. The patient expressed understanding of the risks and benefits of surgical intervention and would like to proceed with plans for left carpal tunnel release pending the test results.Marland Kitchen  MEDICAL CLEARANCE: Per anesthesiology ACTIVITIES: As tolerated. WORK STATUS: Not applicable. THERAPY: None MEDICATIONS: Requested Prescriptions   No prescriptions requested or ordered in this encounter   FOLLOW-UP: Return for preop History & Physical pending surgery date.  Shawntia Mangal P. Holley Bouche., M.D.   Electronically signed by Lamar Benes., MD at 09/07/2020 9:41 PM EDT

## 2020-09-26 ENCOUNTER — Encounter: Payer: Self-pay | Admitting: Orthopedic Surgery

## 2020-11-20 NOTE — H&P (Signed)
ORTHOPAEDIC HISTORY & PHYSICAL Progress Notes Sunni Richardson, Florinda Marker., MD - 11/14/2020 1:45 PM EST Chief Complaint: Chief Complaint  Patient presents with  . Post Operative Visit  Left Carpal Tunnel Realease 09/25/20  . Carpal Tunnel  Right carpal tunnel syndrome, discuss surgery   Reason for Visit: The patient is a 64 y.o. right-hand dominant male who presents today for reevaluation of both hands. He is 6 weeks status post left carpal tunnel release. He denies any problems with the surgical incision. He reports less numbness to the left hand since surgery.  As noted previously, the patient has a long history of numbness and paresthesias to both hands. He has not appreciated any significant improvement to the right hand despite splinting and activity modification. He is unable to tolerate NSAIDs due to anticoagulation with Eliquis. He does have nighttime paresthesias. EMG/NCS performed by Dr. Marlaine Hind on 04/07/2017 demonstrated very severe bilateral carpal tunnel syndrome. He would like to proceed with plans for right carpal tunnel release.  Medications: Current Outpatient Medications  Medication Sig Dispense Refill  . BD LUER-LOK SYRINGE 3 mL 23 gauge x 1 1/2" Syrg  . cholecalciferol, vitamin D3, (VITAMIN D3) 125 mcg (5,000 unit) Tab Take 5,000 Units by mouth once daily Dose unknown  . citalopram (CELEXA) 40 MG tablet Take 1 tablet (40 mg total) by mouth once daily 90 tablet 3  . diltiazem (CARDIZEM CD) 120 MG XR capsule Take 1 capsule (120 mg total) by mouth 2 (two) times daily 180 capsule 4  . ELIQUIS 5 mg tablet TAKE 1 TABLET(5 MG) BY MOUTH TWICE DAILY 180 tablet 1  . fluticasone propionate (FLONASE) 50 mcg/actuation nasal spray Place 2 sprays into both nostrils 2 (two) times daily  . FREESTYLE LIBRE 14 DAY reader USE AS DIRECTED 1 each 1  . FREESTYLE LIBRE 14 DAY SENSOR kit USE AS DIRECTED 1 kit 5  . HYDROcodone-acetaminophen (NORCO) 7.5-325 mg tablet Take 1 tablet by mouth every  6 (six) hours as needed for Pain.  . insulin GLARGINE (BASAGLAR KWIKPEN U-100 INSULIN) pen injector (concentration 100 units/mL) Inject 50 Units subcutaneously once daily (Patient taking differently: Inject 60 Units subcutaneously once daily ) 15 mL 5  . lisinopriL (ZESTRIL) 10 MG tablet Take 1 tablet (10 mg total) by mouth once daily 90 tablet 4  . metFORMIN (GLUCOPHAGE) 500 MG tablet TAKE 1 TABLET(500 MG) BY MOUTH TWICE DAILY WITH MEALS 180 tablet 2  . methyl salicylate-menthol 80-32 % cream Apply 1 Application topically 4 (four) times daily as needed  . morphine sulfate/PF (MORPHINE, PF,) pump  . oxymetazoline (AFRIN) 0.05 % nasal spray Place 1 spray into both nostrils once daily  . pen needle, diabetic (BD ULTRA-FINE NANO PEN NEEDLE) 32 gauge x 5/32" Ndle Inject 100 each subcutaneously once daily 100 each 5  . pregabalin (LYRICA) 150 MG capsule Take 150 mg by mouth 2 (two) times daily.  . pseudoephedrine-guaifenesin (MUCINEX D) 60-600 mg XR tablet Take 1 tablet by mouth every 12 (twelve) hours  . rosuvastatin (CRESTOR) 10 MG tablet Take 1 tablet (10 mg total) by mouth once daily 90 tablet 3  . semaglutide 1 mg/dose (4 mg/3 mL) PnIj Inject 1 mg subcutaneously once a week  . testosterone cypionate (DEPO-TESTOSTERONE) 200 mg/mL injection INJECT ONE ML INTO THE MUSCLE EVERY 14 DAYS 4 mL 2  . tiZANidine (ZANAFLEX) 4 MG capsule Take 4 mg by mouth every 8 (eight) hours as needed for Muscle spasms.  . traMADoL (ULTRAM-ER) 300 MG ER tablet Take  300 mg by mouth once daily as needed  . traZODone (DESYREL) 50 MG tablet TAKE 1 TABLET BY MOUTH EVERY NIGHT 30 tablet 5  . vit C/E/Zn/coppr/lutein/zeaxan (PRESERVISION AREDS-2 ORAL) Take 1 tablet by mouth 2 (two) times daily  . vitamin E 400 UNIT capsule Take 400 Units by mouth 2 (two) times daily as needed  . semaglutide (OZEMPIC) pen injector Inject 0.75 mLs (1 mg total) subcutaneously once a week for 30 days 3 mL 5   No current facility-administered  medications for this visit.   Allergies: Allergies  Allergen Reactions  . Penicillin Hives  . Biaxin [Clarithromycin] Other (See Comments)  Causes hicups  . Cephalexin Other (See Comments)  Hicups   Past Medical History: Past Medical History:  Diagnosis Date  . Anxiety  . 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 Status post chemotherapy. Follows at [place] [facility].  . Chronic pain  . Colon polyp  . Depression  . GERD (gastroesophageal reflux disease)  . Hyperlipidemia  . Hyperplastic colon polyp 04/22/2017  . Hypertension  . Hypothyroidism  . 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 Paroxysmal atrial fibrillation (CMS-HCC)  . Severe carpal tunnel syndrome of both wrists  . Tubular adenoma of colon, unspecified 04/22/2017  . Type 2 diabetes mellitus (CMS-HCC)   Past Surgical History: Past Surgical History:  Procedure Laterality Date  . COLONOSCOPY 04/22/2017  Tubular adenoma of colon/Hyperplastic colon polyp/Repeat 2 to 3 yrs/MUS  . Left carpal tunnel release 09/25/2020  Dr Marry Guan   Social History: Social History   Socioeconomic History  . Marital status: Married  Spouse name: Hilda Blades  . Number of children: 4  . Years of education: 78  . Highest education level: Not on file  Occupational History  . Occupation: Retired  Tobacco Use  . Smoking status: Former Research scientist (life sciences)  . Smokeless tobacco: Never Used  Vaping Use  . Vaping Use: Never used  Substance and Sexual Activity  . Alcohol use: No  . Drug use: No  . Sexual activity: Defer  Other Topics Concern  . Not on file  Social History Narrative  . Not on file   Social Determinants of Health   Financial Resource Strain: Not on file  Food Insecurity: Not on file  Transportation Needs: Not on file  Physical Activity: Not on file  Stress: Not on file  Social  Connections: Not on file  Housing Stability: Not on file   Family History: Family History  Problem Relation Age of Onset  . High blood pressure (Hypertension) Mother  . Diabetes type II Maternal Grandmother  . High blood pressure (Hypertension) Maternal Grandmother   Review of Systems: A comprehensive 14 point ROS was performed, reviewed, and the pertinent orthopaedic findings are documented in the HPI.  Exam BP 132/82  Temp 36.7 C (98.1 F)  Ht 177.8 cm (5' 10" )  Wt (!) 100.8 kg (222 lb 3.2 oz)  BMI 31.88 kg/m   General:  Well-developed, well-nourished male seen in no acute distress.   HEENT:  Atraumatic, normocephalic. Pupils are equal and reactive to light. Extraocular motion is intact. Sclera are clear. Oropharynx is clear with moist mucosa.  Neck: Good range of motion. No tenderness to palpation. Spurling`s test is negative.  Lungs:  Clear to auscultation bilaterally.  Cardiovascular:  Regular rate and rhythm. Normal S1, S2. No murmur . No appreciable gallops or rubs.  Peripheral pulses are palpable.   Extremities:  Normal shoulder contour.  Good range of motion and stability of the shoulders, elbows, and wrists. Tinel`s test at the elbows is negative.  Right hand:  Tenderness: Negative Erythema: negative Swelling: negative Capillary Refill: normal Thenar atrophy: negative Intrinsic wasting: negative Grip strength: fair to good grip strength Pincer strength: fair to good pincer strength Tinel`s test: positive Phalen`s test: positive Triggering: Negative Finkelstein`s test: negative Range of motion: Good range of motion of the digits  Left hand:  Tenderness: Negative Erythema: negative Swelling: negative Capillary Refill: normal Thenar atrophy: negative Intrinsic wasting: negative Grip strength: fair to good grip strength Pincer strength: fair to good pincer strength Tinel`s test: negative Phalen`s test: negative Triggering: No gross triggering  or locking of the digits Finkelstein`s test: negative Range of motion: Good range of motion of the digits Surgical incision is well-healed with minimal tenderness.  Neurologic:  Awake, alert , and oriented.  Sensory function is intact except for decreased discrimination to pinprick and light touch in a median nerve distribution om the right. Motor strength is judged to be 5/5 except as noted above. No clonus or tremor.  Motor coordination is within normal limits.  Impression: Bilateral carpal tunnel syndrome  Plan:  The findings were discussed in detail with the patient. He is doing well with regard t the left hand. He would like to make plans for right carpal tunnel release before the end of the year. Conservative treatment options were reviewed with the patient. We discussed the risks and benefits of surgical intervention. The usual perioperative course was also discussed in detail. The patient expressed understanding of the risks and benefits of surgical intervention and would like to proceed with plans for right carpal tunnel release.  He will need to discontinue the Eliquis 3 days prior to surgery.  MEDICAL CLEARANCE: Per anesthesiology ACTIVITIES: As tolerated. WORK STATUS: Not applicable. THERAPY: None MEDICATIONS: Requested Prescriptions   No prescriptions requested or ordered in this encounter   FOLLOW-UP: Return for postoperative follow-up.  Henson Fraticelli P. Holley Bouche., M.D.   Electronically signed by Lamar Benes., MD at 11/18/2020 9:08 PM EST

## 2020-11-21 ENCOUNTER — Inpatient Hospital Stay: Admission: RE | Admit: 2020-11-21 | Payer: Federal, State, Local not specified - PPO | Source: Ambulatory Visit

## 2020-11-22 ENCOUNTER — Other Ambulatory Visit
Admission: RE | Admit: 2020-11-22 | Discharge: 2020-11-22 | Disposition: A | Discharge status: Home / Self Care | Payer: Federal, State, Local not specified - PPO | Source: Ambulatory Visit | Attending: Orthopedic Surgery | Admitting: Orthopedic Surgery

## 2020-11-22 ENCOUNTER — Other Ambulatory Visit: Payer: Self-pay

## 2020-11-22 DIAGNOSIS — Z20822 Contact with and (suspected) exposure to covid-19: Secondary | ICD-10-CM | POA: Insufficient documentation

## 2020-11-22 DIAGNOSIS — Z01812 Encounter for preprocedural laboratory examination: Secondary | ICD-10-CM | POA: Insufficient documentation

## 2020-11-22 NOTE — Patient Instructions (Signed)
Your procedure is scheduled on: 11/27/20 Report to [facility]. To find out your arrival time please call [phone] between 1PM - 3PM on 11/23/20.  Remember: Instructions that are not followed completely may result in serious medical risk, up to and including death, or upon the discretion of your surgeon and anesthesiologist your surgery may need to be rescheduled.     _X__ 1. Do not eat food after midnight the night before your procedure.                 No gum chewing or hard candies. You may drink clear liquids up to 2 hours                 before you are scheduled to arrive for your surgery- DO not drink clear                 liquids within 2 hours of the start of your surgery.                 Clear Liquids include:  water, apple juice without pulp, clear carbohydrate                 drink such as Clearfast or Gatorade, Black Coffee or Tea (Do not add                 anything to coffee or tea). Diabetics water only  __X__2.  On the morning of surgery brush your teeth with toothpaste and water, you                 may rinse your mouth with mouthwash if you wish.  Do not swallow any              toothpaste of mouthwash.     _X__ 3.  No Alcohol for 24 hours before or after surgery.   _X__ 4.  Do Not Smoke or use e-cigarettes For 24 Hours Prior to Your Surgery.                 Do not use any chewable tobacco products for at least 6 hours prior to                 surgery.  ____  5.  Bring all medications with you on the day of surgery if instructed.   __X__  6.  Notify your doctor if there is any change in your medical condition      (cold, fever, infections).     Do not wear jewelry, make-up, hairpins, clips or nail polish. Do not wear lotions, powders, or perfumes.  Do not shave 48 hours prior to surgery. Men may shave face and neck. Do not bring valuables to the hospital.    [facility] is not responsible for any belongings  or valuables.  Contacts, dentures/partials or body piercings may not be worn into surgery. Bring a case for your contacts, glasses or hearing aids, a denture cup will be supplied. Leave your suitcase in the car. After surgery it may be brought to your room. For patients admitted to the hospital, discharge time is determined by your treatment team.   Patients discharged the day of surgery will not be allowed to drive home.   Please read over the following fact sheets that you were given:   MRSA Information  __X__ Take these medicines the morning of surgery with A SIP OF WATER:  1. citalopram (CELEXA) 40 MG tablet  2. diltiazem (CARDIZEM CD) 120 MG 24 hr capsule  3. pregabalin (LYRICA) 150 MG capsule  4.  5.  6.  ____ Fleet Enema (as directed)   __X__ Use CHG Soap/SAGE wipes as directed  ____ Use inhalers on the day of surgery  __X__ Stop metformin/Janumet/Farxiga 2 days prior to surgery  LAST DOSE 11/24/20  __X__ Take 1/2 of usual insulin dose the night before surgery. No insulin the morning    (30 UNITS)          of surgery.   __X__ Stop Blood Thinners Coumadin/Plavix/Xarelto/Pleta/Pradaxa/Eliquis/Effient/Aspirin  on   Or contact your Surgeon, Cardiologist or Medical Doctor regarding  ability to stop your blood thinners. DEFERRED TO DR Nehemiah Massed PATIENT TO CONTACT.  __X__ Stop Anti-inflammatories 7 days before surgery such as Advil, Ibuprofen, Motrin,  BC or Goodies Powder, Naprosyn, Naproxen, Aleve, Aspirin    __X__ Stop all herbal supplements, fish oil or vitamin E until after surgery.    ____ Bring C-Pap to the hospital.

## 2020-11-23 ENCOUNTER — Other Ambulatory Visit: Admission: RE | Admit: 2020-11-23 | Payer: Federal, State, Local not specified - PPO | Source: Ambulatory Visit

## 2020-11-23 ENCOUNTER — Encounter: Payer: Self-pay | Admitting: Orthopedic Surgery

## 2020-11-23 ENCOUNTER — Other Ambulatory Visit
Admission: RE | Admit: 2020-11-23 | Discharge: 2020-11-23 | Disposition: A | Discharge status: Home / Self Care | Payer: Federal, State, Local not specified - PPO | Source: Ambulatory Visit | Attending: Orthopedic Surgery | Admitting: Orthopedic Surgery

## 2020-11-23 DIAGNOSIS — Z20822 Contact with and (suspected) exposure to covid-19: Secondary | ICD-10-CM | POA: Diagnosis not present

## 2020-11-23 DIAGNOSIS — Z01812 Encounter for preprocedural laboratory examination: Secondary | ICD-10-CM | POA: Diagnosis not present

## 2020-11-23 LAB — BASIC METABOLIC PANEL
Anion gap: 11 (ref 5–15)
BUN: 13 mg/dL (ref 8–23)
CO2: 25 mmol/L (ref 22–32)
Calcium: 9.1 mg/dL (ref 8.9–10.3)
Chloride: 100 mmol/L (ref 98–111)
Creatinine, Ser: 1.03 mg/dL (ref 0.61–1.24)
GFR, Estimated: >60 mL/min (ref >60)
Glucose, Bld: 93 mg/dL (ref 70–99)
Potassium: 4.1 mmol/L (ref 3.5–5.1)
Sodium: 136 mmol/L (ref 135–145)

## 2020-11-23 NOTE — Progress Notes (Signed)
[facility] Perioperative Services  Pre-Admission/Anesthesia Testing Clinical Review  Date: 11/23/20  Patient Demographics:  Name: Stephen Yu DOB:   12-Jun-1956 MRN:   [id number]  Planned Surgical Procedure(s):    Case: [id number] Date/Time: 11/27/20 1703   Procedure: CARPAL TUNNEL RELEASE (Right Wrist)   Anesthesia type: Choice   Pre-op diagnosis: CARPAL TUNNEL SYNDROME, RIGHT.   Location: [facility] OR ROOM 01 / [facility] ORS FOR ANESTHESIA GROUP   Surgeons: Donato Heinz, MD    NOTE: Available PAT nursing documentation and vital signs have been reviewed. Clinical nursing staff has updated patient's PMH/PSHx, current medication list, and drug allergies/intolerances to ensure comprehensive history available to assist in medical decision making as it pertains to the aforementioned surgical procedure and anticipated anesthetic course.   Clinical Discussion:  Stephen Yu is a 65 y.o. male who is submitted for pre-surgical anesthesia review and clearance prior to him undergoing the above procedure. Patient is a Former Smoker (quit 12/1999). Pertinent PMH includes: paroxysmal atrial fibrillation/flutter, HTN, T2DM, hypothyroidism, DOE, GERD, OA, chronic pain syndrome (has intrathecal pump), oropharyngeal squamous cell carcinoma (s/p chemoradiation), long term exogenous testosterone use, long-term prescription opioid use, anxiety, depression  Patient is followed by cardiology Stephen Pounds, MD). He was last seen in the cardiology clinic on 10/23/2020; notes reviewed.  At the time of his clinic visit, patient noted to be doing well and overall clinically stable from a cardiovascular standpoint.  He denied chest pain, new or unusual shortness of breath, PND, orthopnea, peripheral edema, palpitations, vertiginous symptoms, or presyncope/syncope.  Activity limited due to ongoing neck and back pain; leads a moderately sedentary lifestyle. Functional capacity, as defined by DASI, is  documented as being >/= 4 METS.  PMH (+) for atrial fibrillation/flutter; oral diltiazem maintaining NSR.  Last TTE done in 10/2017 revealed normal left ventricular systolic function with mild LVH; LVEF >55% (see full interpretation cardiovascular test below). CHA2DS2-VASc Score = 3.  Patient chronically anticoagulated using daily dose of apixaban; compliant with therapy with no evidence of bleeding.  Blood pressure within desired range at 130/80.  Patient is on a statin for his HLD.  T2DM moderately controlled on currently prescribed regimen; last hemoglobin A1c 8.0% on 08/22/2020.  No other changes were made to patient's medication regimen.  Patient to follow-up with outpatient cardiology in 6 months or sooner if needed.  Patient scheduled to undergo an elective orthopedic procedure on 11/27/2020 with Dr. Francesco Sor.  Given patient's past medical history significant for cardiovascular issues, presurgical cardiac clearance was sought by the performing surgeon's office.  Per cardiology, "this appears to be a nonemergent low to moderate risk surgery.  He is able to achieve 4 METS and underwent a similar surgery on 09/25/2020 without any complications.  He is therefore safe to proceed with surgery at an overall LOW risk for cardiovascular complications."  Again, this patient is on daily anticoagulation therapy.  He has been instructed on recommendations from surgery and cardiology for holding his apixaban 3 days prior to surgery.  Is aware that his last dose of his anticoagulant will be on 11/23/2020.  He denies previous perioperative complications with anesthesia. He underwent a general anesthetic course here (ASA III) in 09/2020 with no documented complications.   Vitals with BMI 11/22/2020 09/25/2020 09/25/2020  Height 5\' 10"  - -  Weight 220 lbs - -  BMI 31.57 - -  Systolic - 110 107  Diastolic - 74 41  Pulse - 119 921    Providers/Specialists:  NOTE: Primary physician provider listed below.  Patient may have been seen by APP or partner within same practice.   PROVIDER ROLE / SPECIALTY LAST OV  Hooten, Laurice Record, MD Orthopedics (Surgeon)  11/14/2020  Idelle Crouch, MD Primary Care Provider  11/02/2020  Serafina Royals, MD Cardiology  10/23/2020   Allergies:  Biaxin [clarithromycin], Ceftin [cefuroxime axetil], and Penicillins  Current Home Medications:   No current facility-administered medications for this encounter.   Marland Kitchen apixaban (ELIQUIS) 5 MG TABS tablet  . cetirizine (ZYRTEC) 10 MG tablet  . Cholecalciferol (VITAMIN D3) 5000 units TABS  . citalopram (CELEXA) 40 MG tablet  . diltiazem (CARDIZEM CD) 120 MG 24 hr capsule  . fluticasone (FLONASE) 50 MCG/ACT nasal spray  . HYDROcodone-acetaminophen (NORCO) 7.5-325 MG tablet  . Insulin Glargine (BASAGLAR KWIKPEN) 100 UNIT/ML SOPN  . lisinopril (ZESTRIL) 10 MG tablet  . metFORMIN (GLUCOPHAGE) 500 MG tablet  . MORPHINE SULFATE IJ  . Multiple Vitamins-Minerals (PRESERVISION AREDS 2 PO)  . oxymetazoline (AFRIN) 0.05 % nasal spray  . pregabalin (LYRICA) 150 MG capsule  . rosuvastatin (CRESTOR) 10 MG tablet  . Semaglutide (OZEMPIC, 0.25 OR 0.5 MG/DOSE, [place])  . testosterone cypionate (DEPOTESTOSTERONE CYPIONATE) 200 MG/ML injection  . tiZANidine (ZANAFLEX) 4 MG tablet  . traMADol (ULTRAM-ER) 300 MG 24 hr tablet  . traZODone (DESYREL) 50 MG tablet  . vitamin E 180 MG (400 UNITS) capsule  . zinc gluconate 50 MG tablet  . Continuous Blood Gluc Sensor (FREESTYLE LIBRE 14 DAY SENSOR) MISC  . Insulin Pen Needle 32G X 4 MM MISC   . 0.9 %  sodium chloride infusion  . heparin lock flush 100 unit/mL   History:   Past Medical History:  Diagnosis Date  . Anxiety   . Arthritis   . 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 Chronic pain 1996   due to work injury  . Chronic prescription opiate use   . Colon polyp    adenomatous polyp ascending colon  . Cough    THAT WONT GO AWAY-PT TO SEE DR Doy Hutching ON 06-10-17 @ 4 PM  . CTS (carpal  tunnel syndrome)    bilateral  . Depression   . Diabetes mellitus without complication ([place])   . Dysrhythmia    AFIB X 1  . GERD (gastroesophageal reflux disease)   . Herniated lumbar intervertebral disc   . Hypertension   . Hypothyroidism   . Long-term current use of testosterone cypionate   . Neck pain   . Neuromuscular disorder ([place])   . Presence of intrathecal pump    Dr. Mechele Dawley in [place], [place]  . Vertigo    Avg: 2x/month   Past Surgical History:  Procedure Laterality Date  . CARPAL TUNNEL RELEASE Left 09/25/2020   Procedure: CARPAL TUNNEL RELEASE;  Surgeon: Stephen Leep, MD;  Location: [facility] ORS;  Service: Orthopedics;  Laterality: Left;  . CATARACT EXTRACTION W/PHACO Right 05/30/2020   Procedure: CATARACT EXTRACTION PHACO AND INTRAOCULAR LENS PLACEMENT (IOC) RIGHT DIABETIC 2.83 00:35.8;  Surgeon: Birder Robson, MD;  Location: [facility];  Service: Ophthalmology;  Laterality: Right;  Diabetic - insulin and oral meds  . COLON SURGERY    . COLONOSCOPY  2013   [place]  . COLONOSCOPY WITH PROPOFOL N/A 04/22/2017   Procedure: COLONOSCOPY WITH PROPOFOL;  Surgeon: Lollie Sails, MD;  Location: [facility] ENDOSCOPY;  Service: Endoscopy;  Laterality: N/A;  . HERNIA REPAIR    . IMAGE GUIDED SINUS SURGERY    .  implanted morphine pump  2016  . IR IMAGING GUIDED PORT INSERTION  09/26/2017  . IR REMOVAL TUN ACCESS W/ PORT W/O FL MOD SED  02/11/2018  . LAPAROSCOPIC CHOLECYSTECTOMY  2013  . PARTIAL COLECTOMY Right 06/17/2017   Procedure: PARTIAL COLECTOMY;  Surgeon: Christene Lye, MD;  Location: [facility] ORS;  Service: General;  Laterality: Right;  . UMBILICAL HERNIA REPAIR  2013  . UMBILICAL HERNIA REPAIR  06/17/2017   Procedure: HERNIA REPAIR UMBILICAL ADULT;  Surgeon: Christene Lye, MD;  Location: [facility] ORS;  Service: General;;  . VASECTOMY     Family History  Problem Relation Age of Onset  . Breast cancer Cousin 15  . Cancer - Colon Neg  Hx   . Prostate cancer Neg Hx    Social History   Tobacco Use  . Smoking status: Former Smoker    Years: 20.00    Types: Cigarettes    Quit date: 12/03/1999    Years since quitting: 20.9  . Smokeless tobacco: Never Used  Vaping Use  . Vaping Use: Never used  Substance Use Topics  . Alcohol use: No  . Drug use: No    Pertinent Clinical Results:  LABS: Labs reviewed: Acceptable for surgery.   Hospital Outpatient Visit on 11/23/2020  Component Date Value Ref Range Status  . Sodium 11/23/2020 136  135 - 145 mmol/L Final  . Potassium 11/23/2020 4.1  3.5 - 5.1 mmol/L Final  . Chloride 11/23/2020 100  98 - 111 mmol/L Final  . CO2 11/23/2020 25  22 - 32 mmol/L Final  . Glucose, Bld 11/23/2020 93  70 - 99 mg/dL Final   Glucose reference range applies only to samples taken after fasting for at least 8 hours.  . BUN 11/23/2020 13  8 - 23 mg/dL Final  . Creatinine, Ser 11/23/2020 1.03  0.61 - 1.24 mg/dL Final  . Calcium 11/23/2020 9.1  8.9 - 10.3 mg/dL Final  . GFR, Estimated 11/23/2020 >60  >60 mL/min Final   Comment: (NOTE) Calculated using the CKD-EPI Creatinine Equation (2021)   . Anion gap 11/23/2020 11  5 - 15 Final   Performed at [facility], [address] [place], [place] [address]            ECG: Date: 04/26/2020 Rate: 69 bpm Rhythm: Normal sinus rhythm with incomplete right bundle branch block Intervals: PR 142 ms. QRS 100 ms. QTc 400 ms. ST segment and T wave changes: No evidence of acute ST segment elevation or depression Comparison: Similar to previous tracing obtained on 04/28/2019 NOTE: Tracing obtained at [facility]; unable for review. Above based on cardiologist's interpretation.    IMAGING / PROCEDURES: ECHOCARDIOGRAM performed on 10/29/2017 1. LVEF >55% 2. Normal left ventricular systolic function with mild LVH 3. Normal right ventricular systolic function 4. Mild to moderate MR 5. Mild TR and PR 6. No AR 7. No valvular  stenosis 8. No evidence of pericardial effusion  Impression and Plan:  Lemond Mckinney has been referred for pre-anesthesia review and clearance prior to him undergoing the planned anesthetic and procedural courses. Available labs, pertinent testing, and imaging results were personally reviewed by me. This patient has been appropriately cleared by cardiology with an overall LOW risk for cardiovascular complications.   Based on clinical review performed today (11/23/20), barring any significant acute changes in the patient's overall condition, it is anticipated that he will be able to proceed with the planned surgical intervention. Any acute changes in clinical condition may  necessitate his procedure being postponed and/or cancelled. Pre-surgical instructions were reviewed with the patient during his PAT appointment and questions were fielded by PAT clinical staff.  Honor Loh, MSN, APRN, FNP-C, CEN [facility]  Peri-operative Services Nurse Practitioner Phone: [phone] 11/23/20 3:43 PM  NOTE: This note has been prepared using Dragon dictation software. Despite my best ability to proofread, there is always the potential that unintentional transcriptional errors may still occur from this process.

## 2020-11-23 NOTE — Pre-Procedure Instructions (Signed)
Attempted to contact patient via phone to remind him of his scheduled blood work and covid test today.  Left a voice mail message reminding him of the need to be here today before end of day.

## 2020-11-24 LAB — SARS CORONAVIRUS 2 (TAT 6-24 HRS): SARS Coronavirus 2: NEGATIVE

## 2020-11-26 ENCOUNTER — Encounter: Payer: Self-pay | Admitting: Orthopedic Surgery

## 2020-11-27 ENCOUNTER — Ambulatory Visit
Admission: RE | Admit: 2020-11-27 | Discharge: 2020-11-27 | Disposition: A | Discharge status: Home / Self Care | Payer: Federal, State, Local not specified - PPO | Attending: Orthopedic Surgery | Admitting: Orthopedic Surgery

## 2020-11-27 ENCOUNTER — Ambulatory Visit: Payer: Federal, State, Local not specified - PPO | Admitting: Urgent Care

## 2020-11-27 ENCOUNTER — Encounter
Admission: RE | Disposition: A | Discharge status: Home / Self Care | Payer: Self-pay | Source: Home / Self Care | Attending: Orthopedic Surgery

## 2020-11-27 ENCOUNTER — Encounter: Payer: Self-pay | Admitting: Orthopedic Surgery

## 2020-11-27 ENCOUNTER — Other Ambulatory Visit: Payer: Self-pay

## 2020-11-27 DIAGNOSIS — Z7984 Long term (current) use of oral hypoglycemic drugs: Secondary | ICD-10-CM | POA: Insufficient documentation

## 2020-11-27 DIAGNOSIS — Z9889 Other specified postprocedural states: Secondary | ICD-10-CM

## 2020-11-27 DIAGNOSIS — F32A Depression, unspecified: Secondary | ICD-10-CM | POA: Insufficient documentation

## 2020-11-27 DIAGNOSIS — Z79890 Hormone replacement therapy (postmenopausal): Secondary | ICD-10-CM | POA: Diagnosis not present

## 2020-11-27 DIAGNOSIS — I10 Essential (primary) hypertension: Secondary | ICD-10-CM | POA: Diagnosis not present

## 2020-11-27 DIAGNOSIS — Z79891 Long term (current) use of opiate analgesic: Secondary | ICD-10-CM | POA: Insufficient documentation

## 2020-11-27 DIAGNOSIS — Z8589 Personal history of malignant neoplasm of other organs and systems: Secondary | ICD-10-CM | POA: Diagnosis not present

## 2020-11-27 DIAGNOSIS — Z794 Long term (current) use of insulin: Secondary | ICD-10-CM | POA: Insufficient documentation

## 2020-11-27 DIAGNOSIS — Z87891 Personal history of nicotine dependence: Secondary | ICD-10-CM | POA: Diagnosis not present

## 2020-11-27 DIAGNOSIS — G894 Chronic pain syndrome: Secondary | ICD-10-CM | POA: Insufficient documentation

## 2020-11-27 DIAGNOSIS — Z7901 Long term (current) use of anticoagulants: Secondary | ICD-10-CM | POA: Insufficient documentation

## 2020-11-27 DIAGNOSIS — E119 Type 2 diabetes mellitus without complications: Secondary | ICD-10-CM | POA: Diagnosis not present

## 2020-11-27 DIAGNOSIS — I480 Paroxysmal atrial fibrillation: Secondary | ICD-10-CM | POA: Insufficient documentation

## 2020-11-27 DIAGNOSIS — Z9221 Personal history of antineoplastic chemotherapy: Secondary | ICD-10-CM | POA: Insufficient documentation

## 2020-11-27 DIAGNOSIS — G5601 Carpal tunnel syndrome, right upper limb: Secondary | ICD-10-CM | POA: Insufficient documentation

## 2020-11-27 DIAGNOSIS — Z79899 Other long term (current) drug therapy: Secondary | ICD-10-CM | POA: Insufficient documentation

## 2020-11-27 DIAGNOSIS — Z923 Personal history of irradiation: Secondary | ICD-10-CM | POA: Diagnosis not present

## 2020-11-27 DIAGNOSIS — F419 Anxiety disorder, unspecified: Secondary | ICD-10-CM | POA: Insufficient documentation

## 2020-11-27 HISTORY — DX: Long term (current) use of opiate analgesic: Z79.891

## 2020-11-27 HISTORY — PX: CARPAL TUNNEL RELEASE: SHX101

## 2020-11-27 HISTORY — DX: Hormone replacement therapy: Z79.890

## 2020-11-27 LAB — GLUCOSE, CAPILLARY
Glucose-Capillary: 162 mg/dL — ABNORMAL HIGH (ref 70–99)
Glucose-Capillary: 110 mg/dL — ABNORMAL HIGH (ref 70–99)

## 2020-11-27 SURGERY — CARPAL TUNNEL RELEASE
Anesthesia: General | Site: Wrist | Laterality: Right

## 2020-11-27 MED ORDER — ACETAMINOPHEN 325 MG PO TABS: 325.0000 mg | ORAL_TABLET | Freq: Four times a day (QID) | ORAL | Status: DC | PRN

## 2020-11-27 MED ORDER — ONDANSETRON HCL 4 MG/2ML IJ SOLN
INTRAMUSCULAR | Status: DC | PRN
  Administered 2020-11-27: 4 mg via INTRAVENOUS

## 2020-11-27 MED ORDER — DEXAMETHASONE SODIUM PHOSPHATE 10 MG/ML IJ SOLN
INTRAMUSCULAR | Status: DC | PRN
  Administered 2020-11-27: 10 mg via INTRAVENOUS

## 2020-11-27 MED ORDER — MIDAZOLAM HCL 2 MG/2ML IJ SOLN
INTRAMUSCULAR | Status: AC
  Filled 2020-11-27: qty 2

## 2020-11-27 MED ORDER — OXYCODONE HCL 5 MG PO TABS: 5.0000 mg | ORAL_TABLET | ORAL | Status: DC | PRN

## 2020-11-27 MED ORDER — ACETAMINOPHEN 10 MG/ML IV SOLN
INTRAVENOUS | Status: AC
  Filled 2020-11-27: qty 100

## 2020-11-27 MED ORDER — ONDANSETRON HCL 4 MG/2ML IJ SOLN: 4.0000 mg | Freq: Four times a day (QID) | INTRAMUSCULAR | Status: DC | PRN

## 2020-11-27 MED ORDER — NEOMYCIN-POLYMYXIN B GU 40-200000 IR SOLN
Status: AC
  Filled 2020-11-27: qty 2

## 2020-11-27 MED ORDER — SODIUM CHLORIDE 0.9 % IV SOLN: INTRAVENOUS | Status: DC

## 2020-11-27 MED ORDER — OXYCODONE HCL 5 MG PO TABS: 10.0000 mg | ORAL_TABLET | ORAL | Status: DC | PRN

## 2020-11-27 MED ORDER — BUPIVACAINE HCL (PF) 0.25 % IJ SOLN
INTRAMUSCULAR | Status: DC | PRN
  Administered 2020-11-27: 10 mL

## 2020-11-27 MED ORDER — METOCLOPRAMIDE HCL 5 MG/ML IJ SOLN: 5.0000 mg | Freq: Three times a day (TID) | INTRAMUSCULAR | Status: DC | PRN

## 2020-11-27 MED ORDER — FENTANYL CITRATE (PF) 100 MCG/2ML IJ SOLN: 25.0000 ug | INTRAMUSCULAR | Status: DC | PRN

## 2020-11-27 MED ORDER — EPHEDRINE SULFATE 50 MG/ML IJ SOLN
INTRAMUSCULAR | Status: DC | PRN
  Administered 2020-11-27 (×2): 10 mg via INTRAVENOUS

## 2020-11-27 MED ORDER — ORAL CARE MOUTH RINSE: 15.0000 mL | Freq: Once | OROMUCOSAL | Status: AC

## 2020-11-27 MED ORDER — LIDOCAINE HCL (CARDIAC) PF 100 MG/5ML IV SOSY
PREFILLED_SYRINGE | INTRAVENOUS | Status: DC | PRN
  Administered 2020-11-27: 100 mg via INTRAVENOUS

## 2020-11-27 MED ORDER — PROPOFOL 10 MG/ML IV BOLUS
INTRAVENOUS | Status: DC | PRN
  Administered 2020-11-27: 150 mg via INTRAVENOUS

## 2020-11-27 MED ORDER — MIDAZOLAM HCL 2 MG/2ML IJ SOLN
INTRAMUSCULAR | Status: DC | PRN
  Administered 2020-11-27: 2 mg via INTRAVENOUS

## 2020-11-27 MED ORDER — ONDANSETRON HCL 4 MG PO TABS: 4.0000 mg | ORAL_TABLET | Freq: Four times a day (QID) | ORAL | Status: DC | PRN

## 2020-11-27 MED ORDER — HYDROMORPHONE HCL 1 MG/ML IJ SOLN: 0.5000 mg | INTRAMUSCULAR | Status: DC | PRN

## 2020-11-27 MED ORDER — CHLORHEXIDINE GLUCONATE 0.12 % MT SOLN: 15.0000 mL | Freq: Once | OROMUCOSAL | Status: AC

## 2020-11-27 MED ORDER — METOCLOPRAMIDE HCL 10 MG PO TABS: 5.0000 mg | ORAL_TABLET | Freq: Three times a day (TID) | ORAL | Status: DC | PRN

## 2020-11-27 MED ORDER — FENTANYL CITRATE (PF) 100 MCG/2ML IJ SOLN
INTRAMUSCULAR | Status: AC
  Filled 2020-11-27: qty 2

## 2020-11-27 MED ORDER — CHLORHEXIDINE GLUCONATE 0.12 % MT SOLN
OROMUCOSAL | Status: AC
  Administered 2020-11-27: 15:00:00 15 mL via OROMUCOSAL
  Filled 2020-11-27: qty 15

## 2020-11-27 MED ORDER — PHENYLEPHRINE HCL (PRESSORS) 10 MG/ML IV SOLN
INTRAVENOUS | Status: DC | PRN
  Administered 2020-11-27 (×4): 100 ug via INTRAVENOUS

## 2020-11-27 MED ORDER — ONDANSETRON HCL 4 MG/2ML IJ SOLN: 4.0000 mg | Freq: Once | INTRAMUSCULAR | Status: DC | PRN

## 2020-11-27 MED ORDER — OXYCODONE HCL 5 MG PO TABS
ORAL_TABLET | ORAL | Status: AC
  Administered 2020-11-27: 18:00:00 5 mg via ORAL
  Filled 2020-11-27: qty 1

## 2020-11-27 MED ORDER — BUPIVACAINE HCL (PF) 0.25 % IJ SOLN
INTRAMUSCULAR | Status: AC
  Filled 2020-11-27: qty 30

## 2020-11-27 MED ORDER — ACETAMINOPHEN 10 MG/ML IV SOLN
INTRAVENOUS | Status: DC | PRN
  Administered 2020-11-27: 1000 mg via INTRAVENOUS

## 2020-11-27 MED ORDER — FENTANYL CITRATE (PF) 100 MCG/2ML IJ SOLN
INTRAMUSCULAR | Status: DC | PRN
  Administered 2020-11-27: 25 ug via INTRAVENOUS
  Administered 2020-11-27: 50 ug via INTRAVENOUS
  Administered 2020-11-27: 25 ug via INTRAVENOUS

## 2020-11-27 SURGICAL SUPPLY — 29 items
BNDG CMPR STD VLCR NS LF 5.8X3 (GAUZE/BANDAGES/DRESSINGS) ×1
BNDG ELASTIC 3X5.8 VLCR NS LF (GAUZE/BANDAGES/DRESSINGS) ×3 IMPLANT
BNDG ESMARK 4X12 TAN STRL LF (GAUZE/BANDAGES/DRESSINGS) ×3 IMPLANT
CANISTER SUCT 1200ML W/VALVE (MISCELLANEOUS) ×3 IMPLANT
CAST PADDING 3X4FT ST 30246 (SOFTGOODS) ×2
COVER WAND RF STERILE (DRAPES) ×3 IMPLANT
CUFF TOURN SGL QUICK 18X4 (TOURNIQUET CUFF) ×3 IMPLANT
DRSG DERMACEA 8X12 NADH (GAUZE/BANDAGES/DRESSINGS) ×3 IMPLANT
DURAPREP 26ML APPLICATOR (WOUND CARE) ×3 IMPLANT
ELECT CAUTERY BLADE 6.4 (BLADE) ×3 IMPLANT
ELECT REM PT RETURN 9FT ADLT (ELECTROSURGICAL) ×3
ELECTRODE REM PT RTRN 9FT ADLT (ELECTROSURGICAL) ×1 IMPLANT
GAUZE SPONGE 4X4 12PLY STRL (GAUZE/BANDAGES/DRESSINGS) ×3 IMPLANT
GLOVE BIOGEL M STRL SZ7.5 (GLOVE) ×3 IMPLANT
GLOVE INDICATOR 8.0 STRL GRN (GLOVE) ×3 IMPLANT
GOWN STRL REUS W/ TWL LRG LVL3 (GOWN DISPOSABLE) ×2 IMPLANT
GOWN STRL REUS W/TWL LRG LVL3 (GOWN DISPOSABLE) ×6
KIT TURNOVER KIT A (KITS) ×3 IMPLANT
MANIFOLD NEPTUNE II (INSTRUMENTS) ×3 IMPLANT
NS IRRIG 500ML POUR BTL (IV SOLUTION) ×3 IMPLANT
PACK EXTREMITY ARMC (MISCELLANEOUS) ×3 IMPLANT
PAD CAST CTTN 3X4 STRL (SOFTGOODS) ×1 IMPLANT
PADDING CAST COTTON 3X4 STRL (SOFTGOODS) ×1
SOL PREP PVP 2OZ (MISCELLANEOUS) ×3
SOLUTION PREP PVP 2OZ (MISCELLANEOUS) ×1 IMPLANT
SPLINT CAST 1 STEP 3X12 (MISCELLANEOUS) ×3 IMPLANT
STOCKINETTE 48X4 2 PLY STRL (GAUZE/BANDAGES/DRESSINGS) ×1 IMPLANT
STOCKINETTE STRL 4IN 9604848 (GAUZE/BANDAGES/DRESSINGS) ×3 IMPLANT
SUT ETHILON 5-0 FS-2 18 BLK (SUTURE) ×3 IMPLANT

## 2020-11-27 NOTE — Discharge Instructions (Signed)
Instructions after Hand / Wrist Surgery   James P. Hooten, Jr., M.D.  Dept. of Orthopaedics & Sports Medicine  [facility]  [address]  [place], [place]  [address]   Phone: [phone]   Fax: [phone]   DIET: . Drink plenty of non-alcoholic fluids & begin a light diet. . Resume your normal diet the day after surgery.  ACTIVITY:  . Keep the hand elevated above the level of the elbow. . Begin gently moving the fingers on a regular basis to avoid stiffness. . Avoid any heavy lifting, pushing, or pulling with the operative hand. . Do not drive or operate any equipment until instructed.  WOUND CARE:  . Keep the splint/bandage clean and dry.  . The splint and stitches will be removed in the office. . Continue to use the ice packs periodically to reduce pain and swelling. . You may bathe or shower after the stitches are removed at the first office visit following surgery.  MEDICATIONS: . You may resume your regular medications. . Please take the pain medication as prescribed. . Do not take pain medication on an empty stomach. . Do not drive or drink alcoholic beverages when taking pain medications.  CALL THE OFFICE FOR: . Temperature above 101 degrees . Excessive bleeding or drainage on the dressing. . Excessive swelling, coldness, or paleness of the fingers. . Persistent nausea and vomiting.  FOLLOW-UP:  . You should have an appointment to return to the office in 7-10 days after surgery.   REMEMBER: R.I.C.E. = Rest, Ice, Compression, Elevation !     [facility] Department Directory         [email or web address]       [email or web address]          Cardiology  Appointments: [place] - [phone] [place] - [phone]  Endocrinology  Appointments: [place] - [phone] [place] - [phone]  Gastroenterology  Appointments: [place] - [phone] [place] - [phone]        General  Surgery   Appointments: [place] - [phone]  Internal Medicine/Family Medicine  Appointments: [place] - [phone] [place] - [phone] [place] - [phone]  Metabolic and Weigh Loss Surgery  Appointments: [place] - [phone]        Neurology  Appointments: [place] - [phone] [place] - [phone]  Neurosurgery  Appointments: [place] - [phone]  Obstetrics & Gynecology  Appointments: [place] - [phone] [place] - [phone]        Pediatrics  Appointments: [place] - [phone] [place] - [phone]  Physiatry  Appointments: [place] -[phone]  Physical Therapy  Appointments: [place] - [phone] [place] - [phone]        Podiatry  Appointments: [place] - [phone] [place] - [phone]  Pulmonology  Appointments: [place] - [phone]  Rheumatology  Appointments: [place] - [phone]        [place] Location: [facility]  [address] [place], [place]  [address]  [place] Location: [facility] [address] [place], [place]  [address]  [place] Location: [facility] [address] [place], [place]  [address]     AMBULATORY SURGERY  DISCHARGE INSTRUCTIONS   1) The drugs that you were given will stay in your system until tomorrow so for the next 24 hours you should not:  A) Drive an automobile B) Make any legal decisions C) Drink any alcoholic beverage   2) You may resume regular meals tomorrow.  Today it is better to start with liquids and gradually work up to solid foods.  You may eat anything you prefer, but it is better   to start with liquids, then soup and crackers, and gradually work up to solid foods.   3) Please notify your doctor immediately if you have any unusual bleeding, trouble breathing, redness and pain at the surgery site, drainage, fever, or pain not relieved by medication.    4) Additional Instructions:        Please contact your  physician with any problems or Same Day Surgery at [phone], Monday through Friday 6 am to 4 pm, or [place] at  Main number at [phone]. 

## 2020-11-27 NOTE — Anesthesia Procedure Notes (Signed)
Procedure Name: LMA Insertion Date/Time: 11/27/2020 5:04 PM Performed by: Junious Silk, CRNA Pre-anesthesia Checklist: Patient identified, Patient being monitored, Timeout performed, Emergency Drugs available and Suction available Patient Re-evaluated:Patient Re-evaluated prior to induction Oxygen Delivery Method: Circle system utilized Preoxygenation: Pre-oxygenation with 100% oxygen Induction Type: IV induction Ventilation: Mask ventilation without difficulty LMA: LMA inserted LMA Size: 4.0 Tube type: Oral Number of attempts: 1 Placement Confirmation: positive ETCO2 and breath sounds checked- equal and bilateral Tube secured with: Tape Dental Injury: Teeth and Oropharynx as per pre-operative assessment

## 2020-11-27 NOTE — Op Note (Signed)
OPERATIVE NOTE  DATE OF SURGERY:  11/27/2020  PATIENT NAME:  Stephen Yu   DOB: 05-09-1956  MRN: [id number]  PRE-OPERATIVE DIAGNOSIS: Right carpal tunnel syndrome  POST-OPERATIVE DIAGNOSIS:  Same  PROCEDURE:  Right carpal tunnel release  SURGEON:  Jena Gauss. M.D.  ANESTHESIA: general  ESTIMATED BLOOD LOSS: Minimal  FLUIDS REPLACED: 1000 mL of crystalloid  TOURNIQUET TIME: 29 minutes  DRAINS: None  INDICATIONS FOR SURGERY: Wilhelm Ganaway is a 64 y.o. year old male with a long history of numbness and paresthesias to the right hand.  Symptoms and clinical findings were consistent with carpal tunnel syndrome.The patient had not seen any significant improvement despite conservative nonsurgical intervention. After discussion of the risks and benefits of surgical intervention, the patient expressed understanding of the risks benefits and agree with plans for carpal tunnel release.   PROCEDURE IN DETAIL: The patient was brought into the operating room and after adequate general anesthesia, a tourniquet was placed on the patient's right upper arm.The right hand and arm were prepped with alcohol and Duraprep and draped in the usual sterile fashion. A "time-out" was performed as per usual protocol. The hand and forearm were exsanguinated using an Esmarch and the tourniquet was inflated to 250 mmHg. Loupe magnification was used throughout the procedure. An incision was made just ulnar to the thenar palmar crease. Dissection was carried down through the palmar fascia to the transverse carpal ligament. The transverse carpal ligament was sharply incised, taking care to protect the underlying structures with the carpal tunnel. Complete release of the transverse carpal ligament was achieved. There was no evidence of ganglion cyst or lipoma within the carpal tunnel. The wound was irrigated with copious amounts of normal saline with antibiotic solution. The skin was then re-approximated  with interrupted sutures of #5-0 nylon. A sterile dressing was applied followed by application of a volar splint. The tourniquet was deflated with a total tourniquet time of 29 minutes.  The patient tolerated the procedure well and was transported to the PACU in stable condition.  Erby Sanderson P. Angie Fava., M.D.

## 2020-11-27 NOTE — Transfer of Care (Signed)
Immediate Anesthesia Transfer of Care Note  Patient: Stephen Yu  Procedure(s) Performed: CARPAL TUNNEL RELEASE (Right Wrist)  Patient Location: PACU  Anesthesia Type:General  Level of Consciousness: sedated  Airway & Oxygen Therapy: Patient Spontanous Breathing and Patient connected to face mask oxygen  Post-op Assessment: Report given to RN and Post -op Vital signs reviewed and stable  Post vital signs: Reviewed and stable  Last Vitals:  Vitals Value Taken Time  BP    Temp    Pulse 73 11/27/20 1728  Resp 7 11/27/20 1728  SpO2 98 % 11/27/20 1728  Vitals shown include unvalidated device data.  Last Pain:  Vitals:   11/27/20 1517  TempSrc: Tympanic  PainSc: 7          Complications: No complications documented.

## 2020-11-27 NOTE — H&P (Signed)
The patient has been re-examined, and the chart reviewed, and there have been no interval changes to the documented history and physical.    The risks, benefits, and alternatives have been discussed at length. The patient expressed understanding of the risks benefits and agreed with plans for surgical intervention.  Lynden Carrithers P. Brynli Ollis, Jr. M.D.    

## 2020-11-27 NOTE — Anesthesia Preprocedure Evaluation (Signed)
Anesthesia Evaluation  Patient identified by MRN, date of birth, ID band Patient awake    Reviewed: Allergy & Precautions, NPO status , Patient's Chart, lab work & pertinent test results  History of Anesthesia Complications Negative for: history of anesthetic complications  Airway Mallampati: II  TM Distance: >3 FB Neck ROM: Full    Dental  (+) Poor Dentition, Dental Advisory Given   Pulmonary neg pulmonary ROS, neg sleep apnea, neg COPD, Patient abstained from smoking.Not current smoker, former smoker,    Pulmonary exam normal breath sounds clear to auscultation       Cardiovascular Exercise Tolerance: Good METShypertension, (-) CAD and (-) Past MI + dysrhythmias Atrial Fibrillation  Rhythm:Irregular Rate:Normal - Systolic murmurs    Neuro/Psych PSYCHIATRIC DISORDERS Anxiety Depression In-situ morphine pump for back pain On chronic opioids as well  Neuromuscular disease    GI/Hepatic GERD  Controlled,(+)     (-) substance abuse  ,   Endo/Other  diabetesHypothyroidism   Renal/GU negative Renal ROS     Musculoskeletal  (+) Arthritis , Osteoarthritis,    Abdominal   Peds  Hematology 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; no excessive neck extension restriction, but does have chronic dry mouth   Anesthesia Other Findings Past Medical History: No date: Anxiety No date: Arthritis 08/21/2017: Cancer ([facility]) 1996: Chronic pain     Comment:  due to work injury No date: Colon polyp     Comment:  adenomatous polyp ascending colon No date: Cough     Comment:  THAT WONT GO AWAY-PT TO SEE DR Judithann Sheen ON 06-10-17 @ 4 PM No date: CTS (carpal tunnel syndrome)     Comment:  bilateral No date: Depression No date: Diabetes mellitus without complication (HCC) No date: Dysrhythmia     Comment:  AFIB X 1 No date: GERD (gastroesophageal reflux disease) No date: Herniated lumbar intervertebral disc No date: Hypertension No date:  Hypothyroidism No date: Neck pain No date: Neuromuscular disorder (HCC) No date: Presence of intrathecal pump     Comment:  Dr. Roderic Ovens in [place], [facility] No date: Vertigo     Comment:  Avg: 2x/month  Reproductive/Obstetrics                             Anesthesia Physical  Anesthesia Plan  ASA: III  Anesthesia Plan: General   Post-op Pain Management:    Induction: Intravenous  PONV Risk Score and Plan: 2 and Ondansetron, Dexamethasone and Midazolam  Airway Management Planned: LMA  Additional Equipment: None  Intra-op Plan:   Post-operative Plan: Extubation in OR  Informed Consent: I have reviewed the patients History and Physical, chart, labs and discussed the procedure including the risks, benefits and alternatives for the proposed anesthesia with the patient or authorized representative who has indicated his/her understanding and acceptance.     Dental advisory given  Plan Discussed with: CRNA and Surgeon  Anesthesia Plan Comments: (Discussed risks of anesthesia with patient, including PONV, sore throat, lip/dental damage. Rare risks discussed as well, such as cardiorespiratory and neurological sequelae. Patient understands.)        Anesthesia Quick Evaluation

## 2020-11-28 ENCOUNTER — Encounter: Payer: Self-pay | Admitting: Orthopedic Surgery

## 2020-11-29 NOTE — Anesthesia Postprocedure Evaluation (Signed)
Anesthesia Post Note  Patient: Stephen Yu  Procedure(s) Performed: CARPAL TUNNEL RELEASE (Right Wrist)  Patient location during evaluation: PACU Anesthesia Type: General Level of consciousness: awake and alert and oriented Pain management: pain level controlled Vital Signs Assessment: post-procedure vital signs reviewed and stable Respiratory status: spontaneous breathing Cardiovascular status: blood pressure returned to baseline Anesthetic complications: no   No complications documented.   Last Vitals:  Vitals:   11/27/20 1816 11/27/20 1832  BP: 126/82 126/80  Pulse: 79 76  Resp: 18 16  Temp:  (!) 36.2 C  SpO2: 98% 98%    Last Pain:  Vitals:   11/28/20 0842  TempSrc:   PainSc: 0-No pain                 Arneisha Kincannon

## 2021-01-03 ENCOUNTER — Other Ambulatory Visit: Payer: Self-pay | Admitting: General Surgery

## 2021-01-03 NOTE — Progress Notes (Signed)
Subjective:     Patient ID: Stephen Yu is a 65 y.o. male.  HPI  The following portions of the patient's history were reviewed and updated as appropriate.  This an established patient is here today for: office visit. Here to discuss having a colonoscopy, last in 2018. Bowels move daily, no bleeding.  The patient is on a morphine pump for chronic back pain.  Long history of diabetes-10-15 years.  Eliquis therapy for intermittent atrial fibrillation.  He is here with his wife, Stephen Yu.   Review of Systems  Constitutional: Negative for chills and fever.  Respiratory: Negative for cough.        Chief Complaint  Patient presents with  . Follow-up     BP (!) 146/72   Pulse 93   Temp 37.1 C (98.8 F)   Ht 177.8 cm (5' 10")   Wt (!) 101.2 kg (223 lb)   SpO2 94%   BMI 32.00 kg/m       Past Medical History:  Diagnosis Date  . Anxiety   . Arthritis   . 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 Status post chemotherapy. Follows at [facility].  . Chronic pain 1996  . Chronic prescription opiate use   . Colon polyp   . Depression   . GERD (gastroesophageal reflux disease)   . Herniated lumbar intervertebral disc   . 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 Hyperlipidemia   . Hyperplastic colon polyp 04/22/2017  . Hypertension   . Hypothyroidism   . Long-term current use of testosterone cypionate   . Neuromuscular disorder (CMS-HCC)   . Oropharyngeal cancer (CMS-HCC) 2018 - 2019   s/p chemotherapy and radiation for SCC oropharynx, currently in remission  . Paroxysmal atrial fibrillation (CMS-HCC)   . Presence of intrathecal pump   . Severe carpal tunnel syndrome of both wrists   . Tubular adenoma of colon, unspecified 04/22/2017  . Type 2 diabetes mellitus (CMS-HCC)   . Vertigo           Past Surgical History:  Procedure Laterality Date  . CATARACT  EXTRACTION Right 05/30/2020  . CHOLECYSTECTOMY  2013  . COLONOSCOPY  04/22/2017   Tubular adenoma of colon/Hyperplastic colon polyp/Repeat 2 to 3 yrs/MUS  . COLONOSCOPY  2013  . history of partial colectomy Right 06/17/2017  . image guided sinus surgery    . implanted morphine pump  2016  . IR imaging guided port insertion  09/26/2017  . IR removal tun access with port w/o fl mod sed  02/11/2018  . Left carpal tunnel release  09/25/2020   Dr Marry Guan  . Right carpal tunnel release  11/27/2020   Dr Marry Guan  . UMBILICAL HERNIA REPAIR  06/17/2017  . UMBILICAL HERNIA REPAIR  2013  . VASECTOMY         Social History          Socioeconomic History  . Marital status: Married    Spouse name: Hilda Blades  . Number of children: 4  . Years of education: 86  . Highest education level: Not on file  Occupational History  . Occupation: Retired  Tobacco Use  . Smoking status: Former Smoker    Years: 20.00    Types: Cigarettes    Quit date: 12/03/1999    Years since quitting: 21.0  . Smokeless tobacco: Never Used  Vaping Use  . Vaping Use: Never used  Substance and Sexual Activity  . Alcohol  use: No  . Drug use: No  . Sexual activity: Defer  Other Topics Concern  . Not on file  Social History Narrative  . Not on file   Social Determinants of Health   Financial Resource Strain: Not on file  Food Insecurity: Not on file  Transportation Needs: Not on file            Allergies  Allergen Reactions  . Penicillin Hives  . Ceftin [Cefuroxime Axetil] Other (See Comments)    Severe hiccups  . Biaxin [Clarithromycin] Other (See Comments)    Causes hicups  . Cephalexin Other (See Comments)    Hicups    Current Medications        Current Outpatient Medications  Medication Sig Dispense Refill  . BD LUER-LOK SYRINGE 3 mL 23 gauge x 1 1/2" Syrg     . cetirizine (ZYRTEC) 10 MG tablet Take 10 mg by mouth once daily       . cholecalciferol,  vitamin D3, (VITAMIN D3) 125 mcg (5,000 unit) Tab Take 5,000 Units by mouth once daily Dose unknown      . citalopram (CELEXA) 40 MG tablet Take 1 tablet (40 mg total) by mouth once daily 90 tablet 3  . diltiazem (CARDIZEM CD) 120 MG XR capsule Take 1 capsule (120 mg total) by mouth 2 (two) times daily 180 capsule 4  . ELIQUIS 5 mg tablet TAKE 1 TABLET BY MOUTH TWICE A DAY 180 tablet 1  . fluticasone propionate (FLONASE) 50 mcg/actuation nasal spray Place 2 sprays into both nostrils 2 (two) times daily    . FREESTYLE LIBRE 14 DAY reader USE AS DIRECTED 1 each 1  . FREESTYLE LIBRE 14 DAY SENSOR kit USE AS DIRECTED 1 kit 5  . HYDROcodone-acetaminophen (NORCO) 7.5-325 mg tablet Take 1 tablet by mouth every 6 (six) hours as needed for Pain.    . insulin GLARGINE (BASAGLAR KWIKPEN U-100 INSULIN) pen injector (concentration 100 units/mL) Inject 50 Units subcutaneously once daily (Patient taking differently: Inject 60 Units subcutaneously once daily   ) 15 mL 5  . lisinopriL (ZESTRIL) 10 MG tablet Take 1 tablet (10 mg total) by mouth once daily 90 tablet 4  . metFORMIN (GLUCOPHAGE) 500 MG tablet TAKE 1 TABLET(500 MG) BY MOUTH TWICE DAILY WITH MEALS 180 tablet 2  . methyl salicylate-menthol 70-01 % cream Apply 1 Application topically 4 (four) times daily as needed       . morphine sulfate/PF (MORPHINE, PF,) pump    . oxymetazoline (AFRIN) 0.05 % nasal spray Place 1 spray into both nostrils once daily    . pen needle, diabetic (BD ULTRA-FINE NANO PEN NEEDLE) 32 gauge x 5/32" Ndle Inject 100 each subcutaneously once daily 100 each 5  . pregabalin (LYRICA) 150 MG capsule Take 150 mg by mouth 2 (two) times daily.    . pseudoephedrine-guaifenesin (MUCINEX D) 60-600 mg XR tablet Take 1 tablet by mouth every 12 (twelve) hours    . rosuvastatin (CRESTOR) 10 MG tablet Take 1 tablet (10 mg total) by mouth once daily 90 tablet 3  . semaglutide 1 mg/dose (4 mg/3 mL) PnIj Inject 1 mg subcutaneously once  a week       . testosterone cypionate (DEPO-TESTOSTERONE) 200 mg/mL injection INJECT ONE ML INTO THE MUSCLE EVERY 14 DAYS 4 mL 2  . tiZANidine (ZANAFLEX) 4 MG capsule Take 4 mg by mouth every 8 (eight) hours as needed for Muscle spasms.    . traMADoL (ULTRAM-ER) 300 MG ER tablet  Take 300 mg by mouth once daily as needed       . traZODone (DESYREL) 50 MG tablet TAKE 1 TABLET BY MOUTH DAILY AT BEDTIME 30 tablet 5  . vit C/E/Zn/coppr/lutein/zeaxan (PRESERVISION AREDS-2 ORAL) Take 1 tablet by mouth 2 (two) times daily    . vitamin E 400 UNIT capsule Take 400 Units by mouth 2 (two) times daily as needed      . semaglutide (OZEMPIC) pen injector Inject 0.75 mLs (1 mg total) subcutaneously once a week for 30 days 3 mL 5   No current facility-administered medications for this visit.           Family History  Problem Relation Age of Onset  . High blood pressure (Hypertension) Mother   . Diabetes type II Maternal Grandmother   . High blood pressure (Hypertension) Maternal Grandmother   . Breast cancer Cousin   . Colon cancer Neg Hx         Objective:   Physical Exam Constitutional:      Appearance: Normal appearance.  Cardiovascular:     Rate and Rhythm: Normal rate and regular rhythm.     Pulses: Normal pulses.     Heart sounds: Normal heart sounds.  Pulmonary:     Effort: Pulmonary effort is normal.     Breath sounds: Normal breath sounds.  Musculoskeletal:     Cervical back: Neck supple.  Skin:    General: Skin is warm and dry.  Neurological:     Mental Status: He is alert and oriented to person, place, and time.  Psychiatric:        Mood and Affect: Mood normal.        Behavior: Behavior normal.    Labs and Radiology:   Apr 22, 2017 colonoscopy:  DIAGNOSIS:  A. COLON POLYP 6, RECTOSIGMOID; COLD BIOPSY:  - HYPERPLASTIC POLYP (7).  - NEGATIVE FOR DYSPLASIA AND MALIGNANCY.   B. COLON POLYP 2, TRANSVERSE; COLD SNARE:  - TUBULAR ADENOMA (2).  -  NEGATIVE FOR HIGH-GRADE DYSPLASIA AND MALIGNANCY.   C. COLON POLYP, ASCENDING; COLD SNARE:  - TUBULAR ADENOMA.  - NEGATIVE FOR HIGH GRADE DYSPLASIA AND MALIGNANCY.   D. ILEOCECAL VALVE, ATYPICAL MUCOSA; BIOPSY:  - SMALL INTESTINAL MUCOSA WITH PROMINENT LYMPHOID AGGREGATE.  - NEGATIVE FOR DYSPLASIA AND MALIGNANCY.   E. COLON POLYP, PROXIMAL TRANSVERSE; COLD BIOPSY:  - TUBULAR ADENOMA.  - NEGATIVE FOR HIGH GRADE DYSPLASIA AND MALIGNANCY.   F. COLON POLYP, DESCENDING; COLD SNARE:  - TUBULAR ADENOMA.  - NEGATIVE FOR HIGH GRADE DYSPLASIA AND MALIGNANCY.   G. COLON POLYP, DISTAL DESCENDING; COLD SNARE:  - TUBULAR ADENOMA.  - NEGATIVE FOR HIGH GRADE DYSPLASIA AND MALIGNANCY.   H. COLON POLYPS, PROXIMAL SIGMOID; COLD SNARE:  - TUBULAR ADENOMA.  - NEGATIVE FOR HIGH GRADE DYSPLASIA AND MALIGNANCY  June 17, 2017 right colectomy:  DIAGNOSIS:  A. TERMINAL ILEUM, APPENDIX, RIGHT COLON; RIGHT HEMICOLECTOMY:  - TUBULAR ADENOMA, 1.9 CM, WITH FOCAL HIGH-GRADE DYSPLASIA, LOCATED 9 CM  FROM ILEOCECAL VALVE AND 8 CM FROM DISTAL MARGIN; [place] INK IS PRESENT.  - TUBULAR ADENOMA, 0.2 CM, LOCATED 3 CM FROM DISTAL MARGIN.  - 14 BENIGN REGIONAL LYMPH NODES.  - APPENDICEAL NEUROMA (FIBROUS OBLITERATION OF LUMEN)  - SEPARATE PIECE OF HEMORRHAGIC ADIPOSE TISSUE CONSISTENT WITH OMENTUM.   ECG Apr 27, 2019 20-year.:  Normal sinus rhythm with incomplete right bundle branch block.    Assessment:     Candidate for repeat colonoscopy based prior colonic polyps and right colectomy.  Plan:     The patient will hold his Eliquis for 2 days prior to the procedure.  He has been asked to decrease his evening insulin by 50% the day prior to the prep and procedure.  No make use of Dulcolax 10 mg twice daily the day prior to prep.     Entered by Karie Fetch, RN, acting as a scribe for Dr. Hervey Ard, MD.  The documentation recorded by the scribe accurately reflects the service I personally  performed and the decisions made by me.   Andyn Bellow, MD FACS

## 2021-01-10 ENCOUNTER — Other Ambulatory Visit
Admission: RE | Admit: 2021-01-10 | Discharge: 2021-01-10 | Disposition: A | Discharge status: Home / Self Care | Payer: Federal, State, Local not specified - PPO | Source: Ambulatory Visit | Attending: General Surgery | Admitting: General Surgery

## 2021-01-10 ENCOUNTER — Other Ambulatory Visit: Payer: Self-pay

## 2021-01-10 DIAGNOSIS — Z7984 Long term (current) use of oral hypoglycemic drugs: Secondary | ICD-10-CM | POA: Diagnosis not present

## 2021-01-10 DIAGNOSIS — Z79890 Hormone replacement therapy (postmenopausal): Secondary | ICD-10-CM | POA: Diagnosis not present

## 2021-01-10 DIAGNOSIS — Z794 Long term (current) use of insulin: Secondary | ICD-10-CM | POA: Diagnosis not present

## 2021-01-10 DIAGNOSIS — Z01812 Encounter for preprocedural laboratory examination: Secondary | ICD-10-CM | POA: Insufficient documentation

## 2021-01-10 DIAGNOSIS — Z9049 Acquired absence of other specified parts of digestive tract: Secondary | ICD-10-CM | POA: Diagnosis not present

## 2021-01-10 DIAGNOSIS — Z880 Allergy status to penicillin: Secondary | ICD-10-CM | POA: Diagnosis not present

## 2021-01-10 DIAGNOSIS — Z86010 Personal history of colonic polyps: Secondary | ICD-10-CM | POA: Diagnosis not present

## 2021-01-10 DIAGNOSIS — Z79891 Long term (current) use of opiate analgesic: Secondary | ICD-10-CM | POA: Diagnosis not present

## 2021-01-10 DIAGNOSIS — K5730 Diverticulosis of large intestine without perforation or abscess without bleeding: Secondary | ICD-10-CM | POA: Diagnosis not present

## 2021-01-10 DIAGNOSIS — Z20822 Contact with and (suspected) exposure to covid-19: Secondary | ICD-10-CM | POA: Insufficient documentation

## 2021-01-10 DIAGNOSIS — Z7901 Long term (current) use of anticoagulants: Secondary | ICD-10-CM | POA: Diagnosis not present

## 2021-01-10 DIAGNOSIS — Z87891 Personal history of nicotine dependence: Secondary | ICD-10-CM | POA: Diagnosis not present

## 2021-01-10 DIAGNOSIS — Z1211 Encounter for screening for malignant neoplasm of colon: Secondary | ICD-10-CM | POA: Diagnosis not present

## 2021-01-10 DIAGNOSIS — Z79899 Other long term (current) drug therapy: Secondary | ICD-10-CM | POA: Diagnosis not present

## 2021-01-10 DIAGNOSIS — Z923 Personal history of irradiation: Secondary | ICD-10-CM | POA: Diagnosis not present

## 2021-01-10 DIAGNOSIS — Z980 Intestinal bypass and anastomosis status: Secondary | ICD-10-CM | POA: Diagnosis not present

## 2021-01-10 LAB — SARS CORONAVIRUS 2 (TAT 6-24 HRS): SARS Coronavirus 2: NEGATIVE

## 2021-01-11 ENCOUNTER — Encounter: Payer: Self-pay | Admitting: General Surgery

## 2021-01-12 ENCOUNTER — Other Ambulatory Visit: Payer: Self-pay

## 2021-01-12 ENCOUNTER — Ambulatory Visit: Payer: Federal, State, Local not specified - PPO | Admitting: Certified Registered Nurse Anesthetist

## 2021-01-12 ENCOUNTER — Encounter: Payer: Self-pay | Admitting: General Surgery

## 2021-01-12 ENCOUNTER — Ambulatory Visit
Admission: RE | Admit: 2021-01-12 | Discharge: 2021-01-12 | Disposition: A | Discharge status: Home / Self Care | Payer: Federal, State, Local not specified - PPO | Attending: General Surgery | Admitting: General Surgery

## 2021-01-12 ENCOUNTER — Encounter
Admission: RE | Disposition: A | Discharge status: Home / Self Care | Payer: Self-pay | Source: Home / Self Care | Attending: General Surgery

## 2021-01-12 DIAGNOSIS — Z9049 Acquired absence of other specified parts of digestive tract: Secondary | ICD-10-CM | POA: Insufficient documentation

## 2021-01-12 DIAGNOSIS — Z1211 Encounter for screening for malignant neoplasm of colon: Secondary | ICD-10-CM | POA: Insufficient documentation

## 2021-01-12 DIAGNOSIS — Z7984 Long term (current) use of oral hypoglycemic drugs: Secondary | ICD-10-CM | POA: Insufficient documentation

## 2021-01-12 DIAGNOSIS — Z923 Personal history of irradiation: Secondary | ICD-10-CM | POA: Insufficient documentation

## 2021-01-12 DIAGNOSIS — K5730 Diverticulosis of large intestine without perforation or abscess without bleeding: Secondary | ICD-10-CM | POA: Insufficient documentation

## 2021-01-12 DIAGNOSIS — Z79891 Long term (current) use of opiate analgesic: Secondary | ICD-10-CM | POA: Insufficient documentation

## 2021-01-12 DIAGNOSIS — Z79890 Hormone replacement therapy (postmenopausal): Secondary | ICD-10-CM | POA: Insufficient documentation

## 2021-01-12 DIAGNOSIS — Z880 Allergy status to penicillin: Secondary | ICD-10-CM | POA: Insufficient documentation

## 2021-01-12 DIAGNOSIS — Z79899 Other long term (current) drug therapy: Secondary | ICD-10-CM | POA: Insufficient documentation

## 2021-01-12 DIAGNOSIS — Z794 Long term (current) use of insulin: Secondary | ICD-10-CM | POA: Insufficient documentation

## 2021-01-12 DIAGNOSIS — Z87891 Personal history of nicotine dependence: Secondary | ICD-10-CM | POA: Insufficient documentation

## 2021-01-12 DIAGNOSIS — Z7901 Long term (current) use of anticoagulants: Secondary | ICD-10-CM | POA: Insufficient documentation

## 2021-01-12 DIAGNOSIS — Z20822 Contact with and (suspected) exposure to covid-19: Secondary | ICD-10-CM | POA: Insufficient documentation

## 2021-01-12 DIAGNOSIS — Z980 Intestinal bypass and anastomosis status: Secondary | ICD-10-CM | POA: Insufficient documentation

## 2021-01-12 DIAGNOSIS — Z86010 Personal history of colonic polyps: Secondary | ICD-10-CM | POA: Insufficient documentation

## 2021-01-12 HISTORY — PX: COLONOSCOPY WITH PROPOFOL: SHX5780

## 2021-01-12 LAB — GLUCOSE, CAPILLARY: Glucose-Capillary: 162 mg/dL — ABNORMAL HIGH (ref 70–99)

## 2021-01-12 SURGERY — COLONOSCOPY WITH PROPOFOL
Anesthesia: General

## 2021-01-12 MED ORDER — LIDOCAINE HCL (PF) 2 % IJ SOLN
INTRAMUSCULAR | Status: AC
  Filled 2021-01-12: qty 5

## 2021-01-12 MED ORDER — PROPOFOL 500 MG/50ML IV EMUL
INTRAVENOUS | Status: AC
  Filled 2021-01-12: qty 50

## 2021-01-12 MED ORDER — PROPOFOL 10 MG/ML IV BOLUS
INTRAVENOUS | Status: DC | PRN
  Administered 2021-01-12: 60 mg via INTRAVENOUS

## 2021-01-12 MED ORDER — PROPOFOL 500 MG/50ML IV EMUL
INTRAVENOUS | Status: DC | PRN
  Administered 2021-01-12: 150 ug/kg/min via INTRAVENOUS

## 2021-01-12 MED ORDER — EPHEDRINE 5 MG/ML INJ
INTRAVENOUS | Status: AC
  Filled 2021-01-12: qty 10

## 2021-01-12 MED ORDER — EPHEDRINE SULFATE 50 MG/ML IJ SOLN
INTRAMUSCULAR | Status: DC | PRN
  Administered 2021-01-12: 5 mg via INTRAVENOUS
  Administered 2021-01-12 (×2): 10 mg via INTRAVENOUS

## 2021-01-12 MED ORDER — LIDOCAINE HCL (CARDIAC) PF 100 MG/5ML IV SOSY
PREFILLED_SYRINGE | INTRAVENOUS | Status: DC | PRN
  Administered 2021-01-12: 50 mg via INTRAVENOUS

## 2021-01-12 MED ORDER — SODIUM CHLORIDE 0.9 % IV SOLN: INTRAVENOUS | Status: DC

## 2021-01-12 NOTE — Op Note (Signed)
[facility] Patient Name: Stephen Yu Procedure Date: 01/12/2021 8:02 AM MRN: [id number] Account #: [id number] Date of Birth: 18-Apr-1956 Admit Type: Outpatient Age: 65 Room: [facility] ENDO ROOM 1 Gender: Male Note Status: Finalized Procedure:             Colonoscopy Indications:           High risk colon cancer surveillance: Personal history                         of colonic polyps Providers:             Tallin Bellow, MD Medicines:             Monitored Anesthesia Care Complications:         No immediate complications. Procedure:             Pre-Anesthesia Assessment:                        - Prior to the procedure, a History and Physical was                         performed, and patient medications, allergies and                         sensitivities were reviewed. The patient's tolerance                         of previous anesthesia was reviewed.                        - The risks and benefits of the procedure and the                         sedation options and risks were discussed with the                         patient. All questions were answered and informed                         consent was obtained.                        After obtaining informed consent, the colonoscope was                         passed under direct vision. Throughout the procedure,                         the patient's blood pressure, pulse, and oxygen                         saturations were monitored continuously. The                         Colonoscope was introduced through the anus and                         advanced to the the ileocolonic anastomosis. The  colonoscopy was performed without difficulty. The                         patient tolerated the procedure well. The quality of                         the bowel preparation was good. Findings:      A few medium-mouthed diverticula were found in the sigmoid colon.      The  retroflexed view of the distal rectum and anal verge was normal and       showed no anal or rectal abnormalities. Impression:            - Diverticulosis in the sigmoid colon.                        - The distal rectum and anal verge are normal on                         retroflexion view.                        - No specimens collected. Recommendation:        - Repeat colonoscopy in 5 years for surveillance. Procedure Code(s):     --- Professional ---                        [phone], Colonoscopy, flexible; diagnostic, including                         collection of specimen(s) by brushing or washing, when                         performed (separate procedure) Diagnosis Code(s):     --- Professional ---                        Z86.010, Personal history of colonic polyps                        K57.30, Diverticulosis of large intestine without                         perforation or abscess without bleeding CPT copyright 2019 American Medical Association. All rights reserved. The codes documented in this report are preliminary and upon coder review may  be revised to meet current compliance requirements. [name], MD 01/12/2021 8:25:16 AM This report has been signed electronically. Number of Addenda: 0 Note Initiated On: 01/12/2021 8:02 AM Scope Withdrawal Time: 0 hours 7 minutes 37 seconds  Total Procedure Duration: 0 hours 13 minutes 10 seconds  Estimated Blood Loss:  Estimated blood loss: none.      [facility]

## 2021-01-12 NOTE — H&P (Signed)
Stephen Yu [id number] 18-Feb-1956     HPI:  Patient s/p right colectomy for TA with high grade dysplasia.  Six tubular adenomas at the time of his May 2018 colonoscopy. For repeat exam. Tolerated the prep well. Off Eliquis x 2 days as requested.   Medications Prior to Admission  Medication Sig Dispense Refill Last Dose  . diltiazem (CARDIZEM CD) 120 MG 24 hr capsule Take 120 mg by mouth in the morning and at bedtime.    01/12/2021 at 0500  . apixaban (ELIQUIS) 5 MG TABS tablet Take 5 mg by mouth 2 (two) times daily.    01/09/2021  . cetirizine (ZYRTEC) 10 MG tablet Take 10 mg by mouth at bedtime.     . Cholecalciferol (VITAMIN D3) 5000 units TABS Take 5,000 Units by mouth daily.      . citalopram (CELEXA) 40 MG tablet Take 40 mg by mouth every morning.      . Continuous Blood Gluc Sensor (FREESTYLE LIBRE 14 DAY SENSOR) MISC USE 1 KIT UTD  11   . fluticasone (FLONASE) 50 MCG/ACT nasal spray Place 2 sprays into both nostrils daily as needed for allergies.     Marland Kitchen HYDROcodone-acetaminophen (NORCO) 7.5-325 MG tablet Take 1 tablet by mouth every 6 (six) hours as needed for moderate pain.      . Insulin Glargine (BASAGLAR KWIKPEN) 100 UNIT/ML SOPN Inject 60 Units into the skin at bedtime.     . Insulin Pen Needle 32G X 4 MM MISC USE EVERY DAY AS NEEDED     . lisinopril (ZESTRIL) 10 MG tablet Take 10 mg by mouth daily.     . metFORMIN (GLUCOPHAGE) 500 MG tablet Take 500 mg by mouth 2 (two) times daily with a meal.     . MORPHINE SULFATE IJ Inject as directed. Morphine 67m/ml epidural pump. 1.4897 mg/day     . Multiple Vitamins-Minerals (PRESERVISION AREDS 2 PO) Take 1 capsule by mouth in the morning and at bedtime.      .Marland Kitchenoxymetazoline (AFRIN) 0.05 % nasal spray Place 1 spray into both nostrils 2 (two) times daily as needed for congestion.     . pregabalin (LYRICA) 150 MG capsule Take 150 mg by mouth 2 (two) times daily.     . rosuvastatin (CRESTOR) 10 MG tablet Take 10 mg by mouth daily.   3   .  Semaglutide (OZEMPIC, 0.25 OR 0.5 MG/DOSE, ) Inject 1 mg into the skin once a week.      . testosterone cypionate (DEPOTESTOSTERONE CYPIONATE) 200 MG/ML injection Inject 1 mL (200 mg total) into the muscle every 14 (fourteen) days. 3 mL 0   . tiZANidine (ZANAFLEX) 4 MG tablet Take 4 mg by mouth every 8 (eight) hours as needed for muscle spasms.     . traMADol (ULTRAM-ER) 300 MG 24 hr tablet Take 300 mg by mouth at bedtime.      . traZODone (DESYREL) 50 MG tablet Take 50 mg by mouth at bedtime.     . vitamin E 180 MG (400 UNITS) capsule Take 400 Units by mouth in the morning and at bedtime.     .Marland Kitchenzinc gluconate 50 MG tablet Take 50 mg by mouth daily.      Allergies  Allergen Reactions  . Biaxin [Clarithromycin] Other (See Comments)    Severe hiccups  . Ceftin [Cefuroxime Axetil] Other (See Comments)    Severe hiccups  . Penicillins Hives    Has patient had a PCN reaction causing immediate rash,  facial/tongue/throat swelling, SOB or lightheadedness with hypotension: No Has patient had a PCN reaction causing severe rash involving mucus membranes or skin necrosis: unknown Has patient had a PCN reaction that required hospitalization: No Has patient had a PCN reaction occurring within the last 10 years: No If all of the above answers are "NO", then may proceed with Cephalosporin use.    Past Medical History:  Diagnosis Date  . Anxiety   . Arthritis   . Cancer ([place]) 08/21/2017   [facility]  . Chronic pain 1996   due to work injury  . Chronic prescription opiate use   . Colon polyp    adenomatous polyp ascending colon  . Cough    THAT WONT GO AWAY-PT TO SEE DR Doy Hutching ON 06-10-17 @ 4 PM  . CTS (carpal tunnel syndrome)    bilateral  . Depression   . Diabetes mellitus without complication ([place])   . Dysrhythmia    AFIB X 1  . GERD (gastroesophageal reflux disease)   . Herniated lumbar intervertebral disc   . Hypertension   . Hypothyroidism   . Long-term current use of testosterone  cypionate   . Neck pain   . Neuromuscular disorder ([place])   . Presence of intrathecal pump    Dr. Mechele Dawley in [place], [place]  . Vertigo    Avg: 2x/month   Past Surgical History:  Procedure Laterality Date  . CARPAL TUNNEL RELEASE Left 09/25/2020   Procedure: CARPAL TUNNEL RELEASE;  Surgeon: Dereck Leep, MD;  Location: [facility] ORS;  Service: Orthopedics;  Laterality: Left;  . CARPAL TUNNEL RELEASE Right 11/27/2020   Procedure: CARPAL TUNNEL RELEASE;  Surgeon: Dereck Leep, MD;  Location: [facility] ORS;  Service: Orthopedics;  Laterality: Right;  . CATARACT EXTRACTION W/PHACO Right 05/30/2020   Procedure: CATARACT EXTRACTION PHACO AND INTRAOCULAR LENS PLACEMENT (IOC) RIGHT DIABETIC 2.83 00:35.8;  Surgeon: Birder Robson, MD;  Location: [facility];  Service: Ophthalmology;  Laterality: Right;  Diabetic - insulin and oral meds  . COLON SURGERY    . COLONOSCOPY  2013   [place]  . COLONOSCOPY WITH PROPOFOL N/A 04/22/2017   Procedure: COLONOSCOPY WITH PROPOFOL;  Surgeon: Lollie Sails, MD;  Location: [facility] ENDOSCOPY;  Service: Endoscopy;  Laterality: N/A;  . HERNIA REPAIR    . IMAGE GUIDED SINUS SURGERY    . implanted morphine pump  2016  . IR IMAGING GUIDED PORT INSERTION  09/26/2017  . IR REMOVAL TUN ACCESS W/ PORT W/O FL MOD SED  02/11/2018  . LAPAROSCOPIC CHOLECYSTECTOMY  2013  . PARTIAL COLECTOMY Right 06/17/2017   Procedure: PARTIAL COLECTOMY;  Surgeon: Christene Lye, MD;  Location: [facility] ORS;  Service: General;  Laterality: Right;  . UMBILICAL HERNIA REPAIR  2013  . UMBILICAL HERNIA REPAIR  06/17/2017   Procedure: HERNIA REPAIR UMBILICAL ADULT;  Surgeon: Christene Lye, MD;  Location: [facility] ORS;  Service: General;;  . VASECTOMY     Social History   Socioeconomic History  . Marital status: Married    Spouse name: Not on file  . Number of children: Not on file  . Years of education: Not on file  . Highest education level: Not on file   Occupational History  . Not on file  Tobacco Use  . Smoking status: Former Smoker    Years: 20.00    Types: Cigarettes    Quit date: 12/03/1999    Years since quitting: 21.1  . Smokeless tobacco: Never Used  Vaping Use  .  Vaping Use: Never used  Substance and Sexual Activity  . Alcohol use: No  . Drug use: No  . Sexual activity: Yes  Other Topics Concern  . Not on file  Social History Narrative  . Not on file   Social Determinants of Health   Financial Resource Strain: Not on file  Food Insecurity: Not on file  Transportation Needs: Not on file  Physical Activity: Not on file  Stress: Not on file  Social Connections: Not on file  Intimate Partner Violence: Not on file   Social History   Social History Narrative  . Not on file     ROS: Negative.     PE: HEENT: Negative. Lungs: Clear. Cardio: RR. (Sinus on clinical exam).   Assessment/Plan:  Proceed with planned endoscopy.   Forest Gleason [facility] 01/12/2021

## 2021-01-12 NOTE — Anesthesia Preprocedure Evaluation (Signed)
Anesthesia Evaluation  Patient identified by MRN, date of birth, ID band Patient awake    Reviewed: Allergy & Precautions, NPO status , Patient's Chart, lab work & pertinent test results  History of Anesthesia Complications Negative for: history of anesthetic complications  Airway Mallampati: II  TM Distance: >3 FB Neck ROM: Full    Dental  (+) Poor Dentition, Dental Advisory Given   Pulmonary neg pulmonary ROS, neg sleep apnea, neg COPD, Patient abstained from smoking.Not current smoker, former smoker,    Pulmonary exam normal breath sounds clear to auscultation       Cardiovascular Exercise Tolerance: Good METShypertension, (-) CAD and (-) Past MI + dysrhythmias Atrial Fibrillation  Rhythm:Irregular Rate:Normal - Systolic murmurs    Neuro/Psych PSYCHIATRIC DISORDERS Anxiety Depression In-situ morphine pump for back pain On chronic opioids as well  Neuromuscular disease    GI/Hepatic GERD  Controlled,(+)     (-) substance abuse  ,   Endo/Other  diabetesHypothyroidism   Renal/GU negative Renal ROS     Musculoskeletal  (+) Arthritis , Osteoarthritis,    Abdominal   Peds  Hematology 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; no excessive neck extension restriction, but does have chronic dry mouth   Anesthesia Other Findings Past Medical History: No date: Anxiety No date: Arthritis 08/21/2017: Cancer ([place]) 1996: Chronic pain     Comment:  due to work injury No date: Colon polyp     Comment:  adenomatous polyp ascending colon No date: Cough     Comment:  THAT WONT GO AWAY-PT TO SEE DR Doy Hutching ON 06-10-17 @ 4 PM No date: CTS (carpal tunnel syndrome)     Comment:  bilateral No date: Depression No date: Diabetes mellitus without complication (HCC) No date: Dysrhythmia     Comment:  AFIB X 1 No date: GERD (gastroesophageal reflux disease) No date: Herniated lumbar intervertebral disc No date: Hypertension No date:  Hypothyroidism No date: Neck pain No date: Neuromuscular disorder (HCC) No date: Presence of intrathecal pump     Comment:  Dr. Mechele Dawley in [place], [place] No date: Vertigo     Comment:  Avg: 2x/month  Reproductive/Obstetrics                             Anesthesia Physical  Anesthesia Plan  ASA: III  Anesthesia Plan: General   Post-op Pain Management:    Induction: Intravenous  PONV Risk Score and Plan: 2 and Ondansetron, Propofol infusion and TIVA  Airway Management Planned: Natural Airway  Additional Equipment: None  Intra-op Plan:   Post-operative Plan: Extubation in OR  Informed Consent: I have reviewed the patients History and Physical, chart, labs and discussed the procedure including the risks, benefits and alternatives for the proposed anesthesia with the patient or authorized representative who has indicated his/her understanding and acceptance.     Dental advisory given  Plan Discussed with: CRNA and Surgeon  Anesthesia Plan Comments: (Discussed risks of anesthesia with patient, including PONV, sore throat, lip/dental damage. Rare risks discussed as well, such as cardiorespiratory and neurological sequelae. Patient understands.)        Anesthesia Quick Evaluation

## 2021-01-12 NOTE — Transfer of Care (Signed)
Immediate Anesthesia Transfer of Care Note  Patient: Stephen Yu  Procedure(s) Performed: COLONOSCOPY WITH PROPOFOL (N/A )  Patient Location: Endoscopy Unit  Anesthesia Type:General  Level of Consciousness: drowsy  Airway & Oxygen Therapy: Patient Spontanous Breathing  Post-op Assessment: Report given to RN and Post -op Vital signs reviewed and stable  Post vital signs: Reviewed and stable  Last Vitals:  Vitals Value Taken Time  BP 105/83 01/12/21 0830  Temp    Pulse 87 01/12/21 0830  Resp 13 01/12/21 0830  SpO2 89 % 01/12/21 0830  Vitals shown include unvalidated device data.  Last Pain:  Vitals:   01/12/21 0732  TempSrc: Temporal  PainSc: 0-No pain         Complications: No complications documented.

## 2021-01-12 NOTE — Anesthesia Postprocedure Evaluation (Signed)
Anesthesia Post Note  Patient: Stephen Yu  Procedure(s) Performed: COLONOSCOPY WITH PROPOFOL (N/A )  Patient location during evaluation: Endoscopy Anesthesia Type: General Level of consciousness: awake and alert Pain management: pain level controlled Vital Signs Assessment: post-procedure vital signs reviewed and stable Respiratory status: spontaneous breathing, nonlabored ventilation, respiratory function stable and patient connected to nasal cannula oxygen Cardiovascular status: blood pressure returned to baseline and stable Postop Assessment: no apparent nausea or vomiting Anesthetic complications: no   No complications documented.   Last Vitals:  Vitals:   01/12/21 0850 01/12/21 0856  BP:  112/68  Pulse: 81 76  Resp: 14 10  Temp:    SpO2: 99% 98%    Last Pain:  Vitals:   01/12/21 0732  TempSrc: Temporal  PainSc: 0-No pain                 Arita Miss

## 2021-03-26 ENCOUNTER — Ambulatory Visit (INDEPENDENT_AMBULATORY_CARE_PROVIDER_SITE_OTHER): Payer: Federal, State, Local not specified - PPO | Admitting: Internal Medicine

## 2021-03-26 VITALS — BP 115/72 | HR 76 | Temp 99.5°F | Resp 18 | Ht 70.0 in | Wt 213.0 lb

## 2021-03-26 DIAGNOSIS — I10 Essential (primary) hypertension: Secondary | ICD-10-CM | POA: Diagnosis not present

## 2021-03-26 DIAGNOSIS — K219 Gastro-esophageal reflux disease without esophagitis: Secondary | ICD-10-CM | POA: Insufficient documentation

## 2021-03-26 DIAGNOSIS — I499 Cardiac arrhythmia, unspecified: Secondary | ICD-10-CM | POA: Insufficient documentation

## 2021-03-26 DIAGNOSIS — G4733 Obstructive sleep apnea (adult) (pediatric): Secondary | ICD-10-CM | POA: Diagnosis not present

## 2021-03-26 DIAGNOSIS — I480 Paroxysmal atrial fibrillation: Secondary | ICD-10-CM

## 2021-03-26 NOTE — Patient Instructions (Signed)

## 2021-03-26 NOTE — Progress Notes (Signed)
Sleep Medicine   Office Visit  Patient Name: Stephen Yu DOB: 1956-02-01 MRN [id number]    Chief Complaint: sleep apnea/initial consultation  Brief History:  Stephen Yu Was seen  to discuss the results of his recent sleep study(ies)  Sleep quality is poor due to 26 year history of back pain.  Patient said he has morphine pump on left side and take oral meds to manage pain.  . This is noted most nights. The patient's bed partner reports  Snoring and dry mouth at night. The patient relates the following symptoms: nocturia are also present. The patient goes to sleep at 9pm (does note inconsistent problems with falling asleep and wakes up at 9-10am .  he reports that his sleep quality is poor.  Sleep quality is poor when outside home environment.  Patient has noted no restlessness of his legs at night.  The patient  relates restless  behavior during the night due to pain and dry mouth.  The patient has  a history of anxiety and claustrophobia well managed with lexapro. The Epworth Sleepiness Score is 5 out of 24 .  The patient relates  Cardiovascular risk factors include: atrial fibrillation. He has an intrathecal pump for back pain, morphine.      ROS  General: (-) fever, (-) chills, (-) night sweat Nose and Sinuses: (-) nasal stuffiness or itchiness, (-) postnasal drip, (-) nosebleeds, (-) sinus trouble. Mouth and Throat: (-) sore throat, (-) hoarseness. Neck: (-) swollen glands, (-) enlarged thyroid, (-) neck pain. Respiratory: - cough, - shortness of breath, - wheezing. Neurologic: - numbness, - tingling. Psychiatric: + anxiety, - depression Sleep behavior: -sleep paralysis -hypnogogic hallucinations -dream enactment      -vivid dreams -cataplexy -night terrors -sleep walking   Current Medication: Outpatient Encounter Medications as of 03/26/2021  Medication Sig Note  . apixaban (ELIQUIS) 5 MG TABS tablet Take 1 tablet by mouth 2 (two) times daily.   . citalopram (CELEXA) 40 MG  tablet Take 1 tablet by mouth daily.   Marland Kitchen HYDROcodone-acetaminophen (NORCO) 7.5-325 MG tablet Take by mouth.   . Insulin Glargine (BASAGLAR KWIKPEN) 100 UNIT/ML Inject into the skin.   Marland Kitchen lisinopril (ZESTRIL) 10 MG tablet Take 1 tablet by mouth daily.   . metFORMIN (GLUCOPHAGE) 500 MG tablet Take 1 tablet by mouth 2 (two) times daily with a meal.   . pregabalin (LYRICA) 150 MG capsule Take by mouth.   . rosuvastatin (CRESTOR) 10 MG tablet Take 1 tablet by mouth daily.   . Semaglutide, 1 MG/DOSE, (OZEMPIC, 1 MG/DOSE,) 4 MG/3ML SOPN Inject into the skin.   Marland Kitchen traZODone (DESYREL) 50 MG tablet Take 1 tablet by mouth at bedtime.   Marland Kitchen apixaban (ELIQUIS) 5 MG TABS tablet Take 5 mg by mouth 2 (two) times daily.    . cetirizine (ZYRTEC) 10 MG tablet Take 10 mg by mouth at bedtime.   . Cholecalciferol (VITAMIN D3) 5000 units TABS Take 5,000 Units by mouth daily.    . citalopram (CELEXA) 40 MG tablet Take 40 mg by mouth every morning.    . Continuous Blood Gluc Sensor (FREESTYLE LIBRE 14 DAY SENSOR) MISC USE 1 KIT UTD   . diltiazem (CARDIZEM CD) 120 MG 24 hr capsule Take 120 mg by mouth in the morning and at bedtime.    . fluticasone (FLONASE) 50 MCG/ACT nasal spray Place 2 sprays into both nostrils daily as needed for allergies.   Marland Kitchen HYDROcodone-acetaminophen (NORCO) 7.5-325 MG tablet Take 1 tablet by mouth every 6 (  six) hours as needed for moderate pain.    . Insulin Glargine (BASAGLAR KWIKPEN) 100 UNIT/ML SOPN Inject 60 Units into the skin at bedtime.   . Insulin Pen Needle 32G X 4 MM MISC USE EVERY DAY AS NEEDED   . lisinopril (ZESTRIL) 10 MG tablet Take 10 mg by mouth daily.   . metFORMIN (GLUCOPHAGE) 500 MG tablet Take 500 mg by mouth 2 (two) times daily with a meal.   . MORPHINE SULFATE IJ Inject as directed. Morphine 90m/ml epidural pump. 1.4897 mg/day 09/25/2020: Continuos infusion.  . Multiple Vitamins-Minerals (PRESERVISION AREDS 2 PO) Take 1 capsule by mouth in the morning and at bedtime.    .Marland Kitchen oxymetazoline (AFRIN) 0.05 % nasal spray Place 1 spray into both nostrils 2 (two) times daily as needed for congestion.   . pregabalin (LYRICA) 150 MG capsule Take 150 mg by mouth 2 (two) times daily.   . rosuvastatin (CRESTOR) 10 MG tablet Take 10 mg by mouth daily.    . Semaglutide (OZEMPIC, 0.25 OR 0.5 MG/DOSE, [place]) Inject 1 mg into the skin once a week.  11/16/2020: Wednesdays   . testosterone cypionate (DEPOTESTOSTERONE CYPIONATE) 200 MG/ML injection Inject 1 mL (200 mg total) into the muscle every 14 (fourteen) days.   .Marland KitchentiZANidine (ZANAFLEX) 4 MG tablet Take 4 mg by mouth every 8 (eight) hours as needed for muscle spasms.   . traMADol (ULTRAM-ER) 300 MG 24 hr tablet Take 300 mg by mouth at bedtime.    . traZODone (DESYREL) 50 MG tablet Take 50 mg by mouth at bedtime.   . vitamin E 180 MG (400 UNITS) capsule Take 400 Units by mouth in the morning and at bedtime.   .Marland Kitchenzinc gluconate 50 MG tablet Take 50 mg by mouth daily.    Facility-Administered Encounter Medications as of 03/26/2021  Medication  . 0.9 %  sodium chloride infusion  . heparin lock flush 100 unit/mL    Surgical History: Past Surgical History:  Procedure Laterality Date  . CARPAL TUNNEL RELEASE Left 09/25/2020   Procedure: CARPAL TUNNEL RELEASE;  Surgeon: HDereck Leep MD;  Location: [facility];  Service: Orthopedics;  Laterality: Left;  . CARPAL TUNNEL RELEASE Right 11/27/2020   Procedure: CARPAL TUNNEL RELEASE;  Surgeon: HDereck Leep MD;  Location: [facility];  Service: Orthopedics;  Laterality: Right;  . CATARACT EXTRACTION W/PHACO Right 05/30/2020   Procedure: CATARACT EXTRACTION PHACO AND INTRAOCULAR LENS PLACEMENT (IOC) RIGHT DIABETIC 2.83 00:35.8;  Surgeon: PBirder Robson MD;  [facility]  Service: Ophthalmology;  Laterality: Right;  Diabetic - insulin and oral meds  . COLON SURGERY    . COLONOSCOPY  2013   [place]  . COLONOSCOPY WITH PROPOFOL N/A 04/22/2017   Procedure: COLONOSCOPY WITH  PROPOFOL;  Surgeon: SLollie Sails MD;  [facility];  Service: Endoscopy;  Laterality: N/A;  . COLONOSCOPY WITH PROPOFOL N/A 01/12/2021   Procedure: COLONOSCOPY WITH PROPOFOL;  Surgeon: BRobert Bellow MD;  Location: [facility];  Service: General;  Laterality: N/A;  . HERNIA REPAIR    . IMAGE GUIDED SINUS SURGERY    . implanted morphine pump  2016  . IR IMAGING GUIDED PORT INSERTION  09/26/2017  . IR REMOVAL TUN ACCESS W/ PORT W/O FL MOD SED  02/11/2018  . LAPAROSCOPIC CHOLECYSTECTOMY  2013  . PARTIAL COLECTOMY Right 06/17/2017   Procedure: PARTIAL COLECTOMY;  Surgeon: SChristene Lye MD;  Location: [facility];  Service: General;  Laterality: Right;  . UMBILICAL HERNIA  REPAIR  2013  . UMBILICAL HERNIA REPAIR  06/17/2017   Procedure: HERNIA REPAIR UMBILICAL ADULT;  Surgeon: Christene Lye, MD;  Location: [facility];  Service: General;;  . VASECTOMY      Medical History: Past Medical History:  Diagnosis Date  . Anxiety   . Arthritis   . 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 Chronic pain 1996   due to work injury  . Chronic prescription opiate use   . Colon polyp    adenomatous polyp ascending colon  . Cough    THAT WONT GO AWAY-PT TO SEE DR Doy Hutching ON 06-10-17 @ 4 PM  . CTS (carpal tunnel syndrome)    bilateral  . Depression   . Diabetes mellitus without complication ([place])   . Dysrhythmia    AFIB X 1  . GERD (gastroesophageal reflux disease)   . Herniated lumbar intervertebral disc   . Hypertension   . Hypothyroidism   . Long-term current use of testosterone cypionate   . Neck pain   . Neuromuscular disorder ([place])   . Presence of intrathecal pump    Dr. Mechele Dawley in [place], [place]  . Vertigo    Avg: 2x/month    Family History: Non contributory to the present illness  Social History: Social History   Socioeconomic History  . Marital status: Married    Spouse name: Not on file  . Number of children: Not on file  .  Years of education: Not on file  . Highest education level: Not on file  Occupational History  . Not on file  Tobacco Use  . Smoking status: Former Smoker    Years: 20.00    Types: Cigarettes    Quit date: 12/03/1999    Years since quitting: 21.3  . Smokeless tobacco: Never Used  Vaping Use  . Vaping Use: Never used  Substance and Sexual Activity  . Alcohol use: No  . Drug use: No  . Sexual activity: Yes  Other Topics Concern  . Not on file  Social History Narrative  . Not on file   Social Determinants of Health   Financial Resource Strain: Not on file  Food Insecurity: Not on file  Transportation Needs: Not on file  Physical Activity: Not on file  Stress: Not on file  Social Connections: Not on file  Intimate Partner Violence: Not on file    Vital Signs: Blood pressure 115/72, pulse 76, temperature 99.5 F (37.5 C), temperature source Temporal, resp. rate 18, height _0  (1.778 m), weight 213 lb (96.6 kg), SpO2 96 %.  Examination: General Appearance: The patient is well-developed, well-nourished, and in no distress. Neck Circumference: 39 Skin: Gross inspection of skin unremarkable. Head: normocephalic, no gross deformities. Eyes: no gross deformities noted. ENT: ears appear grossly normal Neurologic: Alert and oriented. No involuntary movements.    EPWORTH SLEEPINESS SCALE:  Scale:  (0)= no chance of dozing; (1)= slight chance of dozing; (2)= moderate chance of dozing; (3)= high chance of dozing  Chance  Situtation    Sitting and reading: 1    Watching TV: 0    Sitting Inactive in public: 1    As a passenger in car: 0      Lying down to rest: 2    Sitting and talking: 0    Sitting quielty after lunch: 1    In a car, stopped in traffic: 0   TOTAL SCORE:   5 out of 24    SLEEP STUDIES:  1. PSG 02/08/21 AHI 63,  low SpO2 71%, BiPAP @ 17/12cmH2O x 10bpm   LABS: Recent Results (from the past 2160 hour(s))  SARS CORONAVIRUS 2 (TAT 6-24  HRS) Nasopharyngeal Nasopharyngeal Swab     Status: None   Collection Time: 01/10/21 11:27 AM   Specimen: Nasopharyngeal Swab  Result Value Ref Range   SARS Coronavirus 2 NEGATIVE NEGATIVE    Comment: (NOTE) SARS-CoV-2 target nucleic acids are NOT DETECTED.  The SARS-CoV-2 RNA is generally detectable in upper and lower respiratory specimens during the acute phase of infection. Negative results do not preclude SARS-CoV-2 infection, do not rule out co-infections with other pathogens, and should not be used as the sole basis for treatment or other patient management decisions. Negative results must be combined with clinical observations, patient history, and epidemiological information. The expected result is Negative.  Fact Sheet for Patients: SugarRoll.be  Fact Sheet for Healthcare Providers: https://www.woods-mathews.com/  This test is not yet approved or cleared by the [place] FDA and  has been authorized for detection and/or diagnosis of SARS-CoV-2 by FDA under an Emergency Use Authorization (EUA). This EUA will remain  in effect (meaning this test can be used) for the duration of the COVID-19 declaration under Se ction 564(b)(1) of the Act, 21 U.S.C. section 360bbb-3(b)(1), unless the authorization is terminated or revoked sooner.  Performed at [facility], [place] [address], [place], [place] [address]   Glucose, capillary     Status: Abnormal   Collection Time: 01/12/21  7:32 AM  Result Value Ref Range   Glucose-Capillary 162 (H) 70 - 99 mg/dL    Comment: Glucose reference range applies only to samples taken after fasting for at least 8 hours.    Radiology: No results found.  No results found.  No results found.    Assessment and Plan: Patient Active Problem List   Diagnosis Date Noted  . Hypogonadism male 11/01/2019  . DM type 2 with diabetic peripheral neuropathy ([place]) 06/11/2019  . Uncontrolled type 2  diabetes mellitus with hyperglycemia ([place]) 06/11/2019  . Hyperlipidemia, mixed 04/22/2018  . Dehydration 12/01/2017  . Atrial flutter, paroxysmal ([place]) 10/14/2017  . Paroxysmal A-fib ([place]) 10/13/2017  . SOBOE (shortness of breath on exertion) 10/13/2017  . 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 Goals of care, counseling/discussion 09/17/2017  . Polyp of transverse colon 06/17/2017  . Tonsillar hypertrophy 04/30/2017  . Acquired hypothyroidism 10/17/2016  . Colorectal polyps 10/17/2016  . Diabetic sensorimotor neuropathy ([place]) 10/17/2016  . HTN, goal below 140/80 10/17/2016  . Pain syndrome, chronic 10/17/2016   1. OSA (obstructive sleep apnea) PLAN OSA: Pt study shows severe sleep apnea. This was reviewed with him and his wife at length. It was advised he start treatment with bipap.  OSA- biPAP 17/12 cm H2O backup rate of 10bpm  will be ordered. Follow up 30+days after set up.    2. Hypertension, unspecified type Hypertension Counseling:   The following hypertensive lifestyle modification were recommended and discussed:  1. Limiting alcohol intake to less than 1 oz/day of ethanol:(24 oz of beer or 8 oz of wine or 2 oz of 100-proof whiskey). 2. Take baby ASA 81 mg daily. 3. Importance of regular aerobic exercise and losing weight. 4. Reduce dietary saturated fat and cholesterol intake for overall cardiovascular health. 5. Maintaining adequate dietary potassium, calcium, and magnesium intake. 6. Regular monitoring of the blood pressure. 7. Reduce sodium intake to less than 100 mmol/day (less than 2.3 gm of sodium or less than  6 gm of sodium choride)   3. Gastroesophageal reflux disease without esophagitis Controlled. Continue to avoid triggers.   4. Paroxysmal atrial fibrillation [facility]) He will continue f/u with cardiology. Advised as to risks of untreated apnea in light of dysrhythmia.      General Counseling: I have discussed the findings of the evaluation and examination  with Herbie Baltimore.  I have also discussed any further diagnostic evaluation thatmay be needed or ordered today. Vint verbalizes understanding of the findings of todays visit. We also reviewed his medications today and discussed drug interactions and side effects including but not limited excessive drowsiness and altered mental states. We also discussed that there is always a risk not just to him but also people around him. he has been encouraged to call the office with any questions or concerns that should arise related to todays visit.  No orders of the defined types were placed in this encounter.       I have personally obtained a history, evaluated the patient, evaluated pertinent data, formulated the assessment and plan and placed orders.  This patient was seen today by Tressie Ellis, PA-C in collaboration with Dr. Devona Konig.    Richelle Ito Saunders Glance, PhD, FAASM  Diplomate, American Board of Sleep Medicine    Allyne Gee, MD [facility] Diplomate ABMS Pulmonary and Critical Care Medicine Sleep medicine

## 2021-03-30 ENCOUNTER — Ambulatory Visit
Admission: RE | Admit: 2021-03-30 | Discharge: 2021-03-30 | Disposition: A | Discharge status: Home / Self Care | Payer: Federal, State, Local not specified - PPO | Source: Ambulatory Visit | Attending: Physician Assistant | Admitting: Physician Assistant

## 2021-03-30 ENCOUNTER — Other Ambulatory Visit: Payer: Self-pay | Admitting: Physician Assistant

## 2021-03-30 ENCOUNTER — Other Ambulatory Visit: Payer: Self-pay

## 2021-03-30 DIAGNOSIS — R600 Localized edema: Secondary | ICD-10-CM | POA: Diagnosis present

## 2021-04-06 ENCOUNTER — Encounter: Payer: Self-pay | Admitting: Oncology

## 2021-04-06 ENCOUNTER — Other Ambulatory Visit: Payer: Self-pay

## 2021-04-06 ENCOUNTER — Inpatient Hospital Stay: Payer: Federal, State, Local not specified - PPO | Admitting: Oncology

## 2021-04-06 ENCOUNTER — Inpatient Hospital Stay: Payer: Federal, State, Local not specified - PPO | Attending: Oncology

## 2021-04-06 VITALS — BP 140/78 | HR 80

## 2021-04-06 DIAGNOSIS — Z803 Family history of malignant neoplasm of breast: Secondary | ICD-10-CM | POA: Diagnosis not present

## 2021-04-06 DIAGNOSIS — Z86010 Personal history of colonic polyps: Secondary | ICD-10-CM | POA: Insufficient documentation

## 2021-04-06 DIAGNOSIS — Z08 Encounter for follow-up examination after completed treatment for malignant neoplasm: Secondary | ICD-10-CM

## 2021-04-06 DIAGNOSIS — Z85819 Personal history of malignant neoplasm of unspecified site of lip, oral cavity, and pharynx: Secondary | ICD-10-CM | POA: Diagnosis not present

## 2021-04-06 DIAGNOSIS — Z79899 Other long term (current) drug therapy: Secondary | ICD-10-CM | POA: Diagnosis not present

## 2021-04-06 DIAGNOSIS — M129 Arthropathy, unspecified: Secondary | ICD-10-CM | POA: Diagnosis not present

## 2021-04-06 DIAGNOSIS — Z8589 Personal history of malignant neoplasm of other organs and systems: Secondary | ICD-10-CM | POA: Diagnosis not present

## 2021-04-06 DIAGNOSIS — L989 Disorder of the skin and subcutaneous tissue, unspecified: Secondary | ICD-10-CM

## 2021-04-06 DIAGNOSIS — Z85818 Personal history of malignant neoplasm of other sites of lip, oral cavity, and pharynx: Secondary | ICD-10-CM | POA: Diagnosis present

## 2021-04-06 DIAGNOSIS — I4891 Unspecified atrial fibrillation: Secondary | ICD-10-CM | POA: Insufficient documentation

## 2021-04-06 DIAGNOSIS — K219 Gastro-esophageal reflux disease without esophagitis: Secondary | ICD-10-CM | POA: Insufficient documentation

## 2021-04-06 DIAGNOSIS — Z87891 Personal history of nicotine dependence: Secondary | ICD-10-CM | POA: Insufficient documentation

## 2021-04-06 DIAGNOSIS — E039 Hypothyroidism, unspecified: Secondary | ICD-10-CM | POA: Insufficient documentation

## 2021-04-06 DIAGNOSIS — E119 Type 2 diabetes mellitus without complications: Secondary | ICD-10-CM | POA: Diagnosis not present

## 2021-04-06 LAB — COMPREHENSIVE METABOLIC PANEL
ALT: 20 U/L (ref 0–44)
AST: 17 U/L (ref 15–41)
Albumin: 4.4 g/dL (ref 3.5–5.0)
Alkaline Phosphatase: 49 U/L (ref 38–126)
Anion gap: 12 (ref 5–15)
BUN: 13 mg/dL (ref 8–23)
CO2: 30 mmol/L (ref 22–32)
Calcium: 9.9 mg/dL (ref 8.9–10.3)
Chloride: 97 mmol/L — ABNORMAL LOW (ref 98–111)
Creatinine, Ser: 1.12 mg/dL (ref 0.61–1.24)
GFR, Estimated: >60 mL/min (ref >60)
Glucose, Bld: 181 mg/dL — ABNORMAL HIGH (ref 70–99)
Potassium: 4.7 mmol/L (ref 3.5–5.1)
Sodium: 139 mmol/L (ref 135–145)
Total Bilirubin: 1 mg/dL (ref 0.3–1.2)
Total Protein: 7.6 g/dL (ref 6.5–8.1)

## 2021-04-06 LAB — CBC WITH DIFFERENTIAL/PLATELET
Abs Immature Granulocytes: 0.06 10*3/uL (ref 0.00–0.07)
Basophils Absolute: 0 10*3/uL (ref 0.0–0.1)
Basophils Relative: 1 %
Eosinophils Absolute: 0.2 10*3/uL (ref 0.0–0.5)
Eosinophils Relative: 3 %
HCT: 45 % (ref 39.0–52.0)
Hemoglobin: 15.1 g/dL (ref 13.0–17.0)
Immature Granulocytes: 1 %
Lymphocytes Relative: 19 %
Lymphs Abs: 1.3 10*3/uL (ref 0.7–4.0)
MCH: 30.2 pg (ref 26.0–34.0)
MCHC: 33.6 g/dL (ref 30.0–36.0)
MCV: 90 fL (ref 80.0–100.0)
Monocytes Absolute: 0.7 10*3/uL (ref 0.1–1.0)
Monocytes Relative: 11 %
Neutro Abs: 4.2 10*3/uL (ref 1.7–7.7)
Neutrophils Relative %: 65 %
Platelets: 199 10*3/uL (ref 150–400)
RBC: 5 MIL/uL (ref 4.22–5.81)
RDW: 13.4 % (ref 11.5–15.5)
WBC: 6.5 10*3/uL (ref 4.0–10.5)
nRBC: 0 % (ref 0.0–0.2)

## 2021-04-06 LAB — TSH: TSH: 1.251 u[IU]/mL (ref 0.350–4.500)

## 2021-04-11 NOTE — Progress Notes (Signed)
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 Multiple right-sided lymph nodes at level IIA and 2B between 1.5-1.8 cm in size which were suspicious in appearance.also noted to have started asymmetric soft tissue fullness in the oropharynx or on the right lateral wall in the right palatine tonsil region measuring up to 2.7 cm.  PET scans at the end of treatment showed no residual or recurrent disease and he has remained in remission since then  Interval history-patient is concerned about a lesion in the right side of his posterior neck which has not healed for the last 1 year.  It has not grown in size significantly either.  Has baseline dry mouth since the completion of radiation treatment.  Denies any new complaints at this time  ECOG PS- 1 Pain scale- 0   Review of systems- Review of Systems  Constitutional: Negative for chills, fever, malaise/fatigue and weight loss.  HENT: Negative for congestion, ear discharge and nosebleeds.        Dry mouth  Eyes: Negative for blurred vision.  Respiratory: Negative for cough, hemoptysis, sputum  production, shortness of breath and wheezing.   Cardiovascular: Negative for chest pain, palpitations, orthopnea and claudication.  Gastrointestinal: Negative for abdominal pain, blood in stool, constipation, diarrhea, heartburn, melena, nausea and vomiting.  Genitourinary: Negative for dysuria, flank pain, frequency, hematuria and urgency.  Musculoskeletal: Negative for back pain, joint pain and myalgias.  Skin: Negative for rash.  Neurological: Negative for dizziness, tingling, focal weakness, seizures, weakness and headaches.  Endo/Heme/Allergies: Does not bruise/bleed easily.  Psychiatric/Behavioral: Negative for depression and suicidal ideas. The patient does not have insomnia.       Allergies  Allergen Reactions  . Biaxin [Clarithromycin] Other (See Comments)    Severe hiccups  . Ceftin [Cefuroxime Axetil] Other (See Comments)    Severe hiccups  . Penicillins Hives    Has patient had a PCN reaction causing immediate rash, facial/tongue/throat swelling, SOB or lightheadedness with hypotension: No Has patient had a PCN reaction causing severe rash involving mucus membranes or skin necrosis: unknown Has patient had a PCN reaction that required hospitalization: No Has patient had a PCN reaction occurring within the last 10 years: No If all of the above answers are "NO", then may proceed with Cephalosporin use.      Past Medical History:  Diagnosis Date  . Anxiety   . Arthritis   . Cancer ([place]) 08/21/2017   hpv  . Chronic pain 1996   due to work injury  . Chronic prescription opiate use   . Colon polyp    adenomatous polyp ascending colon  . Cough    THAT WONT GO AWAY-PT TO SEE DR Doy Hutching ON 06-10-17 @ 4 PM  . CTS (carpal tunnel syndrome)  bilateral  . Depression   . Diabetes mellitus without complication ([place])   . Dysrhythmia    AFIB X 1  . GERD (gastroesophageal reflux disease)   . Herniated lumbar intervertebral disc   . Hypertension   . Hypothyroidism   .  Long-term current use of testosterone cypionate   . Neck pain   . Neuromuscular disorder ([place])   . Presence of intrathecal pump    Dr. Mechele Dawley in [place], [place]  . Vertigo    Avg: 2x/month     Past Surgical History:  Procedure Laterality Date  . CARPAL TUNNEL RELEASE Left 09/25/2020   Procedure: CARPAL TUNNEL RELEASE;  Surgeon: Dereck Leep, MD;  Location: [facility];  Service: Orthopedics;  Laterality: Left;  . CARPAL TUNNEL RELEASE Right 11/27/2020   Procedure: CARPAL TUNNEL RELEASE;  Surgeon: Dereck Leep, MD;  Location: [facility];  Service: Orthopedics;  Laterality: Right;  . CATARACT EXTRACTION W/PHACO Right 05/30/2020   Procedure: CATARACT EXTRACTION PHACO AND INTRAOCULAR LENS PLACEMENT (IOC) RIGHT DIABETIC 2.83 00:35.8;  Surgeon: Birder Robson, MD;  Location: [place];  Service: Ophthalmology;  Laterality: Right;  Diabetic - insulin and oral meds  . COLON SURGERY    . COLONOSCOPY  2013   [place]  . COLONOSCOPY WITH PROPOFOL N/A 04/22/2017   Procedure: COLONOSCOPY WITH PROPOFOL;  Surgeon: Lollie Sails, MD;  Location: [facility] ENDOSCOPY;  Service: Endoscopy;  Laterality: N/A;  . COLONOSCOPY WITH PROPOFOL N/A 01/12/2021   Procedure: COLONOSCOPY WITH PROPOFOL;  Surgeon: Bland Bellow, MD;  Location: [facility] ENDOSCOPY;  Service: General;  Laterality: N/A;  . HERNIA REPAIR    . IMAGE GUIDED SINUS SURGERY    . implanted morphine pump  2016  . IR IMAGING GUIDED PORT INSERTION  09/26/2017  . IR REMOVAL TUN ACCESS W/ PORT W/O FL MOD SED  02/11/2018  . LAPAROSCOPIC CHOLECYSTECTOMY  2013  . PARTIAL COLECTOMY Right 06/17/2017   Procedure: PARTIAL COLECTOMY;  Surgeon: Christene Lye, MD;  Location: [facility];  Service: General;  Laterality: Right;  . UMBILICAL HERNIA REPAIR  2013  . UMBILICAL HERNIA REPAIR  06/17/2017   Procedure: HERNIA REPAIR UMBILICAL ADULT;  Surgeon: Christene Lye, MD;  Location: [facility];  Service: General;;  .  VASECTOMY      Social History   Socioeconomic History  . Marital status: Married    Spouse name: Not on file  . Number of children: Not on file  . Years of education: Not on file  . Highest education level: Not on file  Occupational History  . Not on file  Tobacco Use  . Smoking status: Former Smoker    Years: 20.00    Types: Cigarettes    Quit date: 12/03/1999    Years since quitting: 21.3  . Smokeless tobacco: Never Used  Vaping Use  . Vaping Use: Never used  Substance and Sexual Activity  . Alcohol use: No  . Drug use: No  . Sexual activity: Yes  Other Topics Concern  . Not on file  Social History Narrative  . Not on file   Social Determinants of Health   Financial Resource Strain: Not on file  Food Insecurity: Not on file  Transportation Needs: Not on file  Physical Activity: Not on file  Stress: Not on file  Social Connections: Not on file  Intimate Partner Violence: Not on file    Family History  Problem Relation Age of Onset  . Breast cancer Cousin 84  .  Cancer - Colon Neg Hx   . Prostate cancer Neg Hx      Current Outpatient Medications:  .  apixaban (ELIQUIS) 5 MG TABS tablet, Take 5 mg by mouth 2 (two) times daily. , Disp: , Rfl:  .  cetirizine (ZYRTEC) 10 MG tablet, Take 10 mg by mouth at bedtime., Disp: , Rfl:  .  Cholecalciferol (VITAMIN D3) 5000 units TABS, Take 5,000 Units by mouth daily. , Disp: , Rfl:  .  citalopram (CELEXA) 40 MG tablet, Take 40 mg by mouth every morning. , Disp: , Rfl:  .  Continuous Blood Gluc Sensor (FREESTYLE LIBRE 14 DAY SENSOR) MISC, USE 1 KIT UTD, Disp: , Rfl: 11 .  diltiazem (CARDIZEM CD) 120 MG 24 hr capsule, Take 120 mg by mouth in the morning and at bedtime. , Disp: , Rfl:  .  fluticasone (FLONASE) 50 MCG/ACT nasal spray, Place 2 sprays into both nostrils daily as needed for allergies., Disp: , Rfl:  .  HYDROcodone-acetaminophen (NORCO) 7.5-325 MG tablet, Take 1 tablet by mouth every 6 (six) hours as needed for  moderate pain. , Disp: , Rfl:  .  Insulin Glargine (BASAGLAR KWIKPEN) 100 UNIT/ML SOPN, Inject 60 Units into the skin at bedtime., Disp: , Rfl:  .  Insulin Pen Needle 32G X 4 MM MISC, USE EVERY DAY AS NEEDED, Disp: , Rfl:  .  lisinopril (ZESTRIL) 10 MG tablet, Take 10 mg by mouth daily., Disp: , Rfl:  .  metFORMIN (GLUCOPHAGE) 500 MG tablet, Take 500 mg by mouth 2 (two) times daily with a meal., Disp: , Rfl:  .  MORPHINE SULFATE IJ, Inject as directed. Morphine 4mg /ml epidural pump. 1.4897 mg/day, Disp: , Rfl:  .  Multiple Vitamins-Minerals (PRESERVISION AREDS 2 PO), Take 1 capsule by mouth in the morning and at bedtime. , Disp: , Rfl:  .  oxymetazoline (AFRIN) 0.05 % nasal spray, Place 1 spray into both nostrils 2 (two) times daily as needed for congestion., Disp: , Rfl:  .  pregabalin (LYRICA) 150 MG capsule, Take 150 mg by mouth 2 (two) times daily., Disp: , Rfl:  .  rosuvastatin (CRESTOR) 10 MG tablet, Take 10 mg by mouth daily. , Disp: , Rfl: 3 .  Semaglutide, 1 MG/DOSE, (OZEMPIC, 1 MG/DOSE,) 4 MG/3ML SOPN, Inject into the skin., Disp: , Rfl:  .  testosterone cypionate (DEPOTESTOSTERONE CYPIONATE) 200 MG/ML injection, Inject 1 mL (200 mg total) into the muscle every 14 (fourteen) days., Disp: 3 mL, Rfl: 0 .  tiZANidine (ZANAFLEX) 4 MG tablet, Take 4 mg by mouth every 8 (eight) hours as needed for muscle spasms., Disp: , Rfl:  .  traMADol (ULTRAM-ER) 300 MG 24 hr tablet, Take 300 mg by mouth at bedtime. , Disp: , Rfl:  .  traZODone (DESYREL) 50 MG tablet, Take 50 mg by mouth at bedtime., Disp: , Rfl:  .  vitamin E 180 MG (400 UNITS) capsule, Take 400 Units by mouth in the morning and at bedtime., Disp: , Rfl:  .  zinc gluconate 50 MG tablet, Take 50 mg by mouth daily., Disp: , Rfl:  No current facility-administered medications for this visit.  Facility-Administered Medications Ordered in Other Visits:  .  0.9 %  sodium chloride infusion, , Intravenous, Continuous, Burns, Jennifer E, NP, Last  Rate: 999 mL/hr at 01/12/21 0805, Continued from Pre-op at 01/12/21 0805 .  heparin lock flush 100 unit/mL, 500 Units, Intravenous, Once, Sindy Guadeloupe, MD  Physical exam:  Vitals:   04/06/21 1426  BP: 140/78  Pulse: 80  SpO2: 96%   Physical Exam HENT:     Head: Normocephalic and atraumatic.     Mouth/Throat:     Mouth: Mucous membranes are moist.     Pharynx: Oropharynx is clear.  Cardiovascular:     Rate and Rhythm: Normal rate and regular rhythm.     Heart sounds: Normal heart sounds.  Pulmonary:     Effort: Pulmonary effort is normal.     Breath sounds: Normal breath sounds.  Abdominal:     General: Bowel sounds are normal.     Palpations: Abdomen is soft.  Lymphadenopathy:     Comments: No palpable cervical adenopathy.  Residual scarring and fibrosis noted in the right side of the neck from prior radiation  Skin:    General: Skin is warm and dry.     Comments: Superficial scab-like area about 0.2 to 2.3 cm in size noted over the upper back at the posterior aspect of the right base of neck  Neurological:     Mental Status: He is alert and oriented to person, place, and time.      CMP Latest Ref Rng & Units 04/06/2021  Glucose 70 - 99 mg/dL 181(H)  BUN 8 - 23 mg/dL 13  Creatinine 0.61 - 1.24 mg/dL 1.12  Sodium 135 - 145 mmol/L 139  Potassium 3.5 - 5.1 mmol/L 4.7  Chloride 98 - 111 mmol/L 97(L)  CO2 22 - 32 mmol/L 30  Calcium 8.9 - 10.3 mg/dL 9.9  Total Protein 6.5 - 8.1 g/dL 7.6  Total Bilirubin 0.3 - 1.2 mg/dL 1.0  Alkaline Phos 38 - 126 U/L 49  AST 15 - 41 U/L 17  ALT 0 - 44 U/L 20   CBC Latest Ref Rng & Units 04/06/2021  WBC 4.0 - 10.5 K/uL 6.5  Hemoglobin 13.0 - 17.0 g/dL 15.1  Hematocrit 39.0 - 52.0 % 45.0  Platelets 150 - 400 K/uL 199      US Venous Img Lower Unilateral Left (DVT)  Result Date: 03/30/2021 CLINICAL DATA:  Lower extremity edema for 1 day EXAM: LEFT LOWER EXTREMITY VENOUS DOPPLER ULTRASOUND TECHNIQUE: Gray-scale sonography with  compression, as well as color and duplex ultrasound, were performed to evaluate the deep venous system(s) from the level of the common femoral vein through the popliteal and proximal calf veins. COMPARISON:  None. FINDINGS: VENOUS Normal compressibility of the common femoral, superficial femoral, and popliteal veins, as well as the visualized calf veins. Visualized portions of profunda femoral vein and great saphenous vein unremarkable. No filling defects to suggest DVT on grayscale or color Doppler imaging. Doppler waveforms show normal direction of venous flow, normal respiratory plasticity and response to augmentation. Limited views of the contralateral common femoral vein are unremarkable. OTHER None. Limitations: none IMPRESSION: No evidence of deep venous thrombosis in the left lower extremity. Electronically Signed   By: Ilona Sorrel M.D.   On: 03/30/2021 13:48     Assessment and plan- Patient is a 65 y.o. 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  He continues to follow-up with radiation oncology and ENT as well.  I will see him back in 1 year with CBC with differential CMP and TSH  With regards to his skin lesion I will refer him to dermatology  since he is a lot that the lesion has remained persistent and not healed in the last 1 year   Visit Diagnosis 1. Encounter for follow-up surveillance of head and neck cancer   2. Skin lesion of back      Dr. Randa Evens, MD, MPH [facility] [phone] 04/11/2021 11:35 AM

## 2021-06-08 NOTE — Progress Notes (Signed)
[facility] [place],  [address]  Pulmonary Sleep Medicine   Office Visit Note  Patient Name: Stephen Yu DOB: Oct 16, 1956 MRN [id number]    Chief Complaint: Obstructive Sleep Apnea visit  Brief History:  Stephen Yu is seen today for follow up consult 6 weeks after getting BiPAP S/T device. The patient has a 4 month history of sleep apnea. Patient is not using PAP nightly.  The patient feels no better after sleeping with PAP.  The patient reports ear aches from PAP use. Reported sleepiness is  no improvement and the Epworth Sleepiness Score is 1 out of 24. The patient not take naps. The patient complains of the following: ear aches & difficulty breathing with the machine. and ongoing problems.   The compliance download shows  compliance with an average use time of 1:09 hours @ 7%. The AHI is 11.7 and leak of 20.7 lpm.    The patient does not complain of limb movements disrupting sleep.  ROS  General: (-) fever, (-) chills, (-) night sweat Nose and Sinuses: (-) nasal stuffiness or itchiness, (-) postnasal drip, (-) nosebleeds, (-) sinus trouble. Mouth and Throat: (-) sore throat, (-) hoarseness. Neck: (-) swollen glands, (-) enlarged thyroid, (-) neck pain. Respiratory: - cough, - shortness of breath, - wheezing. Neurologic: + numbness, + tingling related to spine issues Psychiatric: - anxiety, - depression   Current Medication: Outpatient Encounter Medications as of 06/11/2021  Medication Sig Note   diltiazem (TIAZAC) 120 MG 24 hr capsule Take 1 capsule by mouth 2 (two) times daily.    furosemide (LASIX) 20 MG tablet Take by mouth.    insulin glargine (LANTUS) 100 UNIT/ML Solostar Pen Inject into the skin.    pregabalin (LYRICA) 150 MG capsule Take by mouth.    TraMADol HCl 300 MG CP24 Take 1 capsule by mouth at bedtime as needed.    apixaban (ELIQUIS) 5 MG TABS tablet Take 5 mg by mouth 2 (two) times daily.     cetirizine (ZYRTEC) 10 MG tablet  Take 10 mg by mouth at bedtime.    Cholecalciferol (VITAMIN D3) 5000 units TABS Take 5,000 Units by mouth daily.     citalopram (CELEXA) 40 MG tablet Take 40 mg by mouth every morning.     Continuous Blood Gluc Sensor (FREESTYLE LIBRE 14 DAY SENSOR) MISC USE 1 KIT UTD    cyclobenzaprine (FLEXERIL) 10 MG tablet Take 10 mg by mouth at bedtime.    diltiazem (CARDIZEM CD) 120 MG 24 hr capsule Take 120 mg by mouth in the morning and at bedtime.     fluticasone (FLONASE) 50 MCG/ACT nasal spray Place 2 sprays into both nostrils daily as needed for allergies.    HYDROcodone-acetaminophen (NORCO) 7.5-325 MG tablet Take 1 tablet by mouth every 6 (six) hours as needed for moderate pain.     [START ON 06/23/2021] HYDROcodone-acetaminophen (NORCO) 7.5-325 MG tablet Take by mouth.    Insulin Glargine (BASAGLAR KWIKPEN) 100 UNIT/ML SOPN Inject 60 Units into the skin at bedtime.    Insulin Pen Needle 32G X 4 MM MISC USE EVERY DAY AS NEEDED    lisinopril (ZESTRIL) 10 MG tablet Take 10 mg by mouth daily.    metFORMIN (GLUCOPHAGE) 500 MG tablet Take 500 mg by mouth 2 (two) times daily with a meal.    MORPHINE SULFATE IJ Inject as directed. Morphine 33m/ml epidural pump. 1.4897 mg/day 09/25/2020: Continuos infusion.   Multiple Vitamins-Minerals (PRESERVISION AREDS 2 PO) Take 1 capsule by  mouth in the morning and at bedtime.     oxymetazoline (AFRIN) 0.05 % nasal spray Place 1 spray into both nostrils 2 (two) times daily as needed for congestion.    pregabalin (LYRICA) 150 MG capsule Take 150 mg by mouth 2 (two) times daily.    rosuvastatin (CRESTOR) 10 MG tablet Take 10 mg by mouth daily.     Semaglutide, 1 MG/DOSE, (OZEMPIC, 1 MG/DOSE,) 4 MG/3ML SOPN Inject into the skin.    testosterone cypionate (DEPOTESTOSTERONE CYPIONATE) 200 MG/ML injection Inject 1 mL (200 mg total) into the muscle every 14 (fourteen) days.    tiZANidine (ZANAFLEX) 4 MG tablet Take 4 mg by mouth every 8 (eight) hours as needed for muscle  spasms.    traMADol (ULTRAM-ER) 300 MG 24 hr tablet Take 300 mg by mouth at bedtime.     traZODone (DESYREL) 50 MG tablet Take 50 mg by mouth at bedtime.    vitamin E 180 MG (400 UNITS) capsule Take 400 Units by mouth in the morning and at bedtime.    zinc gluconate 50 MG tablet Take 50 mg by mouth daily.    Facility-Administered Encounter Medications as of 06/11/2021  Medication   0.9 %  sodium chloride infusion   heparin lock flush 100 unit/mL    Surgical History: Past Surgical History:  Procedure Laterality Date   CARPAL TUNNEL RELEASE Left 09/25/2020   Procedure: CARPAL TUNNEL RELEASE;  Surgeon: Dereck Leep, MD;  Location: [facility];  Service: Orthopedics;  Laterality: Left;   CARPAL TUNNEL RELEASE Right 11/27/2020   Procedure: CARPAL TUNNEL RELEASE;  Surgeon: Dereck Leep, MD;  Location: [facility];  Service: Orthopedics;  Laterality: Right;   CATARACT EXTRACTION W/PHACO Right 05/30/2020   Procedure: CATARACT EXTRACTION PHACO AND INTRAOCULAR LENS PLACEMENT (IOC) RIGHT DIABETIC 2.83 00:35.8;  Surgeon: Birder Robson, MD;  Location: [place];  Service: Ophthalmology;  Laterality: Right;  Diabetic - insulin and oral meds   COLON SURGERY     COLONOSCOPY  2013   [place]   COLONOSCOPY WITH PROPOFOL N/A 04/22/2017   Procedure: COLONOSCOPY WITH PROPOFOL;  Surgeon: Lollie Sails, MD;  Location: [facility] END[facility]COPY;  Service: Endoscopy;  Laterality: N/A;   COLONOSCOPY WITH PROPOFOL N/A 01/12/2021   Procedure: COLONOSCOPY WITH PROPOFOL;  Surgeon: Lysle Bellow, MD;  Location: [facility] ENDOSCOPY;  Service: General;  Laterality: N/A;   HERNIA REPAIR     IMAGE GUIDED SINUS SURGERY     implanted morphine pump  2016   IR IMAGING GUIDED PORT INSERTION  09/26/2017   IR REMOVAL TUN ACCESS W/ PORT W/O FL MOD SED  02/11/2018   LAPAROSCOPIC CHOLECYSTECTOMY  2013   PARTIAL COLECTOMY Right 06/17/2017   Procedure: PARTIAL COLECTOMY;  Surgeon: Christene Lye, MD;  Location: [facility];   Service: General;  Laterality: Right;   UMBILICAL HERNIA REPAIR  6440   UMBILICAL HERNIA REPAIR  06/17/2017   Procedure: HERNIA REPAIR UMBILICAL ADULT;  Surgeon: Christene Lye, MD;  Location: [facility];  Service: General;;   VASECTOMY      Medical History: Past Medical History:  Diagnosis Date   Anxiety    Arthritis    Cancer ([place]) 08/21/2017   hpv   Chronic pain 1996   due to work injury   Chronic prescription opiate use    Colon polyp    adenomatous polyp ascending colon   Cough    THAT WONT GO AWAY-PT TO SEE DR Doy Hutching ON 06-10-17 @ 4 PM  CTS (carpal tunnel syndrome)    bilateral   Depression    Diabetes mellitus without complication (HCC)    Dysrhythmia    AFIB X 1   GERD (gastroesophageal reflux disease)    Herniated lumbar intervertebral disc    Hypertension    Hypothyroidism    Long-term current use of testosterone cypionate    Neck pain    Neuromuscular disorder (HCC)    Presence of intrathecal pump    Dr. Mechele Dawley in [place], [place]   Vertigo    Avg: 2x/month    Family History: Non contributory to the present illness  Social History: Social History   Socioeconomic History   Marital status: Married    Spouse name: Not on file   Number of children: Not on file   Years of education: Not on file   Highest education level: Not on file  Occupational History   Not on file  Tobacco Use   Smoking status: Former    Years: 20.00    Pack years: 0.00    Types: Cigarettes    Quit date: 12/03/1999    Years since quitting: 21.5   Smokeless tobacco: Never  Vaping Use   Vaping Use: Never used  Substance and Sexual Activity   Alcohol use: No   Drug use: No   Sexual activity: Yes  Other Topics Concern   Not on file  Social History Narrative   Not on file   Social Determinants of Health   Financial Resource Strain: Not on file  Food Insecurity: Not on file  Transportation Needs: Not on file  Physical Activity: Not on file   Stress: Not on file  Social Connections: Not on file  Intimate Partner Violence: Not on file    Vital Signs: Blood pressure 132/69, pulse 68, resp. rate 18, height $RemoveBe'5\' 10"'jFsPUVFTN$  (1.778 m), weight 220 lb (99.8 kg), SpO2 96 %.  Examination: General Appearance: The patient is well-developed, well-nourished, and in no distress. Neck Circumference: 39 Skin: Gross inspection of skin unremarkable. Head: normocephalic, no gross deformities. Eyes: no gross deformities noted. ENT: ears appear grossly normal Neurologic: Alert and oriented. No involuntary movements.    EPWORTH SLEEPINESS SCALE:  Scale:  (0)= no chance of dozing; (1)= slight chance of dozing; (2)= moderate chance of dozing; (3)= high chance of dozing  Chance  Situtation    Sitting and reading: 0     Watching TV: 0    Sitting Inactive in public: 0    As a passenger in car: 0      Lying down to rest: 1    Sitting and talking: 0    Sitting quielty after lunch: 0    In a car, stopped in traffic: 0   TOTAL SCORE:   1 out of 24    SLEEP STUDIES:  PSG 02/08/21 AHI 63,  low SpO2 71%   CPAP COMPLIANCE DATA:  Date Range: 05/09/21 - 06/07/21  Average Daily Use: 1:09 hours  Median Use: 3:12 hours  Compliance for > 4 Hours: 7% days  AHI: 11.7 respiratory events per hour  Days Used: 11/30 days  Mask Leak: 20.7lpm  95th Percentile Pressure: 17/12 x 10 bpm         LABS: Recent Results (from the past 2160 hour(s))  TSH     Status: None   Collection Time: 04/06/21  2:04 PM  Result Value Ref Range   TSH 1.251 0.350 - 4.500 uIU/mL    Comment: Performed by a 3rd  Generation assay with a functional sensitivity of <=0.01 uIU/mL. Performed at [facility], [address] [place], [place] [address]   Comprehensive metabolic panel     Status: Abnormal   Collection Time: 04/06/21  2:04 PM  Result Value Ref Range   Sodium 139 135 - 145 mmol/L   Potassium 4.7 3.5 - 5.1 mmol/L   Chloride 97 (L) 98 -  111 mmol/L   CO2 30 22 - 32 mmol/L   Glucose, Bld 181 (H) 70 - 99 mg/dL    Comment: Glucose reference range applies only to samples taken after fasting for at least 8 hours.   BUN 13 8 - 23 mg/dL   Creatinine, Ser 1.12 0.61 - 1.24 mg/dL   Calcium 9.9 8.9 - 10.3 mg/dL   Total Protein 7.6 6.5 - 8.1 g/dL   Albumin 4.4 3.5 - 5.0 g/dL   AST 17 15 - 41 U/L   ALT 20 0 - 44 U/L   Alkaline Phosphatase 49 38 - 126 U/L   Total Bilirubin 1.0 0.3 - 1.2 mg/dL   GFR, Estimated >60 >60 mL/min    Comment: (NOTE) Calculated using the CKD-EPI Creatinine Equation (2021)    Anion gap 12 5 - 15    Comment: Performed at [facility], [address] [place], [place] [address]  CBC with Differential/Platelet     Status: None   Collection Time: 04/06/21  2:04 PM  Result Value Ref Range   WBC 6.5 4.0 - 10.5 K/uL   RBC 5.00 4.22 - 5.81 MIL/uL   Hemoglobin 15.1 13.0 - 17.0 g/dL   HCT 45.0 39.0 - 52.0 %   MCV 90.0 80.0 - 100.0 fL   MCH 30.2 26.0 - 34.0 pg   MCHC 33.6 30.0 - 36.0 g/dL   RDW 13.4 11.5 - 15.5 %   Platelets 199 150 - 400 K/uL   nRBC 0.0 0.0 - 0.2 %   Neutrophils Relative % 65 %   Neutro Abs 4.2 1.7 - 7.7 K/uL   Lymphocytes Relative 19 %   Lymphs Abs 1.3 0.7 - 4.0 K/uL   Monocytes Relative 11 %   Monocytes Absolute 0.7 0.1 - 1.0 K/uL   Eosinophils Relative 3 %   Eosinophils Absolute 0.2 0.0 - 0.5 K/uL   Basophils Relative 1 %   Basophils Absolute 0.0 0.0 - 0.1 K/uL   Immature Granulocytes 1 %   Abs Immature Granulocytes 0.06 0.00 - 0.07 K/uL    Comment: Performed at [facility], [address], [place], [place] [address]    Radiology: US Venous Img Lower Unilateral Left (DVT)  Result Date: 03/30/2021 CLINICAL DATA:  Lower extremity edema for 1 day EXAM: LEFT LOWER EXTREMITY VENOUS DOPPLER ULTRASOUND TECHNIQUE: Gray-scale sonography with compression, as well as color and duplex ultrasound, were performed to evaluate the deep venous system(s) from the level of the common  femoral vein through the popliteal and proximal calf veins. COMPARISON:  None. FINDINGS: VENOUS Normal compressibility of the common femoral, superficial femoral, and popliteal veins, as well as the visualized calf veins. Visualized portions of profunda femoral vein and great saphenous vein unremarkable. No filling defects to suggest DVT on grayscale or color Doppler imaging. Doppler waveforms show normal direction of venous flow, normal respiratory plasticity and response to augmentation. Limited views of the contralateral common femoral vein are unremarkable. OTHER None. Limitations: none IMPRESSION: No evidence of deep venous thrombosis in the left lower extremity. Electronically Signed   By: Rinaldo Ratel  Poff M.D.   On: 03/30/2021 13:48    No results found.  No results found.    Assessment and Plan: Patient Active Problem List   Diagnosis Date Noted   Central sleep apnea 06/11/2021   Sleep apnea treated with nocturnal BiPAP 06/11/2021   Paroxysmal atrial fibrillation ([place]) 06/11/2021   OSA (obstructive sleep apnea) 03/26/2021   Hypertension    GERD (gastroesophageal reflux disease)    Dysrhythmia    Hypogonadism male 11/01/2019   DM type 2 with diabetic peripheral neuropathy ([place]) 06/11/2019   Uncontrolled type 2 diabetes mellitus with hyperglycemia ([place]) 06/11/2019   Hyperlipidemia, mixed 04/22/2018   Dehydration 12/01/2017   Atrial flutter, paroxysmal ([place]) 10/14/2017   Paroxysmal A-fib ([place]) 10/13/2017   SOBOE (shortness of breath on exertion) 10/13/2017   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 Central sleep apnea The patient does not  tolerate PAP as of yet and  reports no or little  benefit from PAP use. The patient was reminded how to clean equipment and advised to replace supplies routinely. He is fighting the machine and we will adjust his comfort settings in an upcoming tech visit. The patient was also counselled on weight loss. The compliance is poor. The AHI is 11.7.   2. Sleep apnea treated with nocturnal BiPAP Adjust comfort settings, f/u 6-8 w  3. Gastroesophageal reflux disease without esophagitis Stable, controlled, avoid lifestyle triggers  4. Paroxysmal atrial fibrillation (HCC) Stable.      General Counseling: I have discussed the findings of the evaluation and examination with Herbie [place].  I have also discussed any further diagnostic evaluation thatmay be needed or ordered today. Korver verbalizes understanding of the findings of todays visit. We also reviewed his medications today and discussed drug interactions and side effects including but not limited excessive drowsiness and altered mental states. We also discussed that there is always a risk not just to him but also people around him. he has been encouraged to call the office with any questions or concerns that should arise related to todays visit.  No orders of the defined types were placed in this encounter.       I have personally obtained a history, examined the patient, evaluated laboratory and imaging results, formulated the assessment and plan and placed orders.   This patient was seen today by Tressie Ellis, PA-C in collaboration with Dr. Devona Konig.      Allyne Gee, MD [facility] Diplomate ABMS Pulmonary and Critical Care Medicine Sleep medicine

## 2021-06-11 ENCOUNTER — Ambulatory Visit (INDEPENDENT_AMBULATORY_CARE_PROVIDER_SITE_OTHER): Payer: Federal, State, Local not specified - PPO | Admitting: Internal Medicine

## 2021-06-11 VITALS — BP 132/69 | HR 68 | Resp 18 | Ht 70.0 in | Wt 220.0 lb

## 2021-06-11 DIAGNOSIS — K219 Gastro-esophageal reflux disease without esophagitis: Secondary | ICD-10-CM | POA: Diagnosis not present

## 2021-06-11 DIAGNOSIS — G4731 Primary central sleep apnea: Secondary | ICD-10-CM

## 2021-06-11 DIAGNOSIS — I480 Paroxysmal atrial fibrillation: Secondary | ICD-10-CM

## 2021-06-11 DIAGNOSIS — G4730 Sleep apnea, unspecified: Secondary | ICD-10-CM | POA: Diagnosis not present

## 2021-06-11 NOTE — Patient Instructions (Signed)

## 2022-04-05 ENCOUNTER — Inpatient Hospital Stay: Payer: Federal, State, Local not specified - PPO | Attending: Oncology

## 2022-04-05 ENCOUNTER — Other Ambulatory Visit: Payer: Federal, State, Local not specified - PPO

## 2022-04-05 ENCOUNTER — Inpatient Hospital Stay: Payer: Federal, State, Local not specified - PPO | Admitting: Nurse Practitioner

## 2022-04-05 ENCOUNTER — Ambulatory Visit: Payer: Federal, State, Local not specified - PPO | Admitting: Oncology

## 2022-04-05 VITALS — HR 71 | Temp 97.7°F | Resp 18 | Wt 215.0 lb

## 2022-04-05 DIAGNOSIS — Z86010 Personal history of colonic polyps: Secondary | ICD-10-CM | POA: Insufficient documentation

## 2022-04-05 DIAGNOSIS — I4891 Unspecified atrial fibrillation: Secondary | ICD-10-CM | POA: Insufficient documentation

## 2022-04-05 DIAGNOSIS — Z08 Encounter for follow-up examination after completed treatment for malignant neoplasm: Secondary | ICD-10-CM

## 2022-04-05 DIAGNOSIS — G8929 Other chronic pain: Secondary | ICD-10-CM | POA: Insufficient documentation

## 2022-04-05 DIAGNOSIS — E119 Type 2 diabetes mellitus without complications: Secondary | ICD-10-CM | POA: Insufficient documentation

## 2022-04-05 DIAGNOSIS — Z8589 Personal history of malignant neoplasm of other organs and systems: Secondary | ICD-10-CM

## 2022-04-05 DIAGNOSIS — I10 Essential (primary) hypertension: Secondary | ICD-10-CM | POA: Insufficient documentation

## 2022-04-05 DIAGNOSIS — E039 Hypothyroidism, unspecified: Secondary | ICD-10-CM | POA: Insufficient documentation

## 2022-04-05 DIAGNOSIS — C109 Malignant neoplasm of oropharynx, unspecified: Secondary | ICD-10-CM | POA: Insufficient documentation

## 2022-04-05 DIAGNOSIS — Z7901 Long term (current) use of anticoagulants: Secondary | ICD-10-CM | POA: Insufficient documentation

## 2022-04-05 DIAGNOSIS — Z87891 Personal history of nicotine dependence: Secondary | ICD-10-CM | POA: Diagnosis not present

## 2022-04-05 DIAGNOSIS — K219 Gastro-esophageal reflux disease without esophagitis: Secondary | ICD-10-CM | POA: Diagnosis not present

## 2022-04-05 DIAGNOSIS — Z79899 Other long term (current) drug therapy: Secondary | ICD-10-CM | POA: Diagnosis not present

## 2022-04-05 DIAGNOSIS — L989 Disorder of the skin and subcutaneous tissue, unspecified: Secondary | ICD-10-CM

## 2022-04-05 LAB — CBC WITH DIFFERENTIAL/PLATELET
Abs Immature Granulocytes: 0.03 10*3/uL (ref 0.00–0.07)
Basophils Absolute: 0 10*3/uL (ref 0.0–0.1)
Basophils Relative: 1 %
Eosinophils Absolute: 0.2 10*3/uL (ref 0.0–0.5)
Eosinophils Relative: 3 %
HCT: 46.8 % (ref 39.0–52.0)
Hemoglobin: 15.7 g/dL (ref 13.0–17.0)
Immature Granulocytes: 1 %
Lymphocytes Relative: 22 %
Lymphs Abs: 1.3 10*3/uL (ref 0.7–4.0)
MCH: 30.5 pg (ref 26.0–34.0)
MCHC: 33.5 g/dL (ref 30.0–36.0)
MCV: 90.9 fL (ref 80.0–100.0)
Monocytes Absolute: 0.6 10*3/uL (ref 0.1–1.0)
Monocytes Relative: 9 %
Neutro Abs: 3.8 10*3/uL (ref 1.7–7.7)
Neutrophils Relative %: 64 %
Platelets: 161 10*3/uL (ref 150–400)
RBC: 5.15 MIL/uL (ref 4.22–5.81)
RDW: 13.4 % (ref 11.5–15.5)
WBC: 5.9 10*3/uL (ref 4.0–10.5)
nRBC: 0 % (ref 0.0–0.2)

## 2022-04-05 LAB — COMPREHENSIVE METABOLIC PANEL
ALT: 63 U/L — ABNORMAL HIGH (ref 0–44)
AST: 33 U/L (ref 15–41)
Albumin: 4.1 g/dL (ref 3.5–5.0)
Alkaline Phosphatase: 46 U/L (ref 38–126)
Anion gap: 6 (ref 5–15)
BUN: 15 mg/dL (ref 8–23)
CO2: 27 mmol/L (ref 22–32)
Calcium: 8.8 mg/dL — ABNORMAL LOW (ref 8.9–10.3)
Chloride: 101 mmol/L (ref 98–111)
Creatinine, Ser: 1.05 mg/dL (ref 0.61–1.24)
GFR, Estimated: >60 mL/min (ref >60)
Glucose, Bld: 216 mg/dL — ABNORMAL HIGH (ref 70–99)
Potassium: 4.5 mmol/L (ref 3.5–5.1)
Sodium: 134 mmol/L — ABNORMAL LOW (ref 135–145)
Total Bilirubin: 1.3 mg/dL — ABNORMAL HIGH (ref 0.3–1.2)
Total Protein: 6.8 g/dL (ref 6.5–8.1)

## 2022-04-05 LAB — TSH: TSH: 0.791 u[IU]/mL (ref 0.350–4.500)

## 2022-04-05 NOTE — Progress Notes (Signed)
? ?Hematology/Oncology Consult Note ?[place]  ?[phone] [phone] ? ?Patient Care Team: ?Idelle Crouch, MD as PCP - General (Internal Medicine) ?Lollie Sails, MD (Inactive) as Consulting Physician (Gastroenterology) ?Christene Lye, MD (General Surgery) ?Sindy Guadeloupe, MD as Consulting Physician (Oncology) ?Noreene Filbert, MD as Referring Physician (Radiation Oncology)  ? ?Name of the patient: Stephen Yu  ?[id number]  ?[birth year]  ? ?Date of visit: 04/05/22 ? 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 ?Chief complaint/ Reason for visit- surveillance of SCC of oropharynx ? 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 Multiple right-sided lymph nodes at level IIA and 2B between 1.5-1.8 cm in size which were suspicious in appearance.also noted to have started asymmetric soft tissue fullness in the oropharynx or on the right lateral wall in the right palatine tonsil region measuring up to 2.7 cm.  PET scans at the end of treatment showed no residual or recurrent disease and he has remained in remission since then ? 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 Other than chronic pain and chronic dry mouth he feels well. He has had dental caries and is receiving treatment for this.  ? ?ECOG PS- 1 ?Pain scale- 0 ? ?Review of systems- Review of Systems  ?Constitutional:  Negative for chills, fever, malaise/fatigue and weight loss.  ?HENT:  Negative for congestion, ear discharge and nosebleeds.   ?      Dry mouth  ?Eyes:  Negative for blurred vision.  ?Respiratory:  Negative for cough, hemoptysis, sputum production, shortness of breath and wheezing.   ?Cardiovascular:  Negative for chest pain, palpitations, orthopnea and claudication.  ?Gastrointestinal:  Negative for abdominal pain, blood in stool, constipation, diarrhea, heartburn, melena, nausea and vomiting.  ?Genitourinary:  Negative for dysuria, flank pain, frequency, hematuria and urgency.  ?Musculoskeletal:  Negative for back pain, joint pain and myalgias.  ?Skin:  Negative for rash.  ?Neurological:  Negative for dizziness, tingling, focal weakness, seizures, weakness and headaches.  ?Endo/Heme/Allergies:  Does not bruise/bleed easily.  ?Psychiatric/Behavioral:  Negative for depression and suicidal ideas. The patient does not have insomnia.    ? ? ?Allergies  ?Allergen Reactions  ? Biaxin [Clarithromycin] Other (See Comments)  ?  Severe hiccups  ? Ceftin [Cefuroxime Axetil] Other (See Comments)  ?  Severe hiccups  ? Penicillins Hives  ?  Has patient had a PCN reaction causing immediate rash, facial/tongue/throat swelling, SOB or lightheadedness with hypotension: No ?Has patient had a PCN reaction causing severe rash involving mucus membranes or skin necrosis: unknown ?Has patient had a PCN reaction that required hospitalization: No ?Has patient had a PCN reaction occurring within the last 10 years: No ?If all of the above answers are "NO", then may proceed with Cephalosporin use. ?  ? ? ? ?Past Medical History:  ?Diagnosis Date  ? Anxiety   ? Arthritis   ? Cancer ([place]) 08/21/2017  ? hpv  ? Chronic pain 1996  ? due to work injury  ? Chronic prescription opiate use   ? Colon polyp   ? adenomatous polyp ascending colon  ? Cough   ? THAT WONT  GO AWAY-PT TO SEE DR Doy Hutching ON 06-10-17 @ 4 PM  ? CTS (carpal tunnel syndrome)   ? bilateral  ? Depression   ? Diabetes mellitus without complication ([place])   ? Dysrhythmia   ? AFIB X 1  ? GERD (gastroesophageal reflux  disease)   ? Herniated lumbar intervertebral disc   ? Hypertension   ? Hypothyroidism   ? Long-term current use of testosterone cypionate   ? Neck pain   ? Neuromuscular disorder ([place])   ? Presence of intrathecal pump   ? Dr. Mechele Dawley in [place], [place]  ? Vertigo   ? Avg: 2x/month  ? ? ? ?Past Surgical History:  ?Procedure Laterality Date  ? CARPAL TUNNEL RELEASE Left 09/25/2020  ? Procedure: CARPAL TUNNEL RELEASE;  Surgeon: Dereck Leep, MD;  Location: [facility];  Service: Orthopedics;  Laterality: Left;  ? CARPAL TUNNEL RELEASE Right 11/27/2020  ? Procedure: CARPAL TUNNEL RELEASE;  Surgeon: Dereck Leep, MD;  Location: [facility];  Service: Orthopedics;  Laterality: Right;  ? CATARACT EXTRACTION W/PHACO Right 05/30/2020  ? Procedure: CATARACT EXTRACTION PHACO AND INTRAOCULAR LENS PLACEMENT (IOC) RIGHT DIABETIC 2.83 00:35.8;  Surgeon: Birder Robson, MD;  Location: [place];  Service: Ophthalmology;  Laterality: Right;  Diabetic - insulin and oral meds  ? COLON SURGERY    ? COLONOSCOPY  2013  ? [place]  ? COLONOSCOPY WITH PROPOFOL N/A 04/22/2017  ? Procedure: COLONOSCOPY WITH PROPOFOL;  Surgeon: Lollie Sails, MD;  Location: [facility];  Service: Endoscopy;  Laterality: N/A;  ? COLONOSCOPY WITH PROPOFOL N/A 01/12/2021  ? Procedure: COLONOSCOPY WITH PROPOFOL;  Surgeon: Larson Bellow, MD;  Location: [facility];  Service: General;  Laterality: N/A;  ? HERNIA REPAIR    ? IMAGE GUIDED SINUS SURGERY    ? implanted morphine pump  2016  ? IR IMAGING GUIDED PORT INSERTION  09/26/2017  ? IR REMOVAL TUN ACCESS W/ PORT W/O FL MOD SED  02/11/2018  ? LAPAROSCOPIC CHOLECYSTECTOMY  2013  ? PARTIAL COLECTOMY Right 06/17/2017  ? Procedure: PARTIAL COLECTOMY;  Surgeon: Christene Lye, MD;  Location: [facility];  Service: General;  Laterality: Right;  ? UMBILICAL HERNIA REPAIR  2013  ? UMBILICAL HERNIA REPAIR  06/17/2017  ? Procedure: HERNIA REPAIR UMBILICAL ADULT;  Surgeon: Christene Lye, MD;  Location: [facility];  Service: General;;  ? VASECTOMY    ? ? ?Social History  ? ?Socioeconomic History  ? Marital status: Married  ?  Spouse name: Not on file  ? Number of children: Not on file  ? Years of education: Not on file  ? Highest education level: Not on file  ?Occupational History  ? Not on file  ?Tobacco Use  ? Smoking status: Former  ?  Years: 20.00  ?  Types: Cigarettes  ?  Quit date: 12/03/1999  ?  Years since quitting: 22.3  ? Smokeless tobacco: Never  ?Vaping Use  ? Vaping Use: Never used  ?Substance and Sexual Activity  ? Alcohol use: No  ? Drug use: No  ? Sexual activity: Yes  ?Other Topics Concern  ? Not on file  ?Social History Narrative  ? Not on file  ? ?Social Determinants of Health  ? ?Financial Resource Strain: Not on file  ?Food Insecurity: Not on file  ?Transportation Needs: Not on file  ?Physical Activity: Not on file  ?Stress: Not on file  ?Social Connections: Not on file  ?Intimate Partner Violence: Not on file  ? ? ?  Family History  ?Problem Relation Age of Onset  ? Breast cancer Cousin 49  ? Cancer - Colon Neg Hx   ? Prostate cancer Neg Hx   ? ? ? ?Current Outpatient Medications:  ?  apixaban (ELIQUIS) 5 MG TABS tablet, Take 5 mg by mouth 2 (two) times daily. , Disp: , Rfl:  ?  cetirizine (ZYRTEC) 10 MG tablet, Take 10 mg by mouth at bedtime., Disp: , Rfl:  ?  Cholecalciferol (VITAMIN D3) 5000 units TABS, Take 5,000 Units by mouth daily. , Disp: , Rfl:  ?  citalopram (CELEXA) 40 MG tablet, Take 40 mg by mouth every morning. , Disp: , Rfl:  ?  Continuous Blood Gluc Sensor (FREESTYLE LIBRE 14 DAY SENSOR) MISC, USE 1 KIT UTD, Disp: , Rfl: 11 ?  cyclobenzaprine (FLEXERIL) 10 MG tablet, Take 10 mg by mouth at bedtime., Disp: , Rfl:  ?  diltiazem (CARDIZEM CD) 120 MG 24 hr capsule, Take 120 mg by mouth in the morning and at bedtime. , Disp: , Rfl:  ?  diltiazem (TIAZAC) 120 MG 24 hr capsule, Take 1 capsule by mouth 2 (two) times daily., Disp: , Rfl:  ?  fluticasone  (FLONASE) 50 MCG/ACT nasal spray, Place 2 sprays into both nostrils daily as needed for allergies., Disp: , Rfl:  ?  furosemide (LASIX) 20 MG tablet, Take by mouth., Disp: , Rfl:  ?  HYDROcodone-acetaminophen (NORC

## 2022-04-05 NOTE — Progress Notes (Signed)
Patient denies any concerns today.  

## 2022-05-10 ENCOUNTER — Other Ambulatory Visit: Payer: Self-pay | Admitting: Internal Medicine

## 2022-05-10 DIAGNOSIS — C109 Malignant neoplasm of oropharynx, unspecified: Secondary | ICD-10-CM

## 2022-05-16 ENCOUNTER — Ambulatory Visit: Payer: Federal, State, Local not specified - PPO

## 2022-07-11 ENCOUNTER — Ambulatory Visit
Admission: RE | Admit: 2022-07-11 | Discharge: 2022-07-11 | Disposition: A | Discharge status: Home / Self Care | Payer: Federal, State, Local not specified - PPO | Source: Ambulatory Visit | Attending: Internal Medicine | Admitting: Internal Medicine

## 2022-07-11 DIAGNOSIS — C109 Malignant neoplasm of oropharynx, unspecified: Secondary | ICD-10-CM | POA: Insufficient documentation

## 2022-10-29 ENCOUNTER — Telehealth: Payer: Self-pay | Admitting: *Deleted

## 2022-10-29 NOTE — Telephone Encounter (Signed)
I spoke to Dr Janese Banks about this and she feels that pt needs to see ENT. I looked in the computer and he saw Dr. Tami Ribas Before and I called over there and the pt can come tom at 1:30 and the pt. Knows this and he knows the new building- he has been there already

## 2022-11-20 ENCOUNTER — Other Ambulatory Visit: Payer: Self-pay | Admitting: Unknown Physician Specialty

## 2022-11-22 LAB — SURGICAL PATHOLOGY

## 2023-04-07 ENCOUNTER — Other Ambulatory Visit: Payer: Self-pay

## 2023-04-07 DIAGNOSIS — Z8589 Personal history of malignant neoplasm of other organs and systems: Secondary | ICD-10-CM

## 2023-04-08 ENCOUNTER — Encounter: Payer: Self-pay | Admitting: Oncology

## 2023-04-08 ENCOUNTER — Inpatient Hospital Stay (HOSPITAL_BASED_OUTPATIENT_CLINIC_OR_DEPARTMENT_OTHER): Payer: Federal, State, Local not specified - PPO | Admitting: Oncology

## 2023-04-08 ENCOUNTER — Inpatient Hospital Stay: Payer: Federal, State, Local not specified - PPO | Attending: Oncology

## 2023-04-08 VITALS — BP 102/70 | HR 76 | Temp 97.7°F | Resp 18 | Ht 71.0 in | Wt 211.9 lb

## 2023-04-08 DIAGNOSIS — Z8589 Personal history of malignant neoplasm of other organs and systems: Secondary | ICD-10-CM

## 2023-04-08 DIAGNOSIS — E119 Type 2 diabetes mellitus without complications: Secondary | ICD-10-CM | POA: Insufficient documentation

## 2023-04-08 DIAGNOSIS — Z79899 Other long term (current) drug therapy: Secondary | ICD-10-CM | POA: Diagnosis not present

## 2023-04-08 DIAGNOSIS — Z7984 Long term (current) use of oral hypoglycemic drugs: Secondary | ICD-10-CM | POA: Diagnosis not present

## 2023-04-08 DIAGNOSIS — Z794 Long term (current) use of insulin: Secondary | ICD-10-CM | POA: Insufficient documentation

## 2023-04-08 DIAGNOSIS — Z08 Encounter for follow-up examination after completed treatment for malignant neoplasm: Secondary | ICD-10-CM | POA: Diagnosis not present

## 2023-04-08 DIAGNOSIS — M2550 Pain in unspecified joint: Secondary | ICD-10-CM | POA: Diagnosis not present

## 2023-04-08 DIAGNOSIS — I10 Essential (primary) hypertension: Secondary | ICD-10-CM | POA: Insufficient documentation

## 2023-04-08 DIAGNOSIS — Z87891 Personal history of nicotine dependence: Secondary | ICD-10-CM | POA: Insufficient documentation

## 2023-04-08 DIAGNOSIS — R748 Abnormal levels of other serum enzymes: Secondary | ICD-10-CM | POA: Diagnosis not present

## 2023-04-08 DIAGNOSIS — K219 Gastro-esophageal reflux disease without esophagitis: Secondary | ICD-10-CM | POA: Diagnosis not present

## 2023-04-08 DIAGNOSIS — Z7901 Long term (current) use of anticoagulants: Secondary | ICD-10-CM | POA: Diagnosis not present

## 2023-04-08 DIAGNOSIS — Z79890 Hormone replacement therapy (postmenopausal): Secondary | ICD-10-CM | POA: Insufficient documentation

## 2023-04-08 DIAGNOSIS — R7401 Elevation of levels of liver transaminase levels: Secondary | ICD-10-CM | POA: Insufficient documentation

## 2023-04-08 DIAGNOSIS — Z79891 Long term (current) use of opiate analgesic: Secondary | ICD-10-CM | POA: Insufficient documentation

## 2023-04-08 DIAGNOSIS — Z86010 Personal history of colonic polyps: Secondary | ICD-10-CM | POA: Insufficient documentation

## 2023-04-08 DIAGNOSIS — I4891 Unspecified atrial fibrillation: Secondary | ICD-10-CM | POA: Diagnosis not present

## 2023-04-08 DIAGNOSIS — C109 Malignant neoplasm of oropharynx, unspecified: Secondary | ICD-10-CM | POA: Diagnosis present

## 2023-04-08 DIAGNOSIS — E039 Hypothyroidism, unspecified: Secondary | ICD-10-CM | POA: Diagnosis not present

## 2023-04-08 LAB — CMP (CANCER CENTER ONLY)
ALT: 193 U/L — ABNORMAL HIGH (ref 0–44)
AST: 225 U/L (ref 15–41)
Albumin: 3.9 g/dL (ref 3.5–5.0)
Alkaline Phosphatase: 92 U/L (ref 38–126)
Anion gap: 9 (ref 5–15)
BUN: 18 mg/dL (ref 8–23)
CO2: 28 mmol/L (ref 22–32)
Calcium: 9.2 mg/dL (ref 8.9–10.3)
Chloride: 102 mmol/L (ref 98–111)
Creatinine: 0.98 mg/dL (ref 0.61–1.24)
GFR, Estimated: >60 mL/min (ref >60)
Glucose, Bld: 121 mg/dL — ABNORMAL HIGH (ref 70–99)
Potassium: 3.9 mmol/L (ref 3.5–5.1)
Sodium: 139 mmol/L (ref 135–145)
Total Bilirubin: 0.8 mg/dL (ref 0.3–1.2)
Total Protein: 6.8 g/dL (ref 6.5–8.1)

## 2023-04-08 LAB — CBC (CANCER CENTER ONLY)
HCT: 40.9 % (ref 39.0–52.0)
Hemoglobin: 13.4 g/dL (ref 13.0–17.0)
MCH: 29.7 pg (ref 26.0–34.0)
MCHC: 32.8 g/dL (ref 30.0–36.0)
MCV: 90.7 fL (ref 80.0–100.0)
Platelet Count: 183 10*3/uL (ref 150–400)
RBC: 4.51 MIL/uL (ref 4.22–5.81)
RDW: 13.2 % (ref 11.5–15.5)
WBC Count: 5.1 10*3/uL (ref 4.0–10.5)
nRBC: 0 % (ref 0.0–0.2)

## 2023-04-08 LAB — TSH: TSH: 1.027 u[IU]/mL (ref 0.350–4.500)

## 2023-04-08 NOTE — Progress Notes (Signed)
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 Multiple right-sided lymph nodes at level IIA and 2B between 1.5-1.8 cm in size which were suspicious in appearance.also noted to have started asymmetric soft tissue fullness in the oropharynx or on the right lateral wall in the right palatine tonsil region measuring up to 2.7 cm.  PET scans at the end of treatment showed no residual or recurrent disease and he has remained in remission since then    Interval history-he is doing well presently.  He has been having more joint pains and has been taking hydrocodone tablets daily.  ECOG PS- 0 Pain scale- 3 Opioid associated constipation- no  Review of systems- Review of Systems  Constitutional:  Negative for chills, fever, malaise/fatigue and weight loss.  HENT:  Negative for congestion, ear discharge and nosebleeds.   Eyes:  Negative for blurred vision.  Respiratory:  Negative for cough, hemoptysis, sputum production, shortness of breath and wheezing.   Cardiovascular:  Negative for  chest pain, palpitations, orthopnea and claudication.  Gastrointestinal:  Negative for abdominal pain, blood in stool, constipation, diarrhea, heartburn, melena, nausea and vomiting.  Genitourinary:  Negative for dysuria, flank pain, frequency, hematuria and urgency.  Musculoskeletal:  Positive for joint pain. Negative for back pain and myalgias.  Skin:  Negative for rash.  Neurological:  Negative for dizziness, tingling, focal weakness, seizures, weakness and headaches.  Endo/Heme/Allergies:  Does not bruise/bleed easily.  Psychiatric/Behavioral:  Negative for depression and suicidal ideas. The patient does not have insomnia.       Allergies  Allergen Reactions   Biaxin [Clarithromycin] Other (See Comments)    Severe hiccups   Ceftin [Cefuroxime Axetil] Other (See Comments)    Severe hiccups   Penicillins Hives    Has patient had a PCN reaction causing immediate rash, facial/tongue/throat swelling, SOB or lightheadedness with hypotension: No Has patient had a PCN reaction causing severe rash involving mucus membranes or skin necrosis: unknown Has patient had a PCN reaction that required hospitalization: No Has patient had a PCN reaction occurring within the last 10 years: No If all of the above answers are "NO", then may proceed with Cephalosporin use.      Past Medical History:  Diagnosis Date   Anxiety    Arthritis    Cancer (HCC) 08/21/2017   hpv   Chronic pain 1996   due to work injury   Chronic prescription opiate use    Colon polyp    adenomatous polyp ascending colon   Cough    THAT WONT GO AWAY-PT TO SEE DR Judithann Sheen ON 06-10-17 @ 4 PM   CTS (carpal tunnel syndrome)    bilateral   Depression    Diabetes mellitus without  complication (HCC)    Dysrhythmia    AFIB X 1   GERD (gastroesophageal reflux disease)    Herniated lumbar intervertebral disc    Hypertension    Hypothyroidism    Long-term current use of testosterone cypionate    Neck pain    Neuromuscular  disorder (HCC)    Presence of intrathecal pump    Dr. Roderic Ovens in [place], [facility]   Vertigo    Avg: 2x/month     Past Surgical History:  Procedure Laterality Date   CARPAL TUNNEL RELEASE Left 09/25/2020   Procedure: CARPAL TUNNEL RELEASE;  Surgeon: Donato Heinz, MD;  Location: [facility];  Service: Orthopedics;  Laterality: Left;   CARPAL TUNNEL RELEASE Right 11/27/2020   Procedure: CARPAL TUNNEL RELEASE;  Surgeon: Donato Heinz, MD;  Location: [facility];  Service: Orthopedics;  Laterality: Right;   CATARACT EXTRACTION W/PHACO Right 05/30/2020   Procedure: CATARACT EXTRACTION PHACO AND INTRAOCULAR LENS PLACEMENT (IOC) RIGHT DIABETIC 2.83 00:35.8;  Surgeon: Galen Manila, MD;  Location: [facility];  Service: Ophthalmology;  Laterality: Right;  Diabetic - insulin and oral meds   COLON SURGERY     COLONOSCOPY  2013   [place]   COLONOSCOPY WITH PROPOFOL N/A 04/22/2017   Procedure: COLONOSCOPY WITH PROPOFOL;  Surgeon: Christena Deem, MD;  Location: [facility];  Service: Endoscopy;  Laterality: N/A;   COLONOSCOPY WITH PROPOFOL N/A 01/12/2021   Procedure: COLONOSCOPY WITH PROPOFOL;  Surgeon: Earline Mayotte, MD;  Location: [facility] ENDOSCOPY;  Service: General;  Laterality: N/A;   HERNIA REPAIR     IMAGE GUIDED SINUS SURGERY     implanted morphine pump  2016   IR IMAGING GUIDED PORT INSERTION  09/26/2017   IR REMOVAL TUN ACCESS W/ PORT W/O FL MOD SED  02/11/2018   LAPAROSCOPIC CHOLECYSTECTOMY  2013   PARTIAL COLECTOMY Right 06/17/2017   Procedure: PARTIAL COLECTOMY;  Surgeon: Kieth Brightly, MD;  Location: [facility];  Service: General;  Laterality: Right;   UMBILICAL HERNIA REPAIR  2013   UMBILICAL HERNIA REPAIR  06/17/2017   Procedure: HERNIA REPAIR UMBILICAL ADULT;  Surgeon: Kieth Brightly, MD;  Location: [facility];  Service: General;;   VASECTOMY      Social History   Socioeconomic History   Marital status: Married    Spouse name: Not  on file   Number of children: Not on file   Years of education: Not on file   Highest education level: Not on file  Occupational History   Not on file  Tobacco Use   Smoking status: Former    Years: 20    Types: Cigarettes    Quit date: 12/03/1999    Years since quitting: 23.3   Smokeless tobacco: Never  Vaping Use   Vaping Use: Never used  Substance and Sexual Activity   Alcohol use: No   Drug use: No   Sexual activity: Yes  Other Topics Concern   Not on file  Social History Narrative   Not on file   Social Determinants of Health   Financial Resource Strain: Not on file  Food Insecurity: Not on file  Transportation Needs: Not on file  Physical Activity: Not on file  Stress: Not on file  Social Connections: Not on file  Intimate Partner Violence: Not on file    Family History  Problem Relation Age of Onset   Breast cancer Cousin 71   Cancer - Colon Neg Hx    Prostate cancer Neg Hx  Current Outpatient Medications:    apixaban (ELIQUIS) 5 MG TABS tablet, Take 5 mg by mouth 2 (two) times daily. , Disp: , Rfl:    citalopram (CELEXA) 40 MG tablet, Take 40 mg by mouth every morning. , Disp: , Rfl:    Continuous Blood Gluc Sensor (FREESTYLE LIBRE 14 DAY SENSOR) MISC, USE 1 KIT UTD, Disp: , Rfl: 11   cyclobenzaprine (FLEXERIL) 10 MG tablet, Take 10 mg by mouth at bedtime., Disp: , Rfl:    diltiazem (TIAZAC) 120 MG 24 hr capsule, Take 1 capsule by mouth 2 (two) times daily., Disp: , Rfl:    fluticasone (FLONASE) 50 MCG/ACT nasal spray, Place 2 sprays into both nostrils daily as needed for allergies., Disp: , Rfl:    [START ON 04/19/2023] HYDROcodone-acetaminophen (NORCO) 7.5-325 MG tablet, Take by mouth., Disp: , Rfl:    Insulin Glargine (BASAGLAR KWIKPEN) 100 UNIT/ML SOPN, Inject 60 Units into the skin at bedtime., Disp: , Rfl:    Insulin Pen Needle 32G X 4 MM MISC, USE EVERY DAY AS NEEDED, Disp: , Rfl:    lisinopril (ZESTRIL) 10 MG tablet, Take 1 tablet by mouth daily.,  Disp: , Rfl:    metFORMIN (GLUCOPHAGE) 500 MG tablet, Take 500 mg by mouth 2 (two) times daily with a meal., Disp: , Rfl:    MORPHINE SULFATE IJ, Inject as directed. Morphine 4mg /ml epidural pump. 1.4897 mg/day, Disp: , Rfl:    Naloxone HCl (KLOXXADO) 8 MG/0.1ML LIQD, Place into the nose., Disp: , Rfl:    oxymetazoline (AFRIN) 0.05 % nasal spray, Place 1 spray into both nostrils 2 (two) times daily as needed for congestion., Disp: , Rfl:    OZEMPIC, 2 MG/DOSE, 8 MG/3ML SOPN, Inject 2 mg into the skin once a week., Disp: , Rfl:    pregabalin (LYRICA) 150 MG capsule, Take 150 mg by mouth 2 (two) times daily., Disp: , Rfl:    testosterone (ANDROGEL) 50 MG/5GM (1%) GEL, SMARTSIG:2 Packet(s) Topical Daily, Disp: , Rfl:    testosterone cypionate (DEPOTESTOSTERONE CYPIONATE) 200 MG/ML injection, Inject 1 mL (200 mg total) into the muscle every 14 (fourteen) days. (Patient taking differently: Inject 200 mg into the muscle every 21 ( twenty-one) days.), Disp: 3 mL, Rfl: 0   tiZANidine (ZANAFLEX) 4 MG capsule, Take by mouth., Disp: , Rfl:    traMADol (ULTRAM-ER) 300 MG 24 hr tablet, Take 300 mg by mouth at bedtime. , Disp: , Rfl:    traZODone (DESYREL) 50 MG tablet, Take 50 mg by mouth at bedtime., Disp: , Rfl:  No current facility-administered medications for this visit.  Facility-Administered Medications Ordered in Other Visits:    0.9 %  sodium chloride infusion, , Intravenous, Continuous, Burns, Jennifer E, NP, Last Rate: 999 mL/hr at 01/12/21 0805, Continued from Pre-op at 01/12/21 0805   heparin lock flush 100 unit/mL, 500 Units, Intravenous, Once, Creig Hines, MD  Physical exam:  Vitals:   04/08/23 1433  BP: 102/70  Pulse: 76  Resp: 18  Temp: 97.7 F (36.5 C)  TempSrc: Tympanic  SpO2: 97%  Weight: 211 lb 14.4 oz (96.1 kg)  Height: 5\' 11"  (1.803 m)   Physical Exam HENT:     Mouth/Throat:     Mouth: Mucous membranes are moist.     Pharynx: Oropharynx is clear.  Cardiovascular:      Rate and Rhythm: Normal rate and regular rhythm.     Heart sounds: Normal heart sounds.  Pulmonary:     Effort: Pulmonary effort is  normal.     Breath sounds: Normal breath sounds.  Abdominal:     General: Bowel sounds are normal.     Palpations: Abdomen is soft.  Lymphadenopathy:     Comments: No palpable cervical adenopathy  Skin:    General: Skin is warm and dry.  Neurological:     Mental Status: He is alert and oriented to person, place, and time.         Latest Ref Rng & Units 04/08/2023    2:13 PM  CMP  Glucose 70 - 99 mg/dL 161   BUN 8 - 23 mg/dL 18   Creatinine 0.96 - 1.24 mg/dL 0.45   Sodium 409 - 811 mmol/L 139   Potassium 3.5 - 5.1 mmol/L 3.9   Chloride 98 - 111 mmol/L 102   CO2 22 - 32 mmol/L 28   Calcium 8.9 - 10.3 mg/dL 9.2   Total Protein 6.5 - 8.1 g/dL 6.8   Total Bilirubin 0.3 - 1.2 mg/dL 0.8   Alkaline Phos 38 - 126 U/L 92   AST 15 - 41 U/L 225   ALT 0 - 44 U/L 193       Latest Ref Rng & Units 04/08/2023    2:13 PM  CBC  WBC 4.0 - 10.5 K/uL 5.1   Hemoglobin 13.0 - 17.0 g/dL 91.4   Hematocrit 78.2 - 52.0 % 40.9   Platelets 150 - 400 K/uL 183      Assessment and plan- Patient is a 67 y.o. male here for routine follow up of SCC oropharynx  Clinically patient is no concerning signs and symptoms of recurrence based on today's exam.  He will continue to follow-up with ENT to get yearly fiberoptic exams.  Since he is now more than 5 years from his diagnosis and does not require any follow-up with me.  On today's labs patientNoted to have an elevated AST and ALT of 225 and 193 respectively.  Total bilirubin is normal.  Have asked him to cut down his dose of hydrocodone.  We will repeat his LFTs again in 2 weeks.  Will remain abnormal I will refer him to GI and get a right upper quadrant ultrasound   Visit Diagnosis 1. Elevated liver enzymes   2. Encounter for follow-up surveillance of head and neck cancer      Dr. Owens Shark, MD, MPH [facility] at [facility] [phone] 04/08/2023 3:43 PM

## 2023-04-10 ENCOUNTER — Encounter: Payer: Self-pay | Admitting: Oncology

## 2023-04-22 ENCOUNTER — Inpatient Hospital Stay: Payer: Federal, State, Local not specified - PPO

## 2023-04-22 DIAGNOSIS — R748 Abnormal levels of other serum enzymes: Secondary | ICD-10-CM

## 2023-04-22 DIAGNOSIS — C109 Malignant neoplasm of oropharynx, unspecified: Secondary | ICD-10-CM | POA: Diagnosis not present

## 2023-04-22 LAB — COMPREHENSIVE METABOLIC PANEL
ALT: 60 U/L — ABNORMAL HIGH (ref 0–44)
AST: 23 U/L (ref 15–41)
Albumin: 4.2 g/dL (ref 3.5–5.0)
Alkaline Phosphatase: 76 U/L (ref 38–126)
Anion gap: 10 (ref 5–15)
BUN: 28 mg/dL — ABNORMAL HIGH (ref 8–23)
CO2: 26 mmol/L (ref 22–32)
Calcium: 9.3 mg/dL (ref 8.9–10.3)
Chloride: 101 mmol/L (ref 98–111)
Creatinine, Ser: 1.2 mg/dL (ref 0.61–1.24)
GFR, Estimated: >60 mL/min (ref >60)
Glucose, Bld: 132 mg/dL — ABNORMAL HIGH (ref 70–99)
Potassium: 4.4 mmol/L (ref 3.5–5.1)
Sodium: 137 mmol/L (ref 135–145)
Total Bilirubin: 0.9 mg/dL (ref 0.3–1.2)
Total Protein: 7.4 g/dL (ref 6.5–8.1)

## 2023-04-23 ENCOUNTER — Telehealth: Payer: Self-pay | Admitting: *Deleted

## 2023-04-23 NOTE — Telephone Encounter (Signed)
Called the pt. LFT's getting lower. I called the pt . He is happy with LFT's are going down. He does not need U/S or to see GI. Pt. pleased  Made appt for his recheck LFT on 7/22 at 2:00 and pt knows about it.

## 2023-06-19 ENCOUNTER — Other Ambulatory Visit: Payer: Self-pay | Admitting: *Deleted

## 2023-06-19 DIAGNOSIS — R748 Abnormal levels of other serum enzymes: Secondary | ICD-10-CM

## 2023-06-23 ENCOUNTER — Inpatient Hospital Stay: Payer: Federal, State, Local not specified - PPO

## 2023-06-24 ENCOUNTER — Inpatient Hospital Stay: Payer: Federal, State, Local not specified - PPO | Attending: Oncology

## 2023-06-24 DIAGNOSIS — Z85819 Personal history of malignant neoplasm of unspecified site of lip, oral cavity, and pharynx: Secondary | ICD-10-CM | POA: Insufficient documentation

## 2023-06-24 DIAGNOSIS — R748 Abnormal levels of other serum enzymes: Secondary | ICD-10-CM

## 2023-06-24 DIAGNOSIS — Z87891 Personal history of nicotine dependence: Secondary | ICD-10-CM | POA: Diagnosis not present

## 2023-06-24 LAB — COMPREHENSIVE METABOLIC PANEL
ALT: 62 U/L — ABNORMAL HIGH (ref 0–44)
AST: 23 U/L (ref 15–41)
Albumin: 3.7 g/dL (ref 3.5–5.0)
Alkaline Phosphatase: 55 U/L (ref 38–126)
Anion gap: 7 (ref 5–15)
BUN: 23 mg/dL (ref 8–23)
CO2: 25 mmol/L (ref 22–32)
Calcium: 9 mg/dL (ref 8.9–10.3)
Chloride: 105 mmol/L (ref 98–111)
Creatinine, Ser: 1.21 mg/dL (ref 0.61–1.24)
GFR, Estimated: >60 mL/min (ref >60)
Glucose, Bld: 196 mg/dL — ABNORMAL HIGH (ref 70–99)
Potassium: 4.5 mmol/L (ref 3.5–5.1)
Sodium: 137 mmol/L (ref 135–145)
Total Bilirubin: 0.6 mg/dL (ref 0.3–1.2)
Total Protein: 6.4 g/dL — ABNORMAL LOW (ref 6.5–8.1)
